# Patient Record
Sex: Female | Born: 1945 | Race: Black or African American | Hispanic: No | Smoking: Never smoker
Health system: Southern US, Community
[De-identification: ages and names within clinical notes are randomized; demographics above are authoritative.]

## PROBLEM LIST (undated history)

## (undated) DIAGNOSIS — E119 Type 2 diabetes mellitus without complications: Secondary | ICD-10-CM

## (undated) DIAGNOSIS — I10 Essential (primary) hypertension: Secondary | ICD-10-CM

## (undated) DIAGNOSIS — J45909 Unspecified asthma, uncomplicated: Secondary | ICD-10-CM

## (undated) DIAGNOSIS — I2510 Atherosclerotic heart disease of native coronary artery without angina pectoris: Secondary | ICD-10-CM

## (undated) DIAGNOSIS — N189 Chronic kidney disease, unspecified: Secondary | ICD-10-CM

## (undated) DIAGNOSIS — D631 Anemia in chronic kidney disease: Secondary | ICD-10-CM

## (undated) HISTORY — PX: ABDOMINAL HYSTERECTOMY: SHX81

## (undated) HISTORY — PX: BACK SURGERY: SHX140

---

## 2020-06-07 DEATH — deceased

## 2021-04-08 ENCOUNTER — Other Ambulatory Visit: Payer: Self-pay

## 2021-04-08 ENCOUNTER — Emergency Department (HOSPITAL_COMMUNITY): Payer: Medicare Other

## 2021-04-08 ENCOUNTER — Encounter (HOSPITAL_COMMUNITY): Payer: Self-pay

## 2021-04-08 ENCOUNTER — Inpatient Hospital Stay (HOSPITAL_COMMUNITY)
Admission: EM | Admit: 2021-04-08 | Discharge: 2021-04-13 | DRG: 291 | Disposition: A | Discharge status: Skilled Nursing Facility | Payer: Medicare Other | Attending: Family Medicine | Admitting: Family Medicine

## 2021-04-08 ENCOUNTER — Inpatient Hospital Stay (HOSPITAL_COMMUNITY): Payer: Medicare Other

## 2021-04-08 DIAGNOSIS — Z79899 Other long term (current) drug therapy: Secondary | ICD-10-CM

## 2021-04-08 DIAGNOSIS — I63421 Cerebral infarction due to embolism of right anterior cerebral artery: Secondary | ICD-10-CM | POA: Diagnosis not present

## 2021-04-08 DIAGNOSIS — R471 Dysarthria and anarthria: Secondary | ICD-10-CM | POA: Diagnosis present

## 2021-04-08 DIAGNOSIS — R54 Age-related physical debility: Secondary | ICD-10-CM | POA: Diagnosis present

## 2021-04-08 DIAGNOSIS — Z7901 Long term (current) use of anticoagulants: Secondary | ICD-10-CM

## 2021-04-08 DIAGNOSIS — W19XXXA Unspecified fall, initial encounter: Secondary | ICD-10-CM | POA: Diagnosis present

## 2021-04-08 DIAGNOSIS — D631 Anemia in chronic kidney disease: Secondary | ICD-10-CM | POA: Diagnosis present

## 2021-04-08 DIAGNOSIS — L89152 Pressure ulcer of sacral region, stage 2: Secondary | ICD-10-CM | POA: Diagnosis present

## 2021-04-08 DIAGNOSIS — I132 Hypertensive heart and chronic kidney disease with heart failure and with stage 5 chronic kidney disease, or end stage renal disease: Principal | ICD-10-CM | POA: Diagnosis present

## 2021-04-08 DIAGNOSIS — N184 Chronic kidney disease, stage 4 (severe): Secondary | ICD-10-CM

## 2021-04-08 DIAGNOSIS — D509 Iron deficiency anemia, unspecified: Secondary | ICD-10-CM | POA: Diagnosis present

## 2021-04-08 DIAGNOSIS — R1312 Dysphagia, oropharyngeal phase: Secondary | ICD-10-CM | POA: Diagnosis not present

## 2021-04-08 DIAGNOSIS — I5043 Acute on chronic combined systolic (congestive) and diastolic (congestive) heart failure: Secondary | ICD-10-CM | POA: Diagnosis present

## 2021-04-08 DIAGNOSIS — E872 Acidosis, unspecified: Secondary | ICD-10-CM | POA: Diagnosis present

## 2021-04-08 DIAGNOSIS — E1151 Type 2 diabetes mellitus with diabetic peripheral angiopathy without gangrene: Secondary | ICD-10-CM

## 2021-04-08 DIAGNOSIS — D6859 Other primary thrombophilia: Secondary | ICD-10-CM | POA: Diagnosis present

## 2021-04-08 DIAGNOSIS — I129 Hypertensive chronic kidney disease with stage 1 through stage 4 chronic kidney disease, or unspecified chronic kidney disease: Secondary | ICD-10-CM | POA: Diagnosis present

## 2021-04-08 DIAGNOSIS — N189 Chronic kidney disease, unspecified: Secondary | ICD-10-CM

## 2021-04-08 DIAGNOSIS — Z531 Procedure and treatment not carried out because of patient's decision for reasons of belief and group pressure: Secondary | ICD-10-CM | POA: Diagnosis present

## 2021-04-08 DIAGNOSIS — R0602 Shortness of breath: Secondary | ICD-10-CM

## 2021-04-08 DIAGNOSIS — I5031 Acute diastolic (congestive) heart failure: Secondary | ICD-10-CM | POA: Diagnosis not present

## 2021-04-08 DIAGNOSIS — E669 Obesity, unspecified: Secondary | ICD-10-CM | POA: Diagnosis present

## 2021-04-08 DIAGNOSIS — E785 Hyperlipidemia, unspecified: Secondary | ICD-10-CM | POA: Diagnosis present

## 2021-04-08 DIAGNOSIS — I509 Heart failure, unspecified: Secondary | ICD-10-CM | POA: Diagnosis not present

## 2021-04-08 DIAGNOSIS — R4182 Altered mental status, unspecified: Secondary | ICD-10-CM | POA: Diagnosis present

## 2021-04-08 DIAGNOSIS — D6869 Other thrombophilia: Secondary | ICD-10-CM

## 2021-04-08 DIAGNOSIS — E8770 Fluid overload, unspecified: Secondary | ICD-10-CM | POA: Diagnosis present

## 2021-04-08 DIAGNOSIS — N179 Acute kidney failure, unspecified: Secondary | ICD-10-CM | POA: Diagnosis present

## 2021-04-08 DIAGNOSIS — Z6833 Body mass index (BMI) 33.0-33.9, adult: Secondary | ICD-10-CM

## 2021-04-08 DIAGNOSIS — R2681 Unsteadiness on feet: Secondary | ICD-10-CM | POA: Diagnosis present

## 2021-04-08 DIAGNOSIS — G9341 Metabolic encephalopathy: Secondary | ICD-10-CM | POA: Diagnosis present

## 2021-04-08 DIAGNOSIS — Z20822 Contact with and (suspected) exposure to covid-19: Secondary | ICD-10-CM | POA: Diagnosis present

## 2021-04-08 DIAGNOSIS — J90 Pleural effusion, not elsewhere classified: Secondary | ICD-10-CM | POA: Diagnosis present

## 2021-04-08 DIAGNOSIS — I480 Paroxysmal atrial fibrillation: Secondary | ICD-10-CM | POA: Diagnosis present

## 2021-04-08 DIAGNOSIS — R7989 Other specified abnormal findings of blood chemistry: Secondary | ICD-10-CM

## 2021-04-08 DIAGNOSIS — J811 Chronic pulmonary edema: Secondary | ICD-10-CM

## 2021-04-08 DIAGNOSIS — E039 Hypothyroidism, unspecified: Secondary | ICD-10-CM | POA: Diagnosis present

## 2021-04-08 DIAGNOSIS — Z9889 Other specified postprocedural states: Secondary | ICD-10-CM

## 2021-04-08 DIAGNOSIS — I482 Chronic atrial fibrillation, unspecified: Secondary | ICD-10-CM | POA: Diagnosis present

## 2021-04-08 DIAGNOSIS — R627 Adult failure to thrive: Secondary | ICD-10-CM | POA: Diagnosis present

## 2021-04-08 DIAGNOSIS — E1122 Type 2 diabetes mellitus with diabetic chronic kidney disease: Secondary | ICD-10-CM | POA: Diagnosis present

## 2021-04-08 DIAGNOSIS — I5023 Acute on chronic systolic (congestive) heart failure: Secondary | ICD-10-CM

## 2021-04-08 DIAGNOSIS — L8990 Pressure ulcer of unspecified site, unspecified stage: Secondary | ICD-10-CM | POA: Insufficient documentation

## 2021-04-08 DIAGNOSIS — E860 Dehydration: Secondary | ICD-10-CM | POA: Diagnosis present

## 2021-04-08 DIAGNOSIS — J45909 Unspecified asthma, uncomplicated: Secondary | ICD-10-CM | POA: Diagnosis present

## 2021-04-08 DIAGNOSIS — G8194 Hemiplegia, unspecified affecting left nondominant side: Secondary | ICD-10-CM | POA: Diagnosis not present

## 2021-04-08 DIAGNOSIS — G4700 Insomnia, unspecified: Secondary | ICD-10-CM | POA: Diagnosis present

## 2021-04-08 DIAGNOSIS — Z7189 Other specified counseling: Secondary | ICD-10-CM | POA: Diagnosis not present

## 2021-04-08 DIAGNOSIS — N185 Chronic kidney disease, stage 5: Secondary | ICD-10-CM | POA: Diagnosis present

## 2021-04-08 DIAGNOSIS — J9601 Acute respiratory failure with hypoxia: Secondary | ICD-10-CM

## 2021-04-08 DIAGNOSIS — Z8673 Personal history of transient ischemic attack (TIA), and cerebral infarction without residual deficits: Secondary | ICD-10-CM

## 2021-04-08 DIAGNOSIS — I160 Hypertensive urgency: Secondary | ICD-10-CM | POA: Diagnosis present

## 2021-04-08 DIAGNOSIS — Z515 Encounter for palliative care: Secondary | ICD-10-CM | POA: Diagnosis not present

## 2021-04-08 DIAGNOSIS — Z955 Presence of coronary angioplasty implant and graft: Secondary | ICD-10-CM

## 2021-04-08 DIAGNOSIS — I2510 Atherosclerotic heart disease of native coronary artery without angina pectoris: Secondary | ICD-10-CM | POA: Diagnosis present

## 2021-04-08 DIAGNOSIS — Z794 Long term (current) use of insulin: Secondary | ICD-10-CM

## 2021-04-08 DIAGNOSIS — Z823 Family history of stroke: Secondary | ICD-10-CM

## 2021-04-08 DIAGNOSIS — R29706 NIHSS score 6: Secondary | ICD-10-CM | POA: Diagnosis not present

## 2021-04-08 DIAGNOSIS — E8809 Other disorders of plasma-protein metabolism, not elsewhere classified: Secondary | ICD-10-CM | POA: Diagnosis present

## 2021-04-08 HISTORY — DX: Essential (primary) hypertension: I10

## 2021-04-08 HISTORY — DX: Chronic kidney disease, unspecified: N18.9

## 2021-04-08 HISTORY — DX: Type 2 diabetes mellitus without complications: E11.9

## 2021-04-08 HISTORY — DX: Anemia in chronic kidney disease: D63.1

## 2021-04-08 HISTORY — DX: Atherosclerotic heart disease of native coronary artery without angina pectoris: I25.10

## 2021-04-08 HISTORY — DX: Unspecified asthma, uncomplicated: J45.909

## 2021-04-08 LAB — CBC WITH DIFFERENTIAL/PLATELET
Abs Immature Granulocytes: 0.01 10*3/uL (ref 0.00–0.07)
Basophils Absolute: 0 10*3/uL (ref 0.0–0.1)
Basophils Relative: 0 %
Eosinophils Absolute: 0.1 10*3/uL (ref 0.0–0.5)
Eosinophils Relative: 2 %
HCT: 28.8 % — ABNORMAL LOW (ref 36.0–46.0)
Hemoglobin: 8.3 g/dL — ABNORMAL LOW (ref 12.0–15.0)
Immature Granulocytes: 0 %
Lymphocytes Relative: 27 %
Lymphs Abs: 1.5 10*3/uL (ref 0.7–4.0)
MCH: 26.7 pg (ref 26.0–34.0)
MCHC: 28.8 g/dL — ABNORMAL LOW (ref 30.0–36.0)
MCV: 92.6 fL (ref 80.0–100.0)
Monocytes Absolute: 0.3 10*3/uL (ref 0.1–1.0)
Monocytes Relative: 6 %
Neutro Abs: 3.6 10*3/uL (ref 1.7–7.7)
Neutrophils Relative %: 65 %
Platelets: 153 10*3/uL (ref 150–400)
RBC: 3.11 MIL/uL — ABNORMAL LOW (ref 3.87–5.11)
RDW: 17.9 % — ABNORMAL HIGH (ref 11.5–15.5)
WBC: 5.5 10*3/uL (ref 4.0–10.5)
nRBC: 0 % (ref 0.0–0.2)

## 2021-04-08 LAB — COMPREHENSIVE METABOLIC PANEL
ALT: 27 U/L (ref 0–44)
AST: 24 U/L (ref 15–41)
Albumin: 2.7 g/dL — ABNORMAL LOW (ref 3.5–5.0)
Alkaline Phosphatase: 83 U/L (ref 38–126)
Anion gap: 7 (ref 5–15)
BUN: 50 mg/dL — ABNORMAL HIGH (ref 8–23)
CO2: 18 mmol/L — ABNORMAL LOW (ref 22–32)
Calcium: 7.6 mg/dL — ABNORMAL LOW (ref 8.9–10.3)
Chloride: 111 mmol/L (ref 98–111)
Creatinine, Ser: 6.41 mg/dL — ABNORMAL HIGH (ref 0.44–1.00)
GFR, Estimated: 6 mL/min — ABNORMAL LOW (ref >60)
Glucose, Bld: 192 mg/dL — ABNORMAL HIGH (ref 70–99)
Potassium: 3.7 mmol/L (ref 3.5–5.1)
Sodium: 136 mmol/L (ref 135–145)
Total Bilirubin: 0.6 mg/dL (ref 0.3–1.2)
Total Protein: 7.8 g/dL (ref 6.5–8.1)

## 2021-04-08 LAB — RESP PANEL BY RT-PCR (FLU A&B, COVID) ARPGX2
Influenza A by PCR: NEGATIVE
Influenza B by PCR: NEGATIVE
SARS Coronavirus 2 by RT PCR: NEGATIVE

## 2021-04-08 LAB — IRON AND TIBC
Iron: 24 ug/dL — ABNORMAL LOW (ref 28–170)
Saturation Ratios: 13 % (ref 10.4–31.8)
TIBC: 180 ug/dL — ABNORMAL LOW (ref 250–450)
UIBC: 156 ug/dL

## 2021-04-08 LAB — GLUCOSE, CAPILLARY: Glucose-Capillary: 180 mg/dL — ABNORMAL HIGH (ref 70–99)

## 2021-04-08 LAB — URINALYSIS, ROUTINE W REFLEX MICROSCOPIC
Bilirubin Urine: NEGATIVE
Glucose, UA: 150 mg/dL — AB
Ketones, ur: NEGATIVE mg/dL
Leukocytes,Ua: NEGATIVE
Nitrite: NEGATIVE
Protein, ur: >=300 mg/dL — AB
Specific Gravity, Urine: 1.014 (ref 1.005–1.030)
pH: 6 (ref 5.0–8.0)

## 2021-04-08 LAB — FERRITIN: Ferritin: 198 ng/mL (ref 11–307)

## 2021-04-08 LAB — RETICULOCYTES
Immature Retic Fract: 12.6 % (ref 2.3–15.9)
RBC.: 3.13 MIL/uL — ABNORMAL LOW (ref 3.87–5.11)
Retic Count, Absolute: 51 10*3/uL (ref 19.0–186.0)
Retic Ct Pct: 1.6 % (ref 0.4–3.1)

## 2021-04-08 LAB — T4, FREE: Free T4: 1.02 ng/dL (ref 0.61–1.12)

## 2021-04-08 LAB — VITAMIN B12: Vitamin B-12: 939 pg/mL — ABNORMAL HIGH (ref 180–914)

## 2021-04-08 LAB — FOLATE: Folate: 10.7 ng/mL (ref >5.9)

## 2021-04-08 LAB — CBG MONITORING, ED: Glucose-Capillary: 121 mg/dL — ABNORMAL HIGH (ref 70–99)

## 2021-04-08 LAB — TROPONIN I (HIGH SENSITIVITY): Troponin I (High Sensitivity): 14 ng/L (ref <18)

## 2021-04-08 LAB — VITAMIN D 25 HYDROXY (VIT D DEFICIENCY, FRACTURES): Vit D, 25-Hydroxy: 104.26 ng/mL — ABNORMAL HIGH (ref 30–100)

## 2021-04-08 LAB — TSH: TSH: 9.838 u[IU]/mL — ABNORMAL HIGH (ref 0.350–4.500)

## 2021-04-08 LAB — BRAIN NATRIURETIC PEPTIDE: B Natriuretic Peptide: 2269 pg/mL — ABNORMAL HIGH (ref 0.0–100.0)

## 2021-04-08 IMAGING — DX DG CHEST 1V
1 series · 1 of 1 positions shown · non-contrast
Comparison: None.

CLINICAL DATA: Fell this morning. Low O2 sats. Confusion for 2
days.

EXAM:
CHEST  1 VIEW

[chest ap]
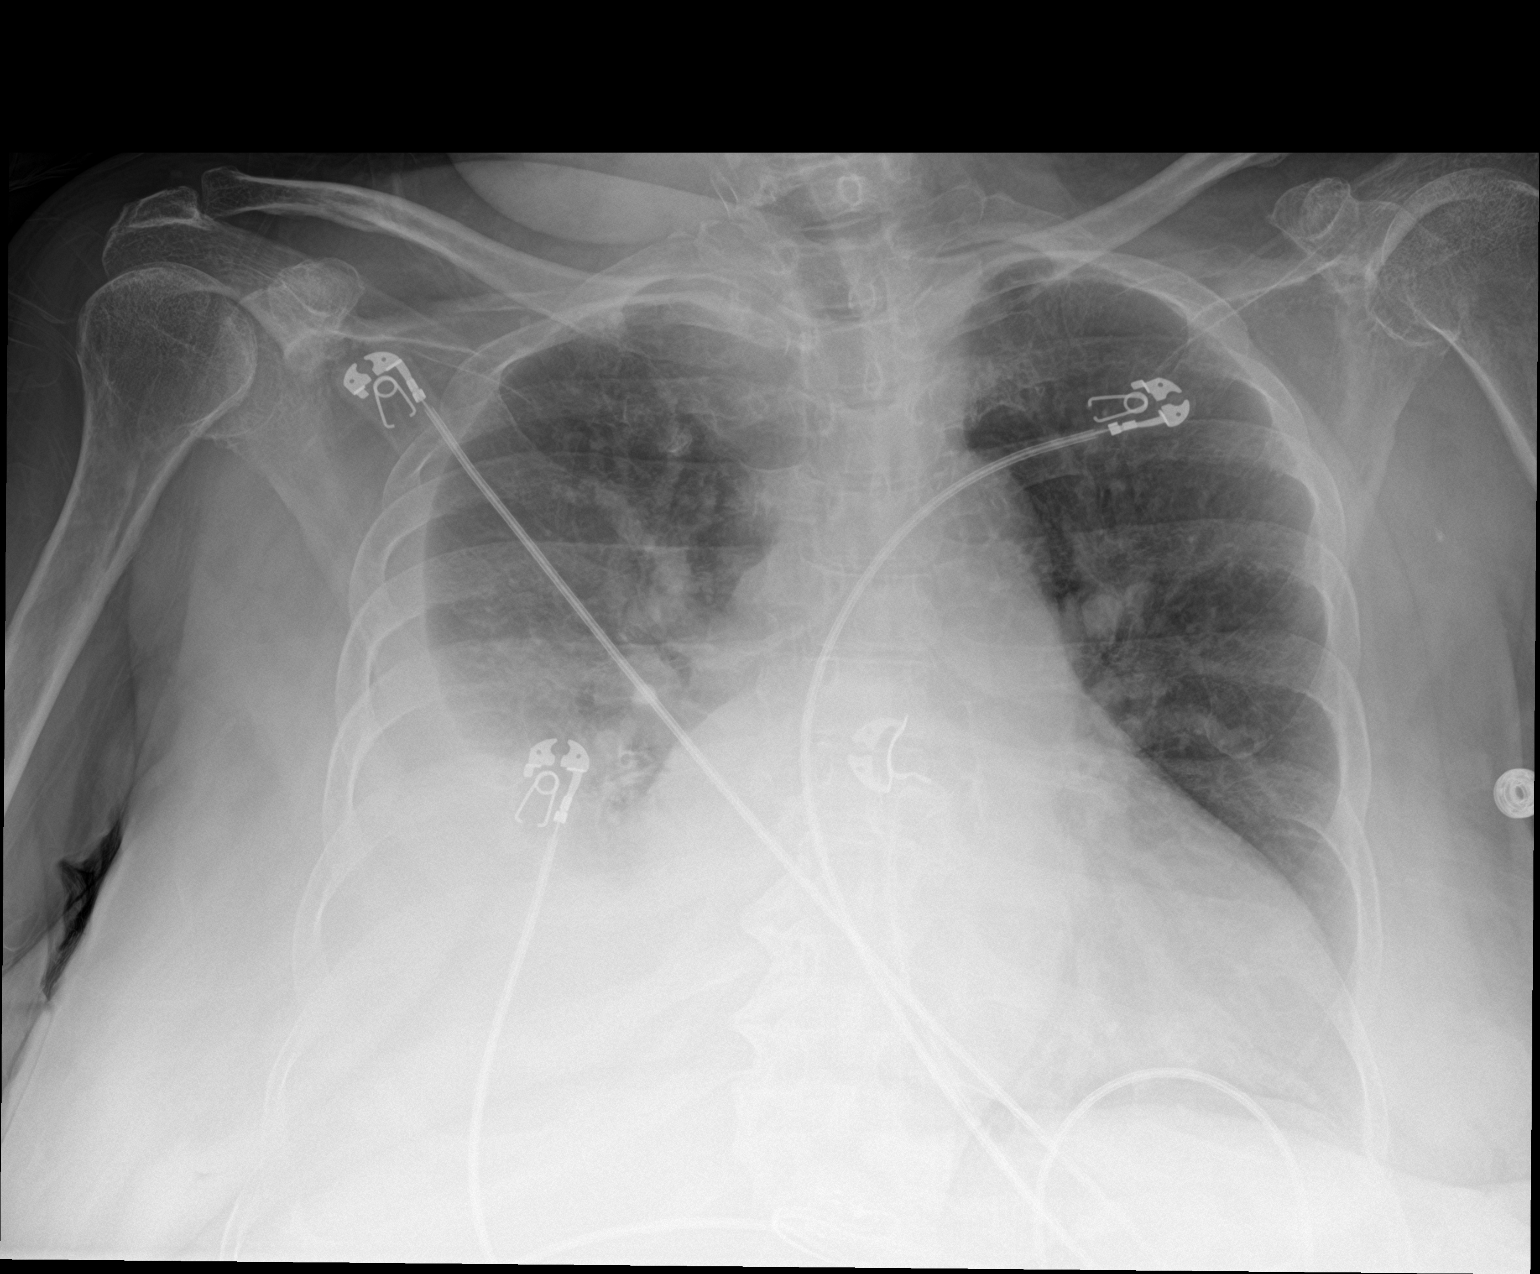

[1 of 1 positions shown; findings below may reference images not displayed]

FINDINGS: Heart is enlarged but also accentuated by technique. Moderate RIGHT
pleural effusion is noted, with opacification of the RIGHT lung base
which obscures the RIGHT hemidiaphragm. Pulmonary vascular
congestion is present. LEFT lung is otherwise clear. No
pneumothorax. No acute displaced fractures.
IMPRESSION: Cardiomegaly and pulmonary vascular congestion.

RIGHT pleural effusion and RIGHT LOWER lobe opacity consistent with
atelectasis or infiltrate.

## 2021-04-08 IMAGING — CT CT HEAD W/O CM
3 series · 16 of 47 positions shown, 19 images · non-contrast
Comparison: None.

CLINICAL DATA: Mental status change.

EXAM:
CT HEAD WITHOUT CONTRAST
TECHNIQUE: Contiguous axial images were obtained from the base of the skull
through the vertex without intravenous contrast.

[Series 2: head w o · axial · 0.43mm/px · z∈[-76,+49]mm · 10 of 31 slices shown, 13 images]
[im 3/31  brain]
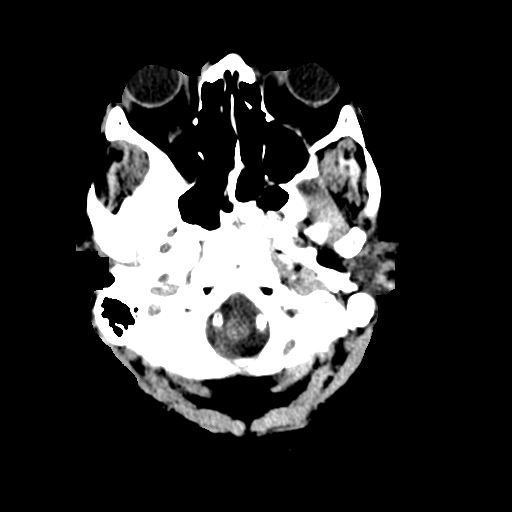
[im 3/31  bone]
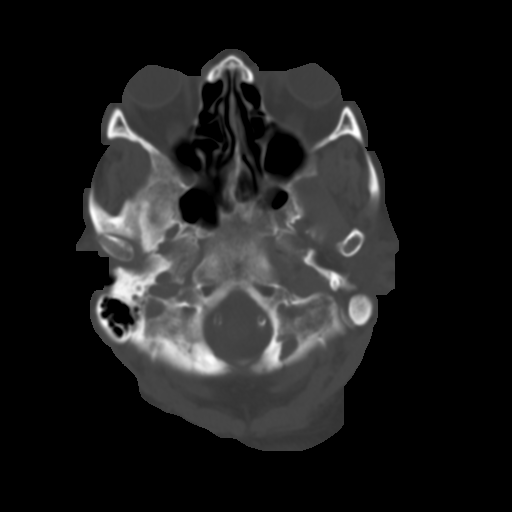
[im 6/31  brain]
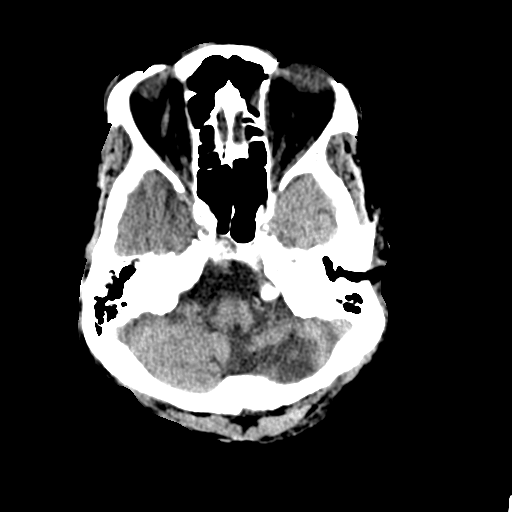
[im 9/31  brain]
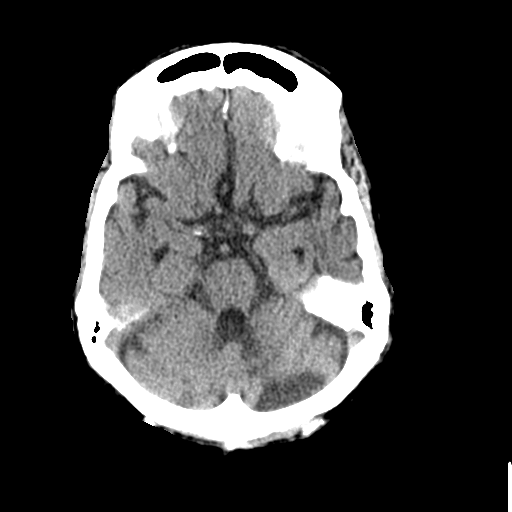
[im 11/31  brain]
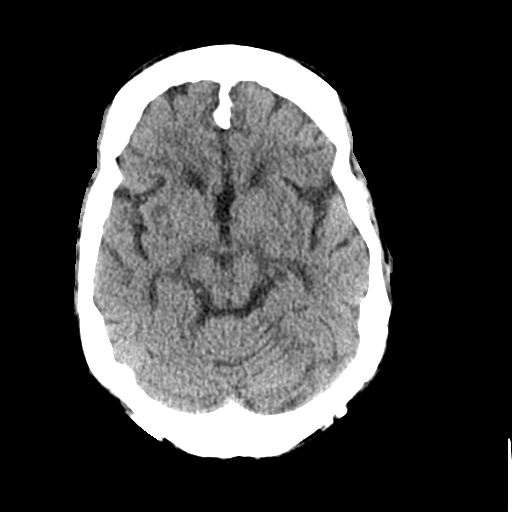
[im 14/31  brain]
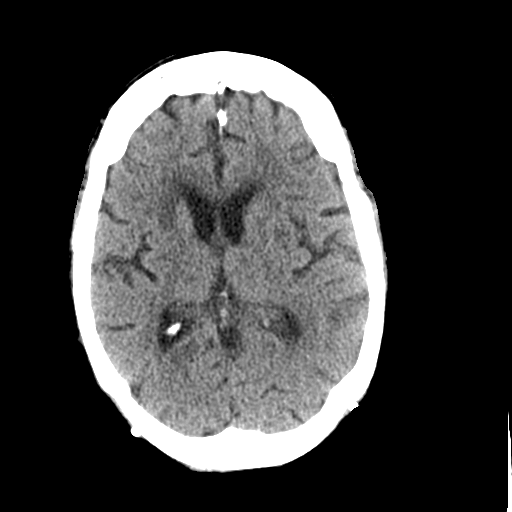
[im 14/31  bone]
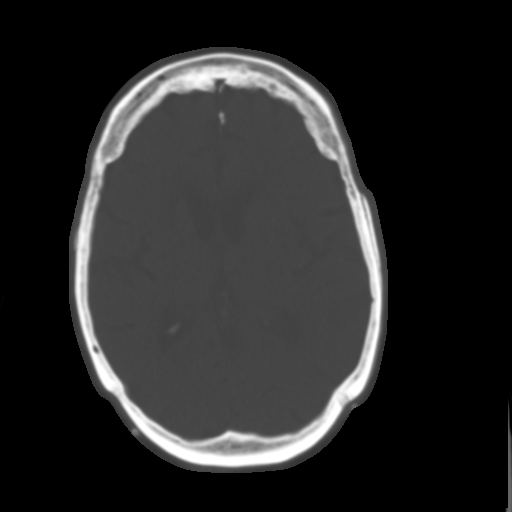
[im 17/31  brain]
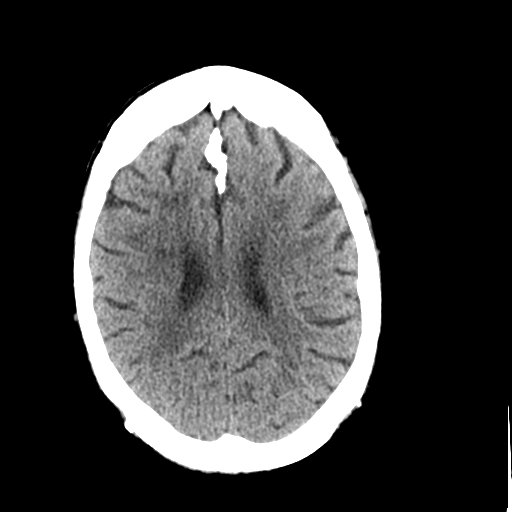
[im 20/31  brain]
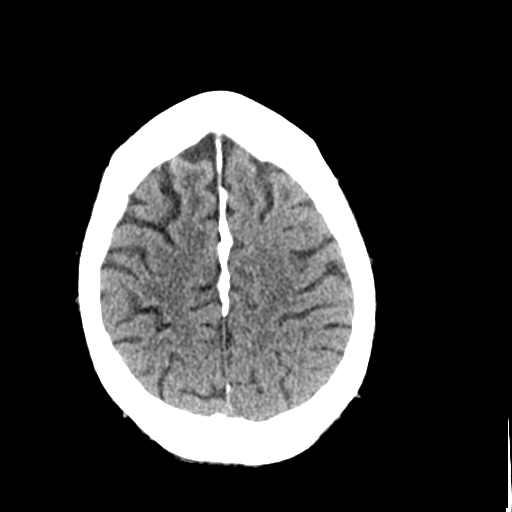
[im 23/31  brain]
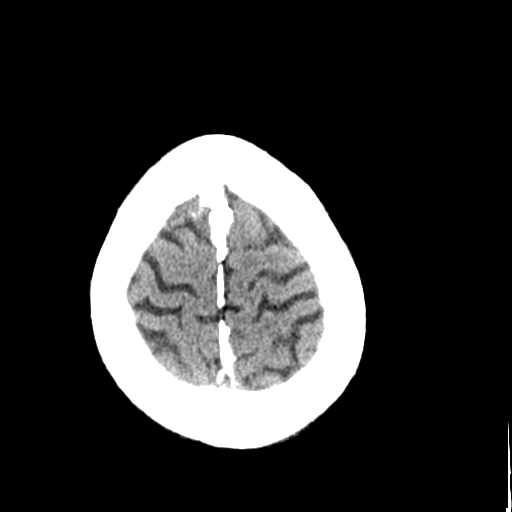
[im 25/31  brain]
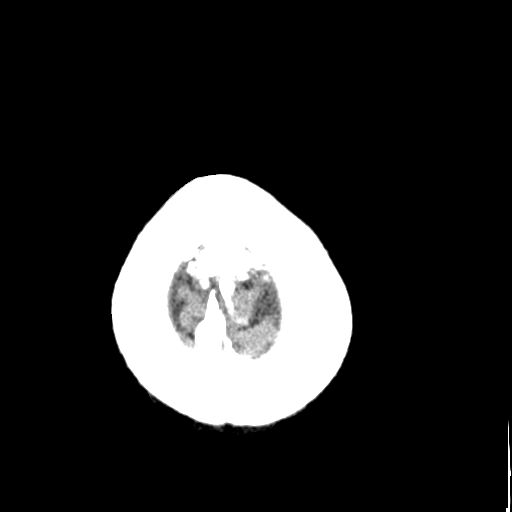
[im 25/31  bone]
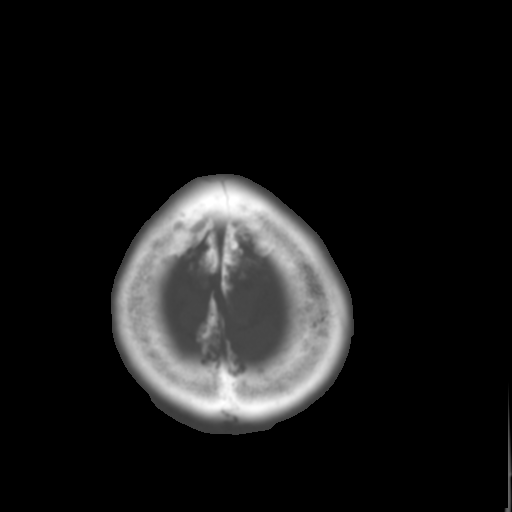
[im 28/31  brain]
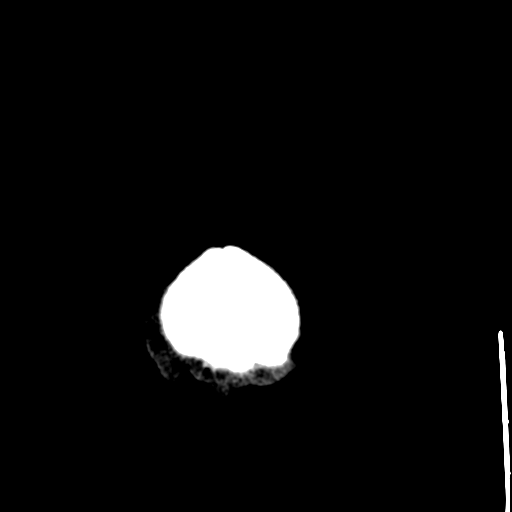

[Series 4: coronal soft · coronal · 0.30mm/px · 3 of 67 slices shown]
[im 23/67  brain]
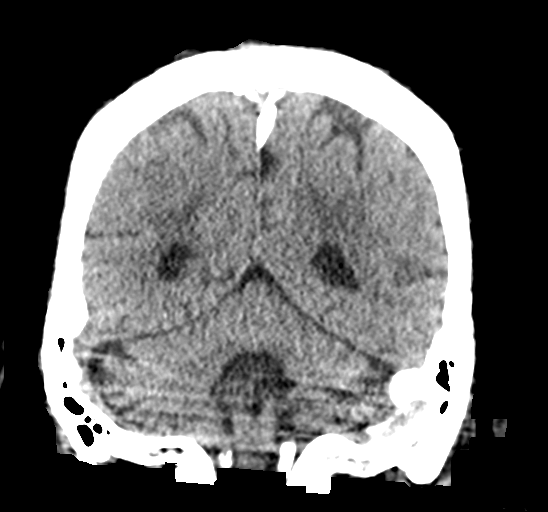
[im 30/67  brain]
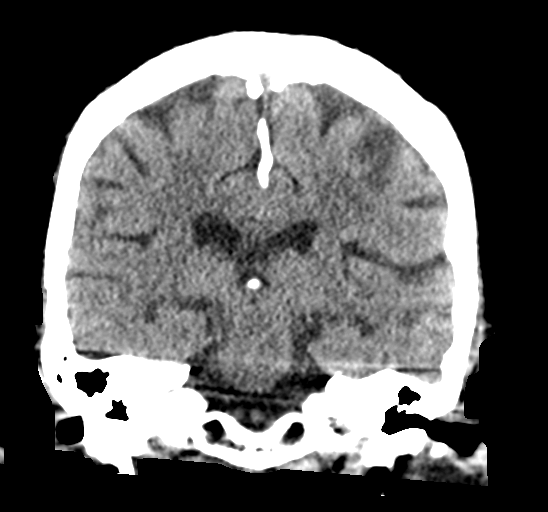
[im 37/67  brain]
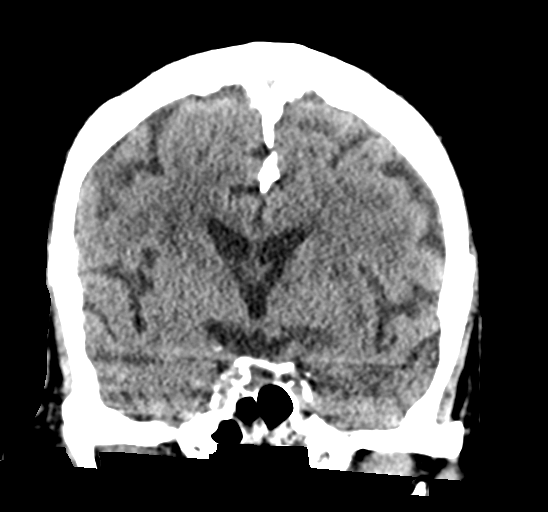

[Series 5: sagittal soft · sagittal · 0.33mm/px · 3 of 55 slices shown]
[im 19/55  brain]
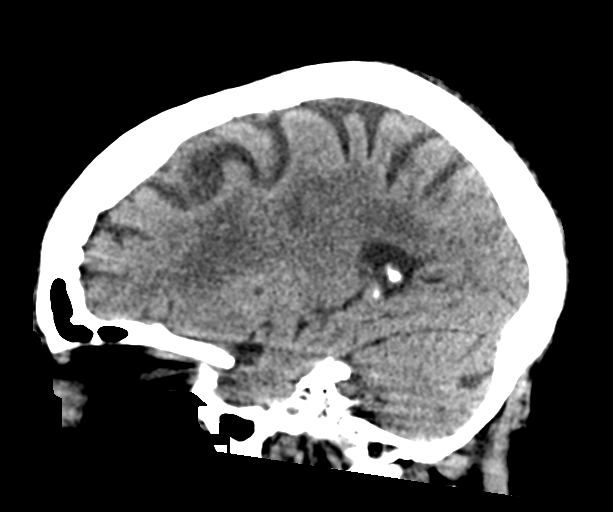
[im 28/55  brain]
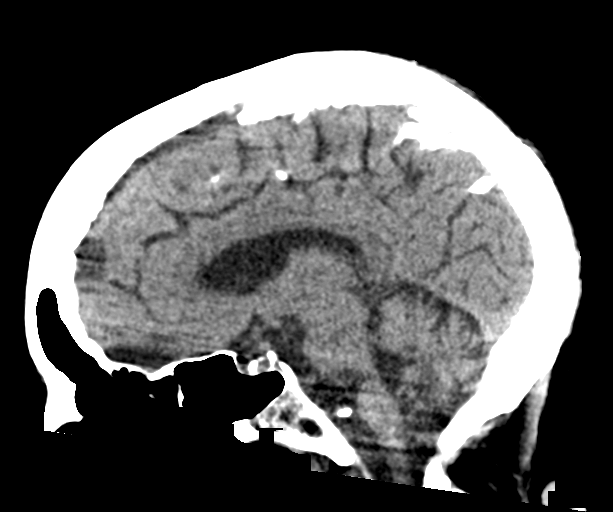
[im 37/55  brain]
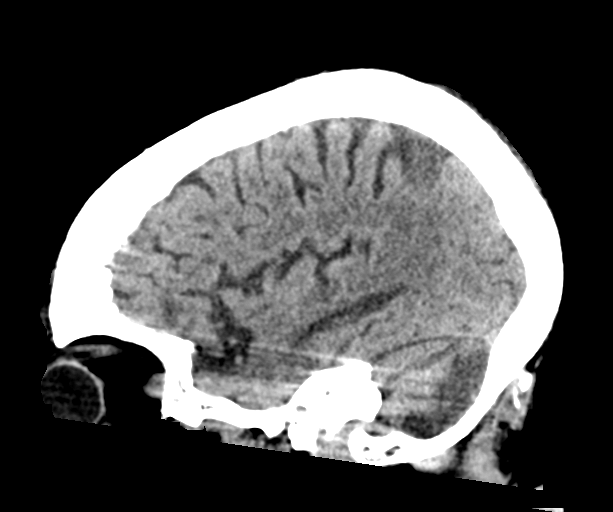

[16 of 47 positions shown; findings below may reference images not displayed]

FINDINGS: Brain: Ventricles and sulci are prominent compatible with atrophy.
Periventricular and subcortical white matter hypodensities
compatible with chronic microvascular ischemic changes.
Low-attenuation throughout the majority of the left cerebellar
hemisphere. Focal chronic infarct within the right cerebellar
hemisphere. No evidence for acute intracranial hemorrhage or
significant mass effect.

Vascular: Unremarkable.

Skull: Intact.

Sinuses/Orbits: Paranasal sinuses are well aerated. Mastoid air
cells are unremarkable.

Other: None.
IMPRESSION: Low-attenuation within the majority of the left cerebellar
hemisphere may represent subacute to chronic infarct, in the absence
of priors. Consider further evaluation with MRI as clinically
indicated.

Atrophy and chronic microvascular ischemic changes.

## 2021-04-08 MED ORDER — MIRTAZAPINE 15 MG PO TABS
7.5000 mg | ORAL_TABLET | Freq: Every day | ORAL | Status: DC
  Administered 2021-04-08 – 2021-04-12 (×5): 7.5 mg via ORAL
  Filled 2021-04-08 (×5): qty 1

## 2021-04-08 MED ORDER — HEPARIN SODIUM (PORCINE) 5000 UNIT/ML IJ SOLN: 5000.0000 [IU] | Freq: Three times a day (TID) | INTRAMUSCULAR | Status: DC

## 2021-04-08 MED ORDER — ALBUTEROL SULFATE (2.5 MG/3ML) 0.083% IN NEBU: 2.5000 mg | INHALATION_SOLUTION | RESPIRATORY_TRACT | Status: DC | PRN

## 2021-04-08 MED ORDER — INSULIN GLARGINE 100 UNIT/ML SOLOSTAR PEN: 8.0000 [IU] | PEN_INJECTOR | Freq: Every day | SUBCUTANEOUS | Status: DC

## 2021-04-08 MED ORDER — ALBUTEROL SULFATE HFA 108 (90 BASE) MCG/ACT IN AERS
1.0000 | INHALATION_SPRAY | RESPIRATORY_TRACT | Status: DC | PRN
Start: 2021-04-08 — End: 2021-04-08

## 2021-04-08 MED ORDER — HYDRALAZINE HCL 20 MG/ML IJ SOLN
10.0000 mg | INTRAMUSCULAR | Status: DC | PRN
  Administered 2021-04-08: 10 mg via INTRAVENOUS
  Filled 2021-04-08: qty 1

## 2021-04-08 MED ORDER — BISACODYL 5 MG PO TBEC: 5.0000 mg | DELAYED_RELEASE_TABLET | Freq: Every day | ORAL | Status: DC | PRN

## 2021-04-08 MED ORDER — AMLODIPINE BESYLATE 5 MG PO TABS
10.0000 mg | ORAL_TABLET | Freq: Every day | ORAL | Status: DC
  Administered 2021-04-08 – 2021-04-09 (×2): 10 mg via ORAL
  Filled 2021-04-08 (×2): qty 2

## 2021-04-08 MED ORDER — ACETAMINOPHEN 325 MG PO TABS: 650.0000 mg | ORAL_TABLET | Freq: Four times a day (QID) | ORAL | Status: DC | PRN

## 2021-04-08 MED ORDER — APIXABAN 2.5 MG PO TABS
2.5000 mg | ORAL_TABLET | Freq: Two times a day (BID) | ORAL | Status: DC
  Administered 2021-04-08: 2.5 mg via ORAL
  Filled 2021-04-08: qty 1

## 2021-04-08 MED ORDER — ISOSORBIDE MONONITRATE ER 60 MG PO TB24
60.0000 mg | ORAL_TABLET | Freq: Every day | ORAL | Status: DC
  Administered 2021-04-08 – 2021-04-09 (×2): 60 mg via ORAL
  Filled 2021-04-08 (×2): qty 1

## 2021-04-08 MED ORDER — FUROSEMIDE 10 MG/ML IJ SOLN
40.0000 mg | Freq: Once | INTRAMUSCULAR | Status: AC
  Administered 2021-04-08: 40 mg via INTRAVENOUS
  Filled 2021-04-08: qty 4

## 2021-04-08 MED ORDER — CLONIDINE HCL 0.1 MG PO TABS: 0.1000 mg | ORAL_TABLET | Freq: Three times a day (TID) | ORAL | Status: DC

## 2021-04-08 MED ORDER — OXYCODONE HCL 5 MG PO TABS
2.5000 mg | ORAL_TABLET | Freq: Four times a day (QID) | ORAL | Status: DC | PRN
  Administered 2021-04-08 – 2021-04-12 (×4): 2.5 mg via ORAL
  Filled 2021-04-08 (×4): qty 1

## 2021-04-08 MED ORDER — METOPROLOL SUCCINATE ER 50 MG PO TB24
100.0000 mg | ORAL_TABLET | Freq: Every day | ORAL | Status: DC
  Administered 2021-04-09: 100 mg via ORAL
  Filled 2021-04-08: qty 2

## 2021-04-08 MED ORDER — INSULIN GLARGINE-YFGN 100 UNIT/ML ~~LOC~~ SOLN
8.0000 [IU] | Freq: Every day | SUBCUTANEOUS | Status: DC
  Administered 2021-04-08: 8 [IU] via SUBCUTANEOUS
  Filled 2021-04-08 (×2): qty 0.08

## 2021-04-08 MED ORDER — ONDANSETRON HCL 4 MG/2ML IJ SOLN: 4.0000 mg | Freq: Four times a day (QID) | INTRAMUSCULAR | Status: DC | PRN

## 2021-04-08 MED ORDER — PANTOPRAZOLE SODIUM 40 MG PO TBEC
40.0000 mg | DELAYED_RELEASE_TABLET | Freq: Every day | ORAL | Status: DC
  Administered 2021-04-08 – 2021-04-13 (×6): 40 mg via ORAL
  Filled 2021-04-08 (×6): qty 1

## 2021-04-08 MED ORDER — INSULIN ASPART 100 UNIT/ML IJ SOLN
0.0000 [IU] | Freq: Three times a day (TID) | INTRAMUSCULAR | Status: DC
  Administered 2021-04-10 – 2021-04-13 (×2): 1 [IU] via SUBCUTANEOUS

## 2021-04-08 MED ORDER — TRAZODONE HCL 50 MG PO TABS
25.0000 mg | ORAL_TABLET | Freq: Every evening | ORAL | Status: DC | PRN
  Administered 2021-04-08 – 2021-04-12 (×4): 25 mg via ORAL
  Filled 2021-04-08 (×4): qty 1

## 2021-04-08 MED ORDER — FUROSEMIDE 10 MG/ML IJ SOLN
40.0000 mg | Freq: Two times a day (BID) | INTRAMUSCULAR | Status: DC
  Administered 2021-04-08 – 2021-04-10 (×4): 40 mg via INTRAVENOUS
  Filled 2021-04-08 (×3): qty 4

## 2021-04-08 MED ORDER — SODIUM BICARBONATE 650 MG PO TABS
650.0000 mg | ORAL_TABLET | Freq: Three times a day (TID) | ORAL | Status: DC
  Administered 2021-04-08 – 2021-04-13 (×12): 650 mg via ORAL
  Filled 2021-04-08 (×13): qty 1

## 2021-04-08 MED ORDER — CLONIDINE HCL 0.2 MG PO TABS
0.2000 mg | ORAL_TABLET | Freq: Three times a day (TID) | ORAL | Status: DC
  Administered 2021-04-08 – 2021-04-09 (×2): 0.2 mg via ORAL
  Filled 2021-04-08 (×2): qty 1

## 2021-04-08 MED ORDER — ONDANSETRON HCL 4 MG PO TABS: 4.0000 mg | ORAL_TABLET | Freq: Four times a day (QID) | ORAL | Status: DC | PRN

## 2021-04-08 MED ORDER — ATORVASTATIN CALCIUM 40 MG PO TABS
40.0000 mg | ORAL_TABLET | Freq: Every day | ORAL | Status: DC
  Administered 2021-04-08 – 2021-04-13 (×6): 40 mg via ORAL
  Filled 2021-04-08 (×6): qty 1

## 2021-04-08 MED ORDER — ACETAMINOPHEN 650 MG RE SUPP: 650.0000 mg | Freq: Four times a day (QID) | RECTAL | Status: DC | PRN

## 2021-04-08 NOTE — H&P (Addendum)
History and Physical  [facility]  Tiffany Valencia [id number] DOB: 17-May-1946 DOA: 04/08/2021  PCP: Pcp, No  NO LOCAL PROVIDERS AS OF 04/08/2021 (RELOCATED FROM [place])  Patient coming from: [facility]   Level of care: Telemetry  I have personally briefly reviewed patient's old medical records in [place]  Chief Complaint: edema in feet and legs  Historian(s): Pt is poor historian.  No family member present. Information taken from patient and ED provider and EMS records.    HPI: Tiffany Valencia is a 75 y.o. female who was recently relocated from [place] to [facility] via family member in early September 2022 has apparently been living with a family member for the last month.  According to what was reported, a family member went to visit the patient at her apartment in [place] in early September 2022 and found the patient on the floor covered in feces and urine.  The patient was cleaned up at that time and brought to [facility] to live.  The patient reportedly was never taken to the emergency room or to a physician or care provider until now.  Reportedly, family member left patient with husband for past couple of days while out of town and returned to find the patient with altered mentation and increasing edema in the feet and legs up to the waist.  The patient also seemed more confused than normal and having more difficulty ambulating.  EMS found patient to have a pulse ox of 83% on room air and placed on 4L/min [place] with improvement to 96%.   No prior medical records could be found.  The patient reportedly has a history of coronary artery disease status post stent placement in 2013.  The patient has hyperlipidemia, hypertension, chronic kidney disease and had been seen by her nephrologist and started on sodium bicarbonate tablets for metabolic acidosis.  The patient presumably has a history of paroxysmal atrial fibrillation being on apixaban 2.5 mg  twice daily and metoprolol succinate 200 mg daily.  The patient also has anemia of chronic kidney disease and taking an iron tablet daily.  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  The patient has gait instability and reportedly has fallen at home with difficulty ambulating.  ED Course: 97.8, 57, 16, 184/71, 96%, sodium 136, potassium 3.7, chloride 111, CO2 18, glucose 192, BUN 50, creatinine 6.41, calcium 7.6, albumin 2.7, total bilirubin 0.6, cardiac BNP 2269, high-sensitivity troponin 14, WBC 5.5, hemoglobin 8.3, platelet count 153.  TSH 9.838.  Chest x-ray with findings of a large right pleural effusion and opacity in the right lower lobe.  Pulmonary vascular congestion noted.  Patient was started on IV Lasix and admission was requested.  Review of Systems: Review of Systems  Constitutional:  Positive for malaise/fatigue. Negative for chills, diaphoresis, fever and weight loss.  HENT:  Positive for hearing loss. Negative for congestion, nosebleeds, sinus pain and sore throat.   Eyes: Negative.   Respiratory:  Positive for cough and shortness of breath. Negative for wheezing.   Cardiovascular:  Positive for orthopnea, leg swelling and PND. Negative for chest pain.  Gastrointestinal:  Negative for blood in stool, heartburn, melena, nausea and vomiting.  Genitourinary:  Positive for dysuria and flank pain.  Musculoskeletal:  Positive for myalgias. Negative for back pain, joint pain and neck pain.  Skin: Negative.   Neurological:  Positive for dizziness and weakness. Negative for tingling, tremors, focal  weakness, seizures, loss of consciousness and headaches.  Endo/Heme/Allergies: Negative.   Psychiatric/Behavioral:  Positive for memory loss. Negative for hallucinations, substance abuse and suicidal ideas. The patient has insomnia.   All other systems reviewed and are negative.   Past Medical History:  Diagnosis Date   Anemia  in chronic kidney disease (CKD)    Asthma    Chronic kidney disease    Coronary artery disease    Diabetes mellitus without complication ([place])    Hypertension     Past Surgical History:  Procedure Laterality Date   ABDOMINAL HYSTERECTOMY     BACK SURGERY       reports that she has never smoked. She has never used smokeless tobacco. She reports that she does not currently use alcohol. She reports that she does not currently use drugs.  No Known Allergies  No family history on file.  Prior to Admission medications   Not on File    Physical Exam: Vitals:   04/08/21 1017 04/08/21 1021 04/08/21 1235 04/08/21 1300  BP:  (!) 184/71 (!) 178/74 (!) 181/79  Pulse:  (!) 57 (!) 58 (!) 59  Resp:  16 19 (!) 21  Temp:  97.8 F (36.6 C)    TempSrc:  Oral    SpO2:  96% 94% 95%  Weight: 86.2 kg     Height: 5' 3.5" (1.613 m)       Constitutional: fraile, elderly female, with dysarthria, NAD, calm, cooperative.  Eyes: PERRL, lids and conjunctivae normal ENMT: Mucous membranes are moist. Posterior pharynx clear of any exudate or lesions. Poor dentition.  Neck: mild thromegaly, no masses palpated, supple, no masses, no thyromegaly Respiratory: bibasilar crackles, diminished BS RLL, RML.   Cardiovascular: normal s1, s2 sounds, no murmurs / rubs / gallops. Mild JVD. 2+ pitting bilateral extremity edema. 2+ pedal pulses. No carotid bruits.  Abdomen: no tenderness, no masses palpated. No hepatosplenomegaly. Bowel sounds positive.  Musculoskeletal: no clubbing / cyanosis. No joint deformity upper and lower extremities. Good ROM, no contractures. Normal muscle tone.  Skin: Acanthosis nigricans of neck, skin tags around neck,  No induration Neurologic: CN 2-12 grossly intact. Sensation intact, DTR normal. Strength 5/5 in all 4.  Psychiatric: poor judgment and insight. Alert and oriented x 3. Normal mood.   Labs on Admission: I have personally reviewed following labs and imaging  studies  CBC: Recent Labs  Lab 04/08/21 1135  WBC 5.5  NEUTROABS 3.6  HGB 8.3*  HCT 28.8*  MCV 92.6  PLT 0000000   Basic Metabolic Panel: Recent Labs  Lab 04/08/21 1135  NA 136  K 3.7  CL 111  CO2 18*  GLUCOSE 192*  BUN 50*  CREATININE 6.41*  CALCIUM 7.6*   GFR: Estimated Creatinine Clearance: 7.9 mL/min (A) (by C-G formula based on SCr of 6.41 mg/dL (H)). Liver Function Tests: Recent Labs  Lab 04/08/21 1135  AST 24  ALT 27  ALKPHOS 83  BILITOT 0.6  PROT 7.8  ALBUMIN 2.7*   No results for input(s): LIPASE, AMYLASE in the last 168 hours. No results for input(s): AMMONIA in the last 168 hours. Coagulation Profile: No results for input(s): INR, PROTIME in the last 168 hours. Cardiac Enzymes: No results for input(s): CKTOTAL, CKMB, CKMBINDEX, TROPONINI in the last 168 hours. BNP (last 3 results) No results for input(s): PROBNP in the last 8760 hours. HbA1C: No results for input(s): HGBA1C in the last 72 hours. CBG: No results for input(s): GLUCAP in the last 168 hours. Lipid  Profile: No results for input(s): CHOL, HDL, LDLCALC, TRIG, CHOLHDL, LDLDIRECT in the last 72 hours. Thyroid Function Tests: No results for input(s): TSH, T4TOTAL, FREET4, T3FREE, THYROIDAB in the last 72 hours. Anemia Panel: Recent Labs    04/08/21 1135  RETICCTPCT 1.6   Urine analysis: No results found for: COLORURINE, APPEARANCEUR, LABSPEC, IXL, GLUCOSEU, HGBUR, BILIRUBINUR, [place], PROTEINUR, [place], NITRITE, LEUKOCYTESUR  Radiological Exams on Admission: DG Chest 1 View  Result Date: 04/08/2021 CLINICAL DATA:  [place] this morning. Low O2 sats. Confusion for 2 days. EXAM: CHEST  1 VIEW COMPARISON:  None. FINDINGS: Heart is enlarged but also accentuated by technique. Moderate RIGHT pleural effusion is noted, with opacification of the RIGHT lung base which obscures the RIGHT hemidiaphragm. Pulmonary vascular congestion is present. LEFT lung is otherwise clear. No  pneumothorax. No acute displaced fractures. IMPRESSION: Cardiomegaly and pulmonary vascular congestion. RIGHT pleural effusion and RIGHT LOWER lobe opacity consistent with atelectasis or infiltrate. Electronically Signed   By: Nolon Nations M.D.   On: 04/08/2021 12:30   CT Head Wo Contrast  Result Date: 04/08/2021 CLINICAL DATA:  Mental status change. EXAM: CT HEAD WITHOUT CONTRAST TECHNIQUE: Contiguous axial images were obtained from the base of the skull through the vertex without intravenous contrast. COMPARISON:  None. FINDINGS: Brain: Ventricles and sulci are prominent compatible with atrophy. Periventricular and subcortical white matter hypodensities compatible with chronic microvascular ischemic changes. Low-attenuation throughout the majority of the left cerebellar hemisphere. 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 No evidence for acute intracranial hemorrhage or significant mass effect. Vascular: Unremarkable. Skull: Intact. Sinuses/Orbits: Paranasal sinuses are well aerated. Mastoid air cells are unremarkable. Other: None. IMPRESSION: Low-attenuation within the majority of the left cerebellar hemisphere may represent subacute to chronic infarct, in the absence of priors. Consider further evaluation with MRI as clinically indicated. Atrophy and chronic microvascular ischemic changes. Electronically Signed   By: Lovey Newcomer M.D.   On: 04/08/2021 12:13    EKG: Independently reviewed. 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)   Anemia in chronic kidney disease (CKD)   Elevated brain natriuretic peptide (BNP) level   Hypoalbuminemia   Dysarthria   Coronary artery disease s/p STENT   Acute respiratory failure with hypoxia -Secondary to heart failure exacerbation volume overload -Secondary to large right pleural effusion -Treating supportively with diuresis and plan ultrasound thoracentesis  Moderate to large right pleural effusion -Patient reports shortness of breath symptoms -Requesting ultrasound paracentesis with fluid studies -Service should be available 04/09/2021 at this facility  Acute heart failure -Check 2D echocardiogram to evaluate heart function and structures -Continue IV Lasix for diuresis -Monitor intake and output Daily weights -Monitor electrolytes closely -Trend cardiac BNP -Follow portable chest x-ray  Stage IV CKD -Continue sodium bicarbonate tablets for metabolic acidosis -Consult inpatient nephrology team -Daily renal function panel testing -Try to send for old records from [place] if possible -Renally dose medications as appropriate -Check renal ultrasound -Follow-up urinalysis  Paroxysmal atrial fibrillation -Resume home metoprolol succinate for rate control -Resume apixaban  for anticoagulation -Monitor telemetry  Altered mental status - abnormal subacute findings on CT brain - MRI brain without contrast on 10/3 to rule out CVA - addressing metabolic abnormalities as noted  Anemia in chronic kidney disease -Resume home daily iron supplementation -Follow daily CBC testing -Follow-up anemia panel  Coronary artery disease status post stent placement in 2013 -Resume home cardiac medications  Hyperlipidemia -Resume atorvastatin daily  Type 2 diabetes mellitus with vascular and renal complications -Check hemoglobin A1c -Continue supplemental sliding scale coverage with frequent CBG monitoring -Carbohydrate modified diet, no  concentrated sweets or fruit juices except to treat a low blood glucose  Poorly controlled hypertension -Resume home metoprolol succinate and amlodipine, imdur, clonipine -Hydralazine IV ordered for elevated blood pressure readings SBP greater than 170  Dysarthria -Patient reports this is chronic for her but given abnormal CT scan with subacute findings will follow-up with an MRI brain without contrast which is only available earliest 04/09/2021 at this facility  Generalized weakness and gait instability -PT evaluation requested  Deconditioning and poor overall health condition -Palliative medicine consultation requested for goals of care discussions   DVT prophylaxis: apixaban   Code Status: Full   Family Communication: no family present   Disposition Plan: anticipating need for SNF placement  Consults called: palliative care, nephrology   Admission status: INP  Level of care: Telemetry Irwin Brakeman MD [facility] How to contact the [facility] Attending or Consulting provider [place] or covering provider during after hours 7P -7A, for this patient?  Check the care team in [facility] and look for a) attending/consulting [facility] provider listed and b) the [facility] team listed Log into [email or web address] and use [place]'s universal password to access. If you do not have the password, please contact the hospital operator. Locate the [facility] provider you are looking for under [facility] and page to a number that you can be directly reached. If you still have difficulty reaching the provider, please page the [facility] (Director on Call) for the Hospitalists listed on amion for assistance.   If 7PM-7AM, please contact night-coverage [email or web address] Password [email or web address]  04/08/2021, 3:07 PM

## 2021-04-08 NOTE — Progress Notes (Signed)
   04/08/21 1839  Assess: MEWS Score  BP (!) 201/74  Pulse Rate 63  Resp 18  SpO2 90 %  O2 Device Room Air  Assess: MEWS Score  MEWS Temp 0  MEWS Systolic 2  MEWS Pulse 0  MEWS RR 0  MEWS LOC 0  MEWS Score 2  MEWS Score Color Yellow  Assess: if the MEWS score is Yellow or Red  Were vital signs taken at a resting state? Yes  Focused Assessment No change from prior assessment  Early Detection of Sepsis Score *See Row Information* Low  MEWS guidelines implemented *See Row Information* Yes  Treat  MEWS Interventions Administered scheduled meds/treatments  Take Vital Signs  Increase Vital Sign Frequency  Yellow: Q 2hr X 2 then Q 4hr X 2, if remains yellow, continue Q 4hrs  Escalate  MEWS: Escalate Yellow: discuss with charge nurse/RN and consider discussing with provider and RRT  Notify: Charge Nurse/RN  Name of Charge Nurse/RN Notified Mable Fill RN  Date Charge Nurse/RN Notified 04/08/21  Time Charge Nurse/RN Notified 1851  Notify: Provider  Provider Name/Title Wynetta Emery  Date Provider Notified 04/08/21  Time Provider Notified 1851  Notification Type Page  Notification Reason Other (Comment) (yellow MEWS d/t elevated SBP)

## 2021-04-08 NOTE — ED Triage Notes (Signed)
Pt presents to ED via [facility] for edema from waist down. Pt had fall this am. Pt normally walks at home. Pt O2 sat initially 83% on RA, placed on 4L increased to 96% per [place]. Pt with new confusion x 2 days.

## 2021-04-08 NOTE — ED Provider Notes (Signed)
[facility] EMERGENCY DEPARTMENT Provider Note  CSN: [id number] Arrival date & time: 04/08/21 1002    History Chief Complaint  Patient presents with   Edema    Tiffany Valencia is a 75 y.o. female with history of HTN, DM and heart stent recently moved to [place] from her prior home in [place], we do not have any old records but she was brought in today for progressive BLE edema for the last several days. She report some SOB. EMS reported hypoxia but has been normal on RA here. She had a fall this morning and has been confused per EMS. She is difficult to understand and no family is at bedside yet. Level 5 caveat applies.    Past Medical History:  Diagnosis Date   Anemia in chronic kidney disease (CKD)    Asthma    Chronic kidney disease    Coronary artery disease    Diabetes mellitus without complication ([place])    Hypertension     Past Surgical History:  Procedure Laterality Date   ABDOMINAL HYSTERECTOMY     BACK SURGERY      No family history on file.  Social History   Tobacco Use   Smoking status: Never   Smokeless tobacco: Never  Substance Use Topics   Alcohol use: Not Currently   Drug use: Not Currently     Home Medications Prior to Admission medications   Medication Sig Start Date End Date Taking? Authorizing Provider  albuterol (VENTOLIN HFA) 108 (90 Base) MCG/ACT inhaler Inhale into the lungs. 02/03/21  Yes [provider]  amLODipine (NORVASC) 10 MG tablet Take 10 mg by mouth daily. 01/25/21  Yes [provider]  atorvastatin (LIPITOR) 80 MG tablet Take 40 mg by mouth daily. 12/26/20  Yes [provider]  ELIQUIS 2.5 MG TABS tablet Take 2.5 mg by mouth 2 (two) times daily. 03/15/21  Yes [provider]  furosemide (LASIX) 20 MG tablet Take 20 mg by mouth daily. 03/24/21  Yes [provider]  isosorbide mononitrate (IMDUR) 60 MG 24 hr tablet Take 60 mg by mouth daily. 03/15/21  Yes [provider]  LANTUS  SOLOSTAR 100 UNIT/ML Solostar Pen Inject 10 Units into the skin daily. 03/15/21  Yes [provider]  metoprolol succinate (TOPROL-XL) 100 MG 24 hr tablet Take 200 mg by mouth daily. 03/19/21  Yes [provider]  mirtazapine (REMERON) 7.5 MG tablet Take 7.5 mg by mouth at bedtime. 12/29/20  Yes [provider]  sodium bicarbonate 650 MG tablet Take 1,300 mg by mouth daily. 03/31/21  Yes [provider]  triamcinolone cream (KENALOG) 0.1 % SMARTSIG:1 Application Topical 2-3 Times Daily 03/21/21  Yes [provider]  cloNIDine (CATAPRES) 0.2 MG tablet Take 0.2 mg by mouth 3 (three) times daily. Patient not taking: No sig reported 02/14/21   [provider]  famotidine (PEPCID) 20 MG tablet Take by mouth. Patient not taking: No sig reported 02/09/21   [provider]  folic acid (FOLVITE) 1 MG tablet Take 1 mg by mouth daily. Patient not taking: No sig reported 02/08/21   [provider]  hydrALAZINE (APRESOLINE) 100 MG tablet Take 100 mg by mouth 3 (three) times daily. Patient not taking: No sig reported 02/07/21   [provider]  hydrocortisone (ANUSOL-HC) 25 MG suppository Place 25 mg rectally 2 (two) times daily. Patient not taking: No sig reported 02/07/21   [provider]  NIFEdipine (ADALAT CC) 30 MG 24 hr tablet Take  by mouth. Patient not taking: No sig reported 10/23/20   [provider]  Vitamin D, Ergocalciferol, (DRISDOL) 1.25 MG (50000 UNIT) CAPS capsule Take by mouth. Patient not taking: No sig reported 12/29/20   [provider]     Allergies    Patient has no known allergies.   Review of Systems   Review of Systems Unable to assess due to mental status.    Physical Exam BP (!) 181/79   Pulse (!) 59   Temp 97.8 F (36.6 C) (Oral)   Resp (!) 21   Ht 5' 3.5" (1.613 m)   Wt 86.2 kg   SpO2 95%   BMI 33.15 kg/m   Physical Exam Vitals and nursing note reviewed.   Constitutional:      Appearance: Normal appearance.  HENT:     Head: Normocephalic and atraumatic.     Nose: Nose normal.     Mouth/Throat:     Mouth: Mucous membranes are moist.  Eyes:     Extraocular Movements: Extraocular movements intact.     Conjunctiva/sclera: Conjunctivae normal.  Cardiovascular:     Rate and Rhythm: Normal rate.  Pulmonary:     Effort: Pulmonary effort is normal.     Breath sounds: Rhonchi and rales present.  Abdominal:     General: Abdomen is flat.     Palpations: Abdomen is soft.     Tenderness: There is no abdominal tenderness.  Musculoskeletal:        General: No swelling. Normal range of motion.     Cervical back: Neck supple.     Right lower leg: Edema (2+ to mid thigh) present.     Left lower leg: Edema (2+ to mid thigh) present.  Skin:    General: Skin is warm and dry.  Neurological:     General: No focal deficit present.     Mental Status: She is alert.     Comments: Dysarthria of unclear chronicity, difficult to understand, moves all extremities  Psychiatric:        Mood and Affect: Mood normal.     ED Results / Procedures / Treatments   Labs (all labs ordered are listed, but only abnormal results are displayed) Labs Reviewed  COMPREHENSIVE METABOLIC PANEL - Abnormal; Notable for the following components:      Result Value   CO2 18 (*)    Glucose, Bld 192 (*)    BUN 50 (*)    Creatinine, Ser 6.41 (*)    Calcium 7.6 (*)    Albumin 2.7 (*)    GFR, Estimated 6 (*)    All other components within normal limits  BRAIN NATRIURETIC PEPTIDE - Abnormal; Notable for the following components:   B Natriuretic Peptide 2,269.0 (*)    All other components within normal limits  CBC WITH DIFFERENTIAL/PLATELET - Abnormal; Notable for the following components:   RBC 3.11 (*)    Hemoglobin 8.3 (*)    HCT 28.8 (*)    MCHC 28.8 (*)    RDW 17.9 (*)    All other components within normal limits  TSH - Abnormal; Notable for the following  components:   TSH 9.838 (*)    All other components within normal limits  RETICULOCYTES - Abnormal; Notable for the following components:   RBC. 3.13 (*)    All other components within normal limits  RESP PANEL BY RT-PCR (FLU A&B, COVID) ARPGX2  URINALYSIS, ROUTINE W REFLEX MICROSCOPIC  HEMOGLOBIN A1C  VITAMIN D 25 HYDROXY (VIT  D DEFICIENCY, FRACTURES)  VITAMIN B12  FOLATE  IRON AND TIBC  FERRITIN  TROPONIN I (HIGH SENSITIVITY)    EKG EKG Interpretation  Date/Time:  Sunday April 08 2021 10:44:04 EDT Ventricular Rate:  60 PR Interval:  182 QRS Duration: 124 QT Interval:  563 QTC Calculation: 563 R Axis:   50 Text Interpretation: Sinus rhythm Nonspecific intraventricular conduction delay Borderline T abnormalities, diffuse leads No old tracing to compare Confirmed by Calvert Cantor [phone]) on 04/08/2021 10:59:18 AM  Radiology DG Chest 1 View  Result Date: 04/08/2021 CLINICAL DATA:  [place] this morning. Low O2 sats. Confusion for 2 days. EXAM: CHEST  1 VIEW COMPARISON:  None. FINDINGS: Heart is enlarged but also accentuated by technique. Moderate RIGHT pleural effusion is noted, with opacification of the RIGHT lung base which obscures the RIGHT hemidiaphragm. Pulmonary vascular congestion is present. LEFT lung is otherwise clear. No pneumothorax. No acute displaced fractures. IMPRESSION: Cardiomegaly and pulmonary vascular congestion. RIGHT pleural effusion and RIGHT LOWER lobe opacity consistent with atelectasis or infiltrate. Electronically Signed   By: Nolon Nations M.D.   On: 04/08/2021 12:30   CT Head Wo Contrast  Result Date: 04/08/2021 CLINICAL DATA:  Mental status change. EXAM: CT HEAD WITHOUT CONTRAST TECHNIQUE: Contiguous axial images were obtained from the base of the skull through the vertex without intravenous contrast. COMPARISON:  None. FINDINGS: Brain: Ventricles and sulci are prominent compatible with atrophy. Periventricular and subcortical white matter  hypodensities compatible with chronic microvascular ischemic changes. Low-attenuation throughout the majority of the left cerebellar hemisphere. 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 No evidence for acute intracranial hemorrhage or significant mass effect. Vascular: Unremarkable. Skull: Intact. Sinuses/Orbits: Paranasal sinuses are well aerated. Mastoid air cells are unremarkable. Other: None. IMPRESSION: Low-attenuation within the majority of the left cerebellar hemisphere may represent subacute to chronic infarct, in the absence of priors. Consider further evaluation with MRI as clinically indicated. Atrophy and chronic microvascular ischemic changes. Electronically Signed   By: Lovey Newcomer M.D.   On: 04/08/2021 12:13    Procedures Procedures  Medications Ordered in the ED Medications  insulin aspart (novoLOG) injection 0-6 Units (has no administration in time range)  furosemide (LASIX) injection 40 mg (has no administration in time range)  acetaminophen (TYLENOL) tablet 650 mg (has no administration in time range)    Or  acetaminophen (TYLENOL) suppository 650 mg (has no administration in time range)  oxyCODONE (Oxy IR/ROXICODONE) immediate release tablet 2.5 mg (has no administration in time range)  traZODone (DESYREL) tablet 25 mg (has no administration in time range)  bisacodyl (DULCOLAX) EC tablet 5 mg (has no administration in time range)  ondansetron (ZOFRAN) tablet 4 mg (has no administration in time range)    Or  ondansetron (ZOFRAN) injection 4 mg (has no administration in time range)  pantoprazole (PROTONIX) EC tablet 40 mg (has no administration in time range)  albuterol (PROVENTIL) (2.5 MG/3ML) 0.083% nebulizer solution 2.5 mg (has no administration in time range)  hydrALAZINE (APRESOLINE) injection 10 mg (has no administration in time range)  furosemide (LASIX) injection 40 mg (40 mg Intravenous Given 04/08/21 1235)     MDM  Rules/Calculators/A&P MDM Patient with ?AMS and fall as well as LE edema of unclear etiology. She is apparently taking diuretics but unsure what other medical history she has. Will check labs, CXR, CT head and wait for family to arrive, no contact information available on arrival.   ED Course  I have reviewed the triage vital signs and  the nursing notes.  Pertinent labs & imaging results that were available during my care of the patient were reviewed by me and considered in my medical decision making (see chart for details).  Clinical Course as of 04/08/21 1540  Sun Apr 08, 2021  1154 CBC with moderate anemia, no old to compare.  [CS]  1209 BNP is markedly elevated, unknown baseline. Will give a dose of IV lasix.  [CS]  [id number] with elevated Cr. Patient unable to tell me her baseline. She states she had kidney problems one time and they gave her 'a sulfa drug' and it got better. She is unable to give me the number for any family members and none have arrived yet. Will ask RN to call [facility] to see if they have contact information.  [CS]  [id number] CT with subacute/chronic infarct but no old to compare.  [CS]  [id number] CXR with vascular congestion [CS]  1239 Covid is neg [CS]  1326 Spoke with Scheryl Marten, who is the patient's god-daughter and recently moved the patient down to her house from [place] where she was living by herself. Patient was unable to continue caring for herself and so she was moved here about 3-4 weeks ago. At her baseline, she is not very mobile but was able to get to the bathroom on her own. She was able to walk with a walker until the last few days but now is unable to walk because her feet were swollen. [name] is going to come to the hospital to be with the patient.  [CS]  1352 Spoke with Dr. Murvin Natal, Hospitalist, who will come evaluate the patient for admission.  [CS]    Clinical Course User Index [CS] Truddie Hidden, MD    Final Clinical Impression(s) / ED  Diagnoses Final diagnoses:  Acute on chronic systolic CHF (congestive heart failure) ([place])  AKI (acute kidney injury) ([place])  Chronic kidney disease, unspecified CKD stage    Rx / DC Orders ED Discharge Orders     None        Truddie Hidden, MD 04/08/21 1540

## 2021-04-09 ENCOUNTER — Inpatient Hospital Stay (HOSPITAL_COMMUNITY): Payer: Medicare Other

## 2021-04-09 ENCOUNTER — Encounter (HOSPITAL_COMMUNITY): Payer: Self-pay | Admitting: Family Medicine

## 2021-04-09 DIAGNOSIS — I63421 Cerebral infarction due to embolism of right anterior cerebral artery: Secondary | ICD-10-CM

## 2021-04-09 DIAGNOSIS — N179 Acute kidney failure, unspecified: Secondary | ICD-10-CM | POA: Diagnosis not present

## 2021-04-09 DIAGNOSIS — J9601 Acute respiratory failure with hypoxia: Secondary | ICD-10-CM | POA: Diagnosis not present

## 2021-04-09 DIAGNOSIS — R4182 Altered mental status, unspecified: Secondary | ICD-10-CM | POA: Diagnosis not present

## 2021-04-09 DIAGNOSIS — I5031 Acute diastolic (congestive) heart failure: Secondary | ICD-10-CM

## 2021-04-09 DIAGNOSIS — D6869 Other thrombophilia: Secondary | ICD-10-CM | POA: Diagnosis not present

## 2021-04-09 LAB — GLUCOSE, CAPILLARY
Glucose-Capillary: 124 mg/dL — ABNORMAL HIGH (ref 70–99)
Glucose-Capillary: 75 mg/dL (ref 70–99)
Glucose-Capillary: 83 mg/dL (ref 70–99)
Glucose-Capillary: 96 mg/dL (ref 70–99)
Glucose-Capillary: 105 mg/dL — ABNORMAL HIGH (ref 70–99)

## 2021-04-09 LAB — GRAM STAIN: Gram Stain: NONE SEEN

## 2021-04-09 LAB — CBC
HCT: 26.2 % — ABNORMAL LOW (ref 36.0–46.0)
Hemoglobin: 7.6 g/dL — ABNORMAL LOW (ref 12.0–15.0)
MCH: 26.8 pg (ref 26.0–34.0)
MCHC: 29 g/dL — ABNORMAL LOW (ref 30.0–36.0)
MCV: 92.3 fL (ref 80.0–100.0)
Platelets: 123 10*3/uL — ABNORMAL LOW (ref 150–400)
RBC: 2.84 MIL/uL — ABNORMAL LOW (ref 3.87–5.11)
RDW: 18 % — ABNORMAL HIGH (ref 11.5–15.5)
WBC: 4.2 10*3/uL (ref 4.0–10.5)
nRBC: 0 % (ref 0.0–0.2)

## 2021-04-09 LAB — ECHOCARDIOGRAM COMPLETE
AR max vel: 1.83 cm2
AV Area VTI: 1.69 cm2
AV Area mean vel: 1.8 cm2
AV Mean grad: 5 mmHg
AV Peak grad: 9.1 mmHg
Ao pk vel: 1.51 m/s
Area-P 1/2: 2.68 cm2
Height: 63.5 in
MV VTI: 1.79 cm2
S' Lateral: 2.9 cm
Weight: 3082.91 oz

## 2021-04-09 LAB — RENAL FUNCTION PANEL
Albumin: 2.3 g/dL — ABNORMAL LOW (ref 3.5–5.0)
Anion gap: 5 (ref 5–15)
BUN: 51 mg/dL — ABNORMAL HIGH (ref 8–23)
CO2: 21 mmol/L — ABNORMAL LOW (ref 22–32)
Calcium: 7.8 mg/dL — ABNORMAL LOW (ref 8.9–10.3)
Chloride: 113 mmol/L — ABNORMAL HIGH (ref 98–111)
Creatinine, Ser: 6.72 mg/dL — ABNORMAL HIGH (ref 0.44–1.00)
GFR, Estimated: 6 mL/min — ABNORMAL LOW (ref >60)
Glucose, Bld: 104 mg/dL — ABNORMAL HIGH (ref 70–99)
Phosphorus: 5.4 mg/dL — ABNORMAL HIGH (ref 2.5–4.6)
Potassium: 4 mmol/L (ref 3.5–5.1)
Sodium: 139 mmol/L (ref 135–145)

## 2021-04-09 LAB — BODY FLUID CELL COUNT WITH DIFFERENTIAL
Lymphs, Fluid: 83 %
Monocyte-Macrophage-Serous Fluid: 15 % — ABNORMAL LOW (ref 50–90)
Neutrophil Count, Fluid: 2 % (ref 0–25)
Total Nucleated Cell Count, Fluid: 230 cu mm (ref 0–1000)

## 2021-04-09 LAB — LIPID PANEL
Cholesterol: 99 mg/dL (ref 0–200)
HDL: 39 mg/dL — ABNORMAL LOW (ref >40)
LDL Cholesterol: 49 mg/dL (ref 0–99)
Total CHOL/HDL Ratio: 2.5 RATIO
Triglycerides: 55 mg/dL (ref <150)
VLDL: 11 mg/dL (ref 0–40)

## 2021-04-09 LAB — GLUCOSE, PLEURAL OR PERITONEAL FLUID: Glucose, Fluid: 111 mg/dL

## 2021-04-09 LAB — HEMOGLOBIN A1C
Hgb A1c MFr Bld: 5.9 % — ABNORMAL HIGH (ref 4.8–5.6)
Mean Plasma Glucose: 123 mg/dL

## 2021-04-09 LAB — PROTEIN, PLEURAL OR PERITONEAL FLUID: Total protein, fluid: <3 g/dL

## 2021-04-09 LAB — BRAIN NATRIURETIC PEPTIDE: B Natriuretic Peptide: 2094 pg/mL — ABNORMAL HIGH (ref 0.0–100.0)

## 2021-04-09 LAB — MAGNESIUM: Magnesium: 1.8 mg/dL (ref 1.7–2.4)

## 2021-04-09 IMAGING — DX DG CHEST 1V
1 series · 1 of 1 positions shown · non-contrast
Comparison: Exam at [AH] hours compared to [AH] hours

CLINICAL DATA: RIGHT pleural effusion post thoracentesis

EXAM:
CHEST  1 VIEW

[chest pa]
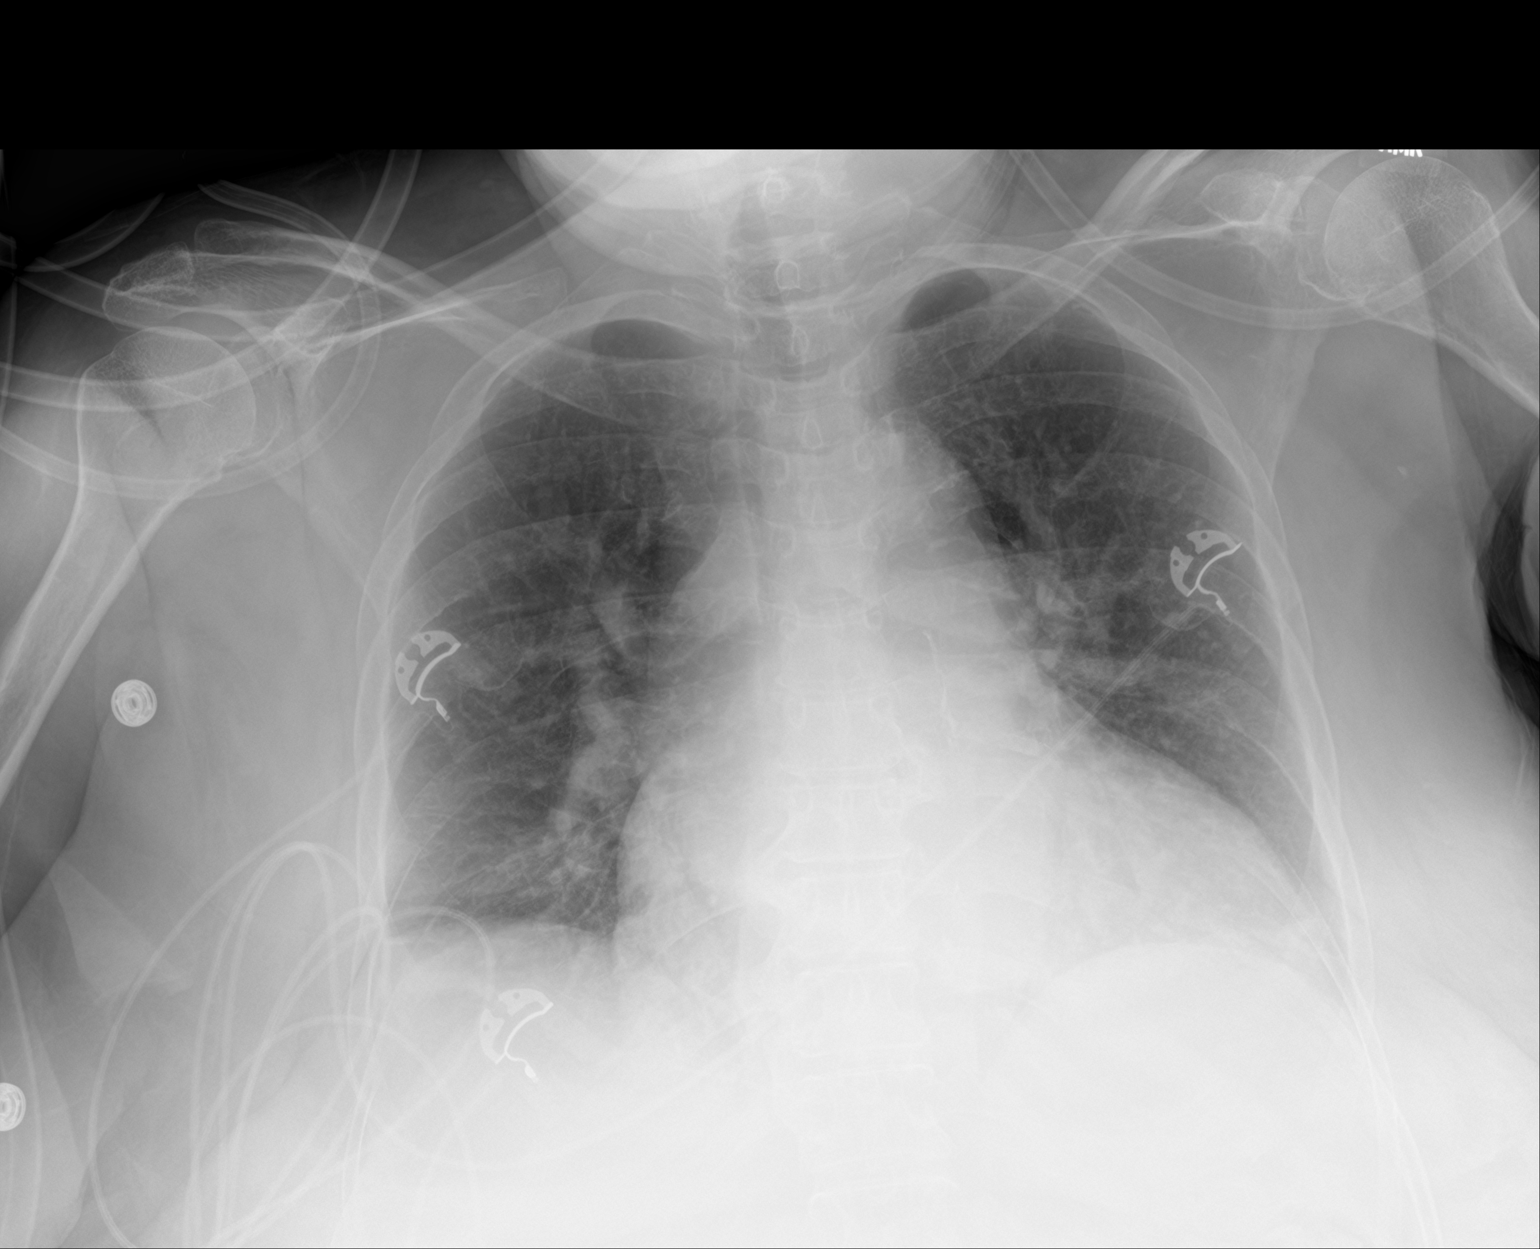

[1 of 1 positions shown; findings below may reference images not displayed]

FINDINGS: Resolution of RIGHT pleural effusion.

Minimal residual bibasilar atelectasis.

No pneumothorax.

Enlargement of cardiac silhouette persists.

Atherosclerotic calcification aorta.
IMPRESSION: No pneumothorax following RIGHT thoracentesis.

Minimal bibasilar atelectasis.

## 2021-04-09 IMAGING — US US RENAL
1 series · 14 of 25 positions shown · non-contrast
Comparison: None

CLINICAL DATA: Pulmonary edema, pleural effusion, diabetes
mellitus, hypertension, altered mental

EXAM:
RENAL / URINARY TRACT ULTRASOUND COMPLETE

[Series 1: us renal · 14 of 36 slices shown]
[im 1/36]
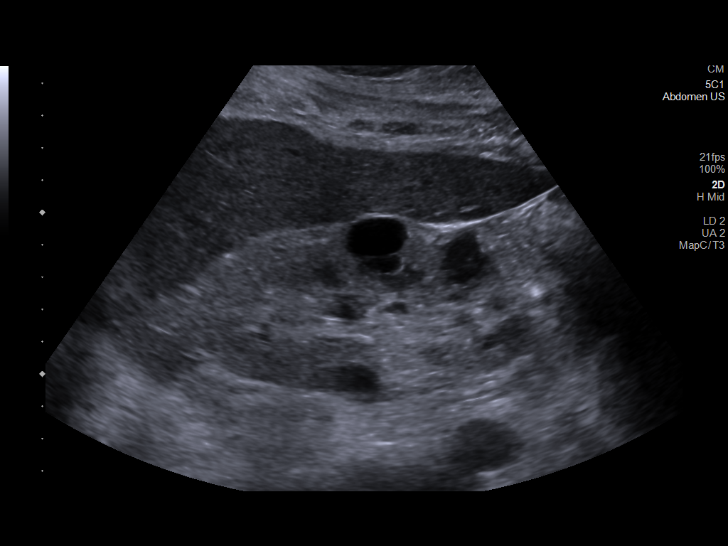
[im 3/36]
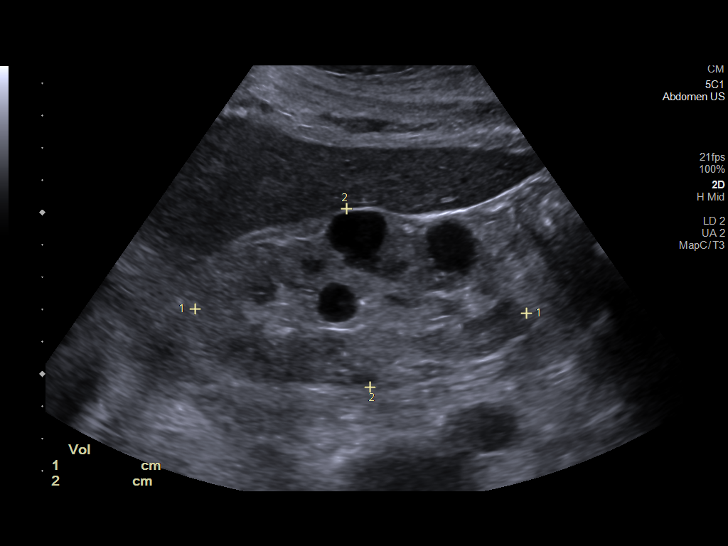
[im 6/36]
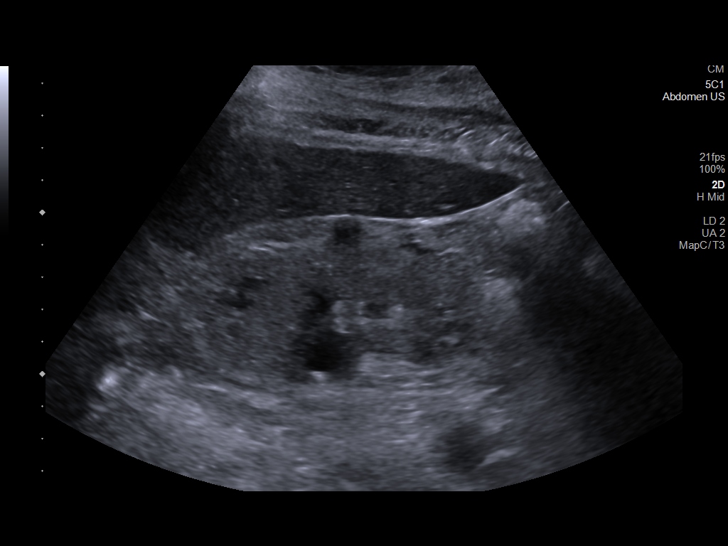
[im 9/36]
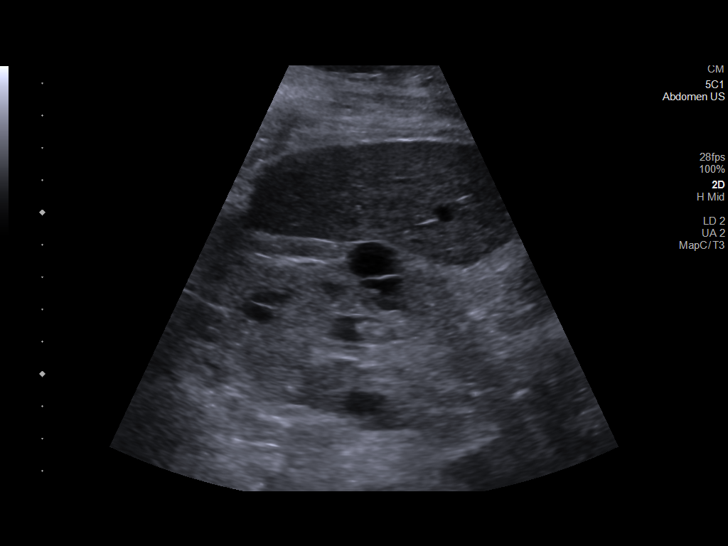
[im 12/36]
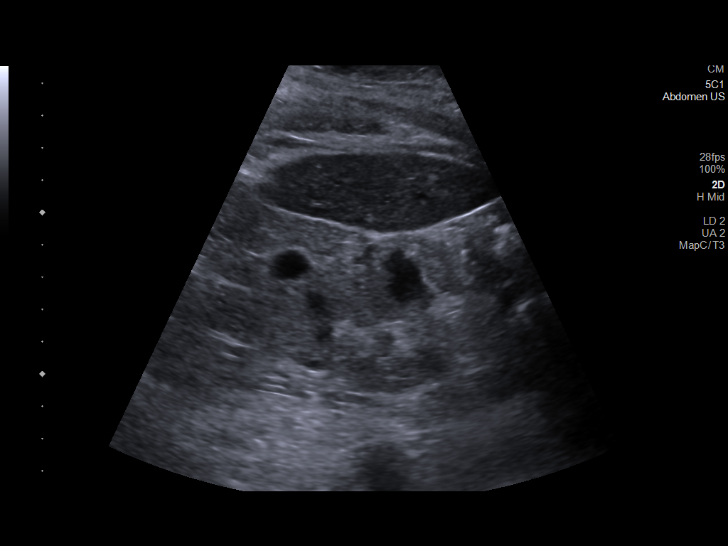
[im 14/36]
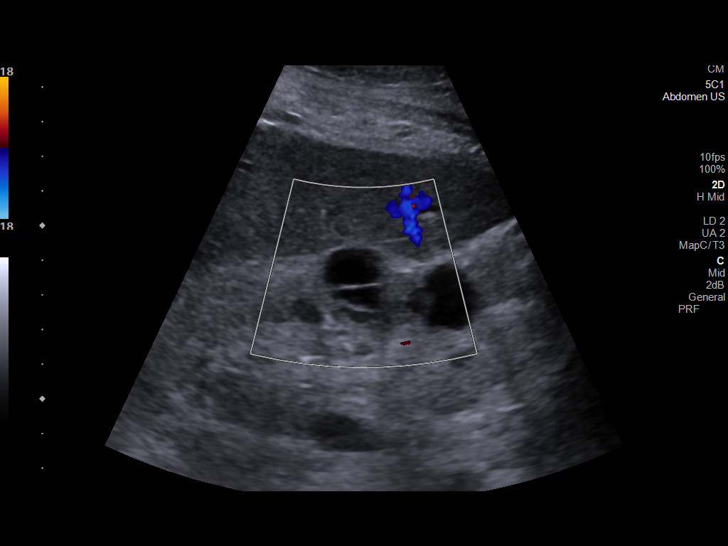
[im 17/36]
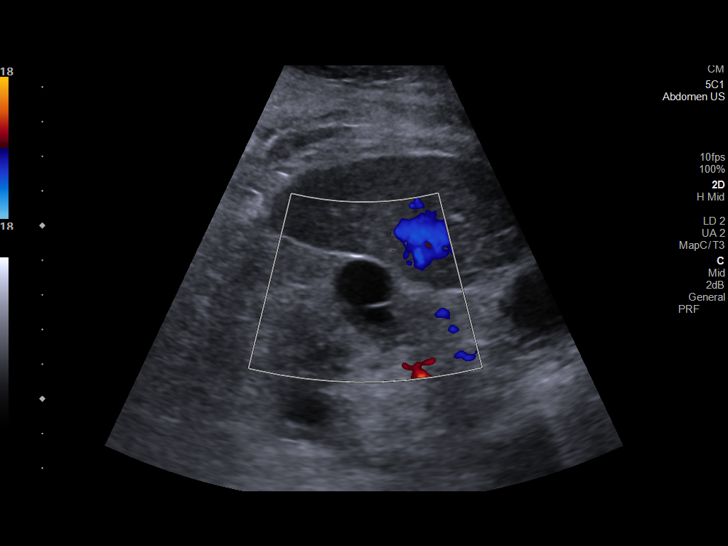
[im 19/36]
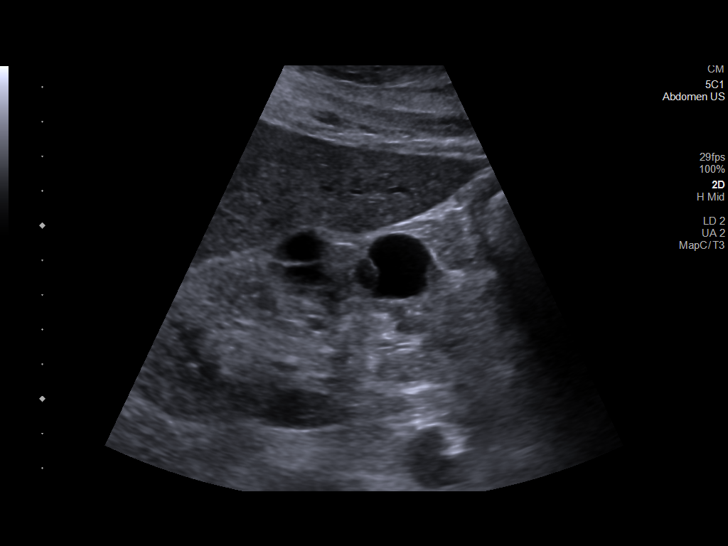
[im 22/36]
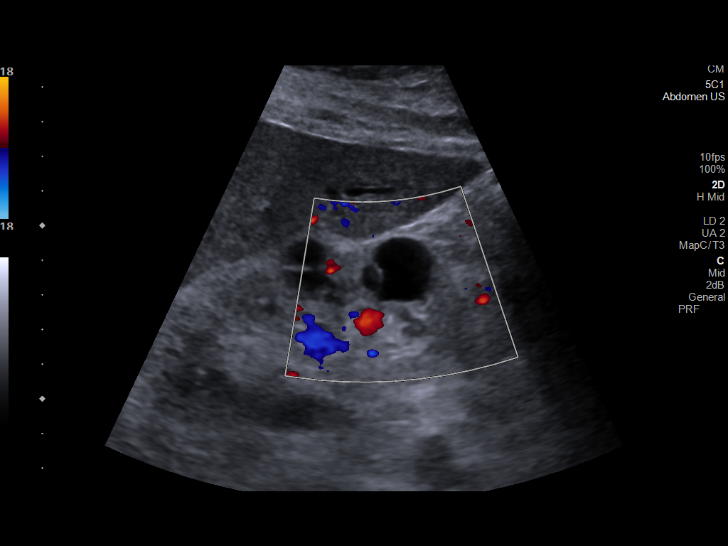
[im 24/36]
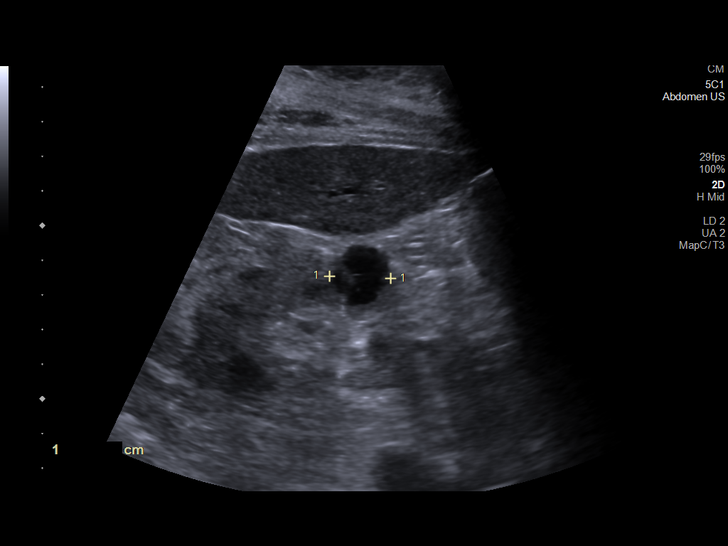
[im 27/36]
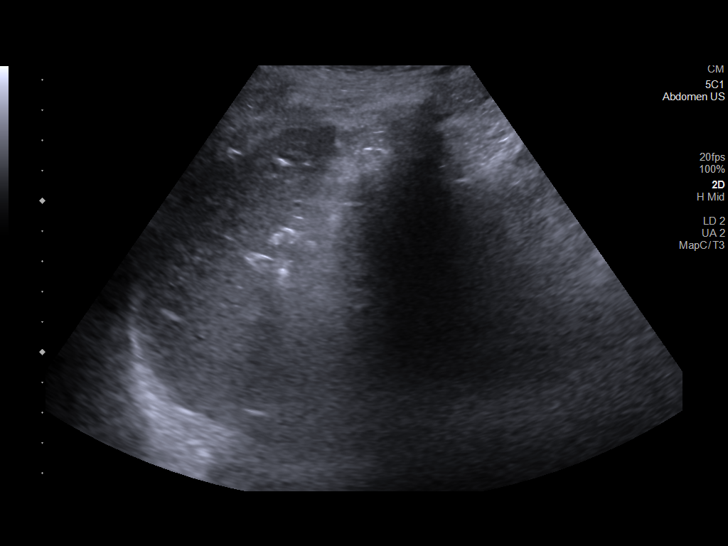
[im 30/36]
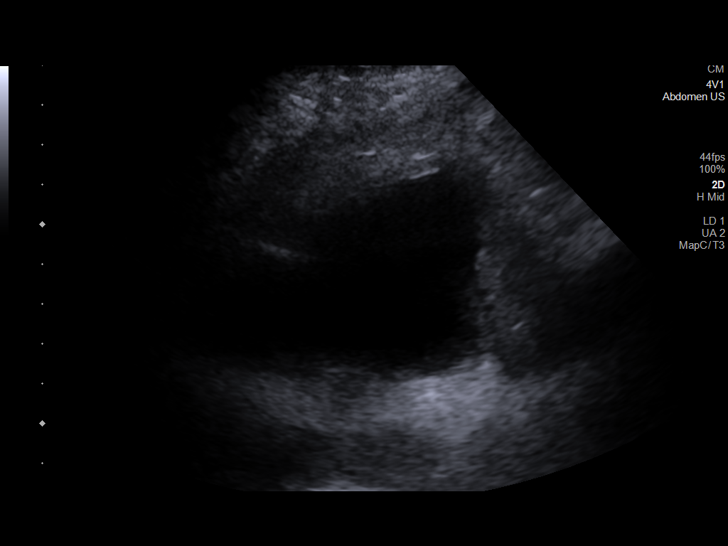
[im 33/36]
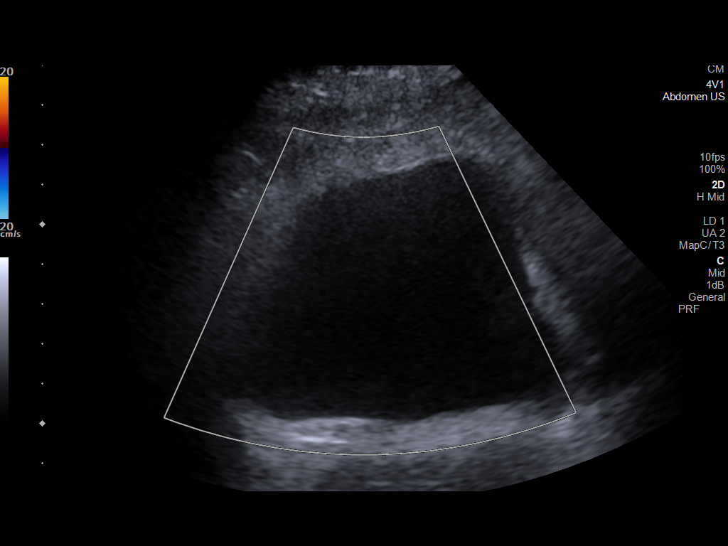
[im 36/36]
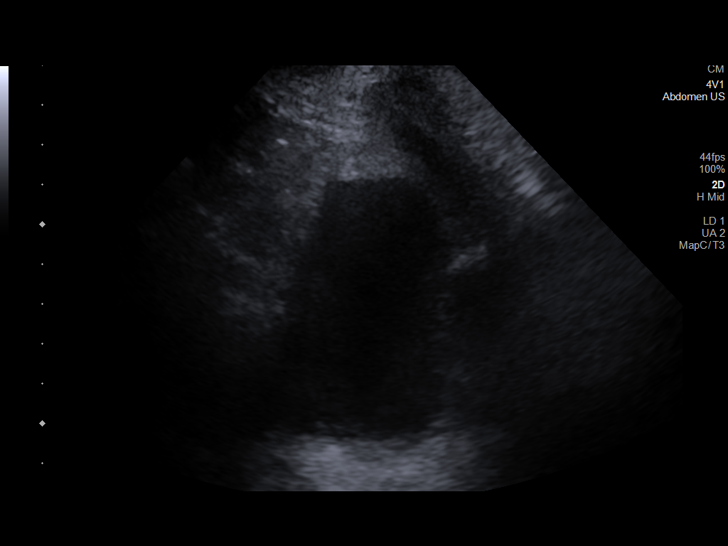

[14 of 25 positions shown; findings below may reference images not displayed]

FINDINGS: Right Kidney:

Renal measurements: 10.3 x 5.6 x 5.5 cm = volume: 164 mL. Normal
cortical thickness. Significantly increased cortical echogenicity.
No hydronephrosis or shadowing calcification. Multiple RIGHT renal
cysts, largest 2.2 cm and 2.3 cm in greatest sizes.

Left Kidney:

No LEFT kidney visualized, question obscured, ectopic, or surgically
versus developmentally absent.

Bladder:

Appears normal for degree of bladder distention.

Other:

N/A
IMPRESSION: Nonvisualization of LEFT kidney.

Medical renal disease changes RIGHT kidney with multiple renal
cysts.

## 2021-04-09 IMAGING — MR MR HEAD W/O CM
14 of 16 series · 33 of 48 positions shown · non-contrast
Comparison: Same-day noncontrast CT head

CLINICAL DATA: Altered mental status, does not communicate



[Series 5: DWI · axial · 4.0mm · 0.88mm/px · z∈[-43,+96]mm · 3 of 36 slices shown (1 of 6)]
[im 1/36]
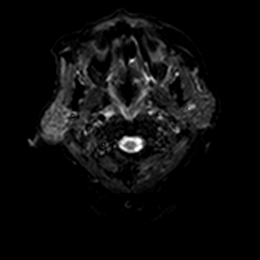
[im 18/36]
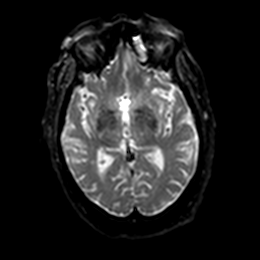
[im 36/36]
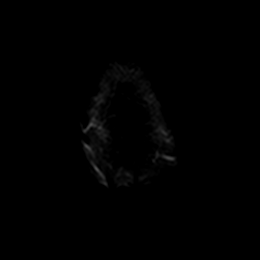

[Series 5: DWI · axial · 4.0mm · 0.88mm/px · z∈[-43,+96]mm · 3 of 36 slices shown (2 of 6)]
[im 1/36]
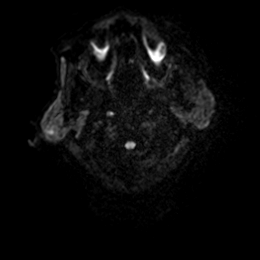
[im 18/36]
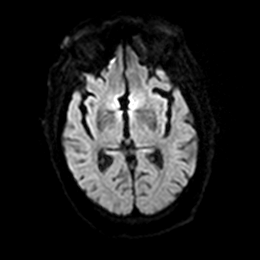
[im 36/36]
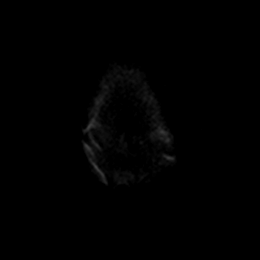

[Series 6: DWI · axial · 4.0mm · 0.88mm/px · z∈[-43,+96]mm · 2 of 36 slices shown (3 of 6)]
[im 1/36]
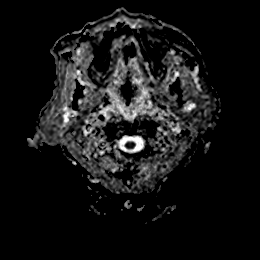
[im 36/36]
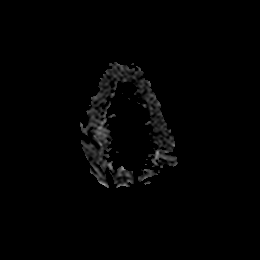

[Series 7: DWI · coronal · 5.0mm · 0.88mm/px · 1 of 28 slices shown (4 of 6)]
[im 1/28]
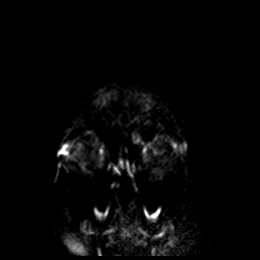

[Series 7: DWI · coronal · 5.0mm · 0.88mm/px · 1 of 28 slices shown (5 of 6)]
[im 1/28]
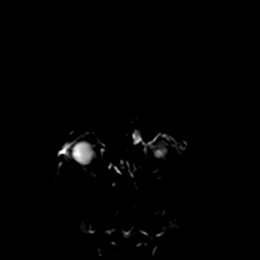

[Series 8: DWI · coronal · 5.0mm · 0.88mm/px · 1 of 28 slices shown (6 of 6)]
[im 1/28]
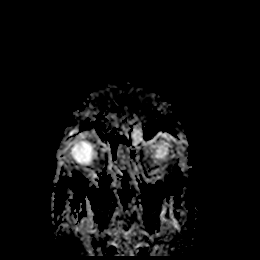

[Series 9: T1 · sagittal · 5.0mm · 0.94mm/px · 1 of 25 slices shown]
[im 1/25]
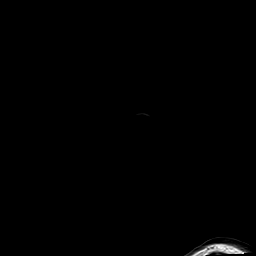

[Series 15: T2 · axial · 5.0mm · 0.72mm/px · 1 of 20 slices shown (1 of 2)]
[im 1/20]
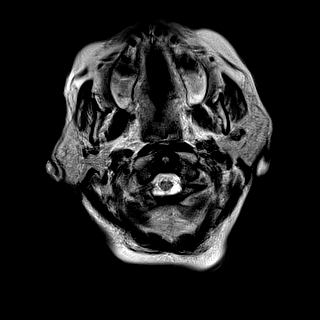

[Series 16: ax hemo · axial · 5.0mm · 0.86mm/px · 1 of 25 slices shown]
[im 1/25]
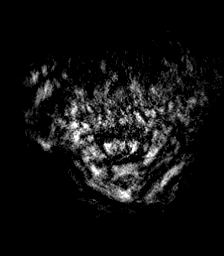

[Series 17: FLAIR · axial · 4.0mm · 0.43mm/px · z∈[-36,+87]mm · 2 of 32 slices shown]
[im 1/32]
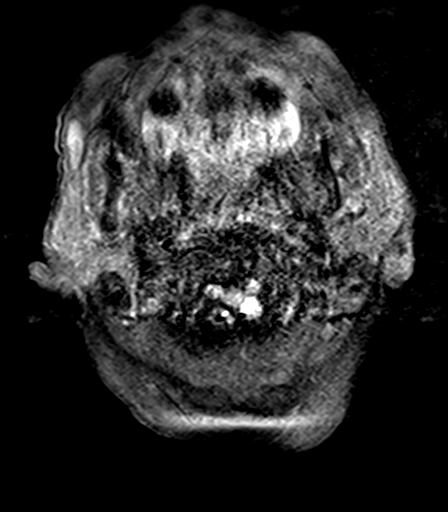
[im 32/32]
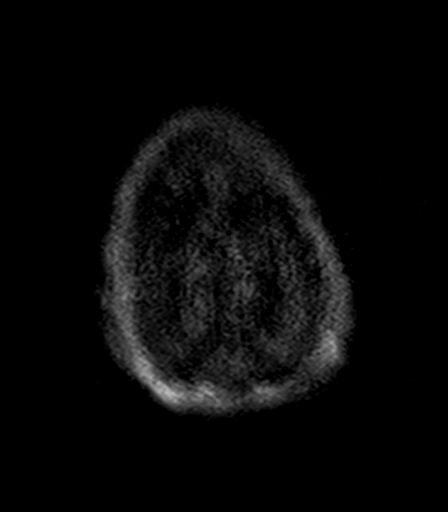

[Series 19: T2 · coronal · 5.0mm · 0.72mm/px · 1 of 28 slices shown (2 of 2)]
[im 1/28]
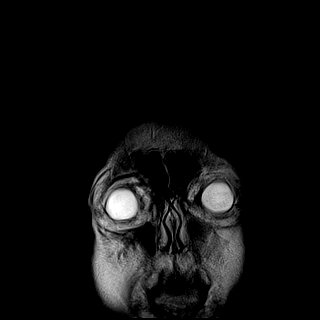

[Series 26: tof_fl3d_tra_iso · axial · 0.6mm · 0.52mm/px · z∈[-148,-72]mm · 7 of 133 slices shown]
[im 1/133]
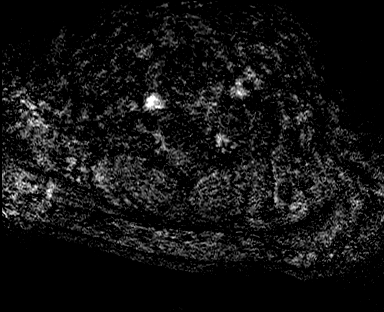
[im 23/133]
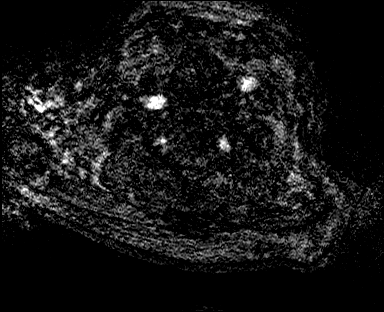
[im 45/133]
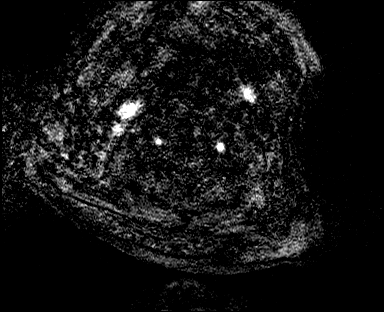
[im 67/133]
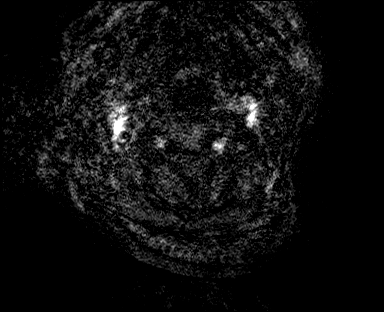
[im 89/133]
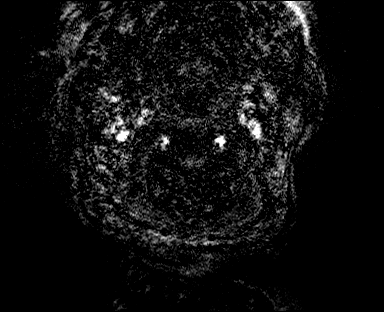
[im 111/133]
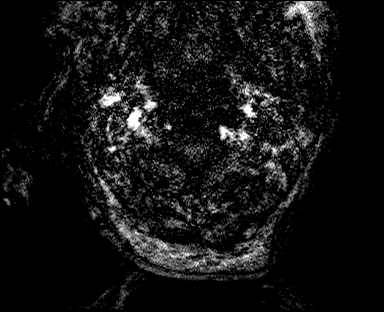
[im 133/133]
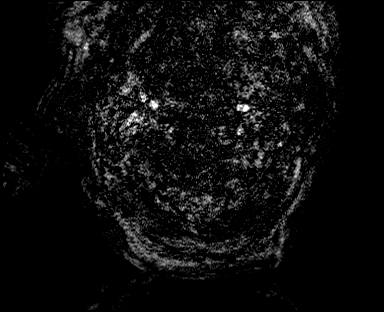

[Series 29: angio_fl3d_cor_pre_ttc=3.0s · coronal · 0.9mm · 0.85mm/px · 4 of 80 slices shown (1 of 2)]
[im 1/80]
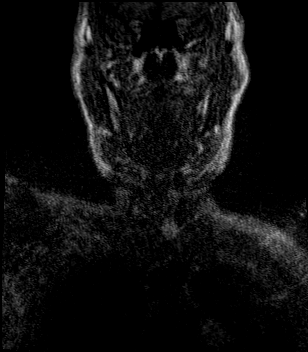
[im 27/80]
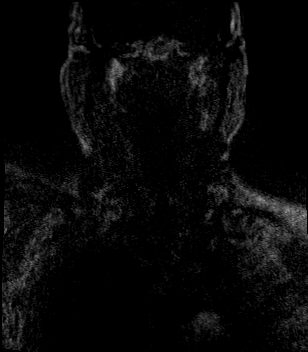
[im 53/80]
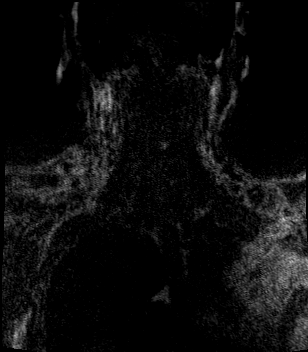
[im 80/80]
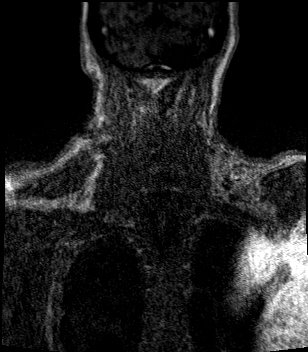

[Series 30: angio_fl3d_cor_pre_ttc=3.0s · coronal · 0.9mm · 0.85mm/px · 5 of 96 slices shown (2 of 2)]
[im 1/96]
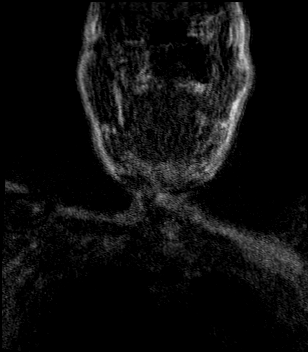
[im 24/96]
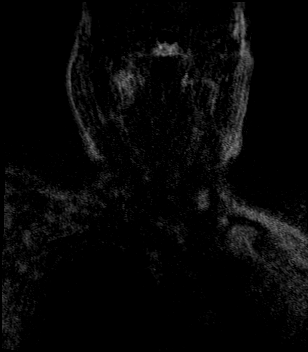
[im 48/96]
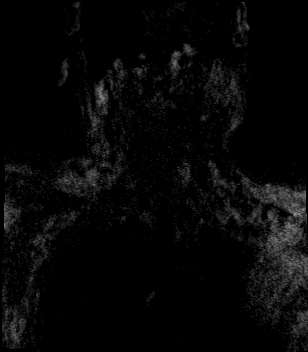
[im 72/96]
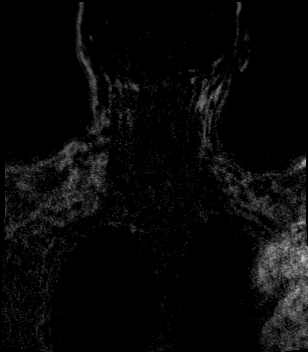
[im 96/96]
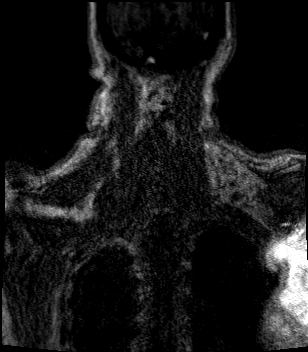

[33 of 48 positions shown; findings below may reference images not displayed]

FINDINGS: Image quality is significantly degraded by motion artifact.

MRI HEAD FINDINGS

Brain: There is diffusion restriction in the right ACA distribution
involving the right aspect of the genu of the corpus callosum and
right parasagittal frontal lobe. The FLAIR sequences markedly motion
degraded, but there appears to be associated FLAIR signal
abnormality. Findings consistent with evolving early subacute
infarct. Susceptibility artifact along the falx on the T2* sequence
is likely due to bulky dural calcifications seen on the same-day
head CT. There is no convincing evidence of hemorrhagic
transformation.

There is a large remote infarct in the left cerebellar hemisphere
and a small remote infarct in the right cerebellar hemisphere.

There is a background of mild-to-moderate global parenchymal volume
loss with enlargement of the ventricular system and extra-axial CSF
spaces. Additional foci of T2/FLAIR signal abnormality in the
subcortical and periventricular white matter likely reflects sequela
of chronic white matter microangiopathy.

Vascular: The major intracranial flow voids are present.

Skull and upper cervical spine: Normal marrow signal.

Sinuses/Orbits: The imaged paranasal sinuses are clear. Bilateral
lens implants are in place. The globes and orbits are otherwise
unremarkable.

Other: There is a left mastoid effusion.

MRA HEAD FINDINGS

Evaluation of the intracranial vasculature is markedly degraded by
motion artifact.

Anterior circulation: The right intracranial ICA appears patent.
Evaluation of the left intracranial ICA is markedly degraded, but
appears to be patent. The bilateral M1 segments are patent. The
distal branches appear overall patent, but are not well evaluated.

The proximal anterior cerebral arteries are patent. The distal
anterior cerebral arteries are not well evaluated.

Anterior circulation aneurysm can not be excluded.

Posterior circulation: The V4 segments of the vertebral arteries are
patent. The basilar artery is patent. The proximal PCAs are patent.
The distal branches are not well evaluated.

Posterior circulation aneurysms can not be excluded.

Anatomic variants: The posterior communicating arteries are not
definitively identified.

MRA NECK FINDINGS

Evaluation of the vasculature of the neck is markedly degraded by
motion artifact.

Aortic arch: Not evaluated.

Right carotid System: Time-of-flight MRA demonstrates antegrade flow
in the right carotid system. Stenosis or dissection can not be
evaluated.

Left carotid System: Time-of-flight MRA demonstrates antegrade flow
in the left carotid system. Stenosis or dissection can not be
evaluated.

Vertebral arteries: Time-of-flight MRA demonstrates antegrade flow
in the vertebral arteries. Stenosis or dissection can not be
evaluated.
IMPRESSION: 1. Markedly motion degraded study with essentially nondiagnostic MRA
of the head and neck.
2. Early subacute infarct in the right ACA distribution without
definite evidence of hemorrhagic transformation.
3. The intracranial vasculature is markedly suboptimally evaluated.
The proximal vessels appear grossly patent. The distal vasculature
is not evaluated.
4. Time-of-flight MRA demonstrates antegrade flow in the carotid
systems and vertebral arteries. Stenoses or dissection can not be
evaluated.
5. Remote infarcts in the bilateral cerebellar hemispheres, left
worse than right.
6. Consider CTA of the head/neck for evaluation of the vasculature
as indicated given reduced susceptibility to motion artifact.

## 2021-04-09 IMAGING — DX DG CHEST 1V PORT
1 series · 1 of 1 positions shown · non-contrast
Comparison: Chest x-ray [DATE].

CLINICAL DATA: 75-year-old female with history of right pleural
effusion and pulmonary edema.

EXAM:
PORTABLE CHEST 1 VIEW

[chest ap]
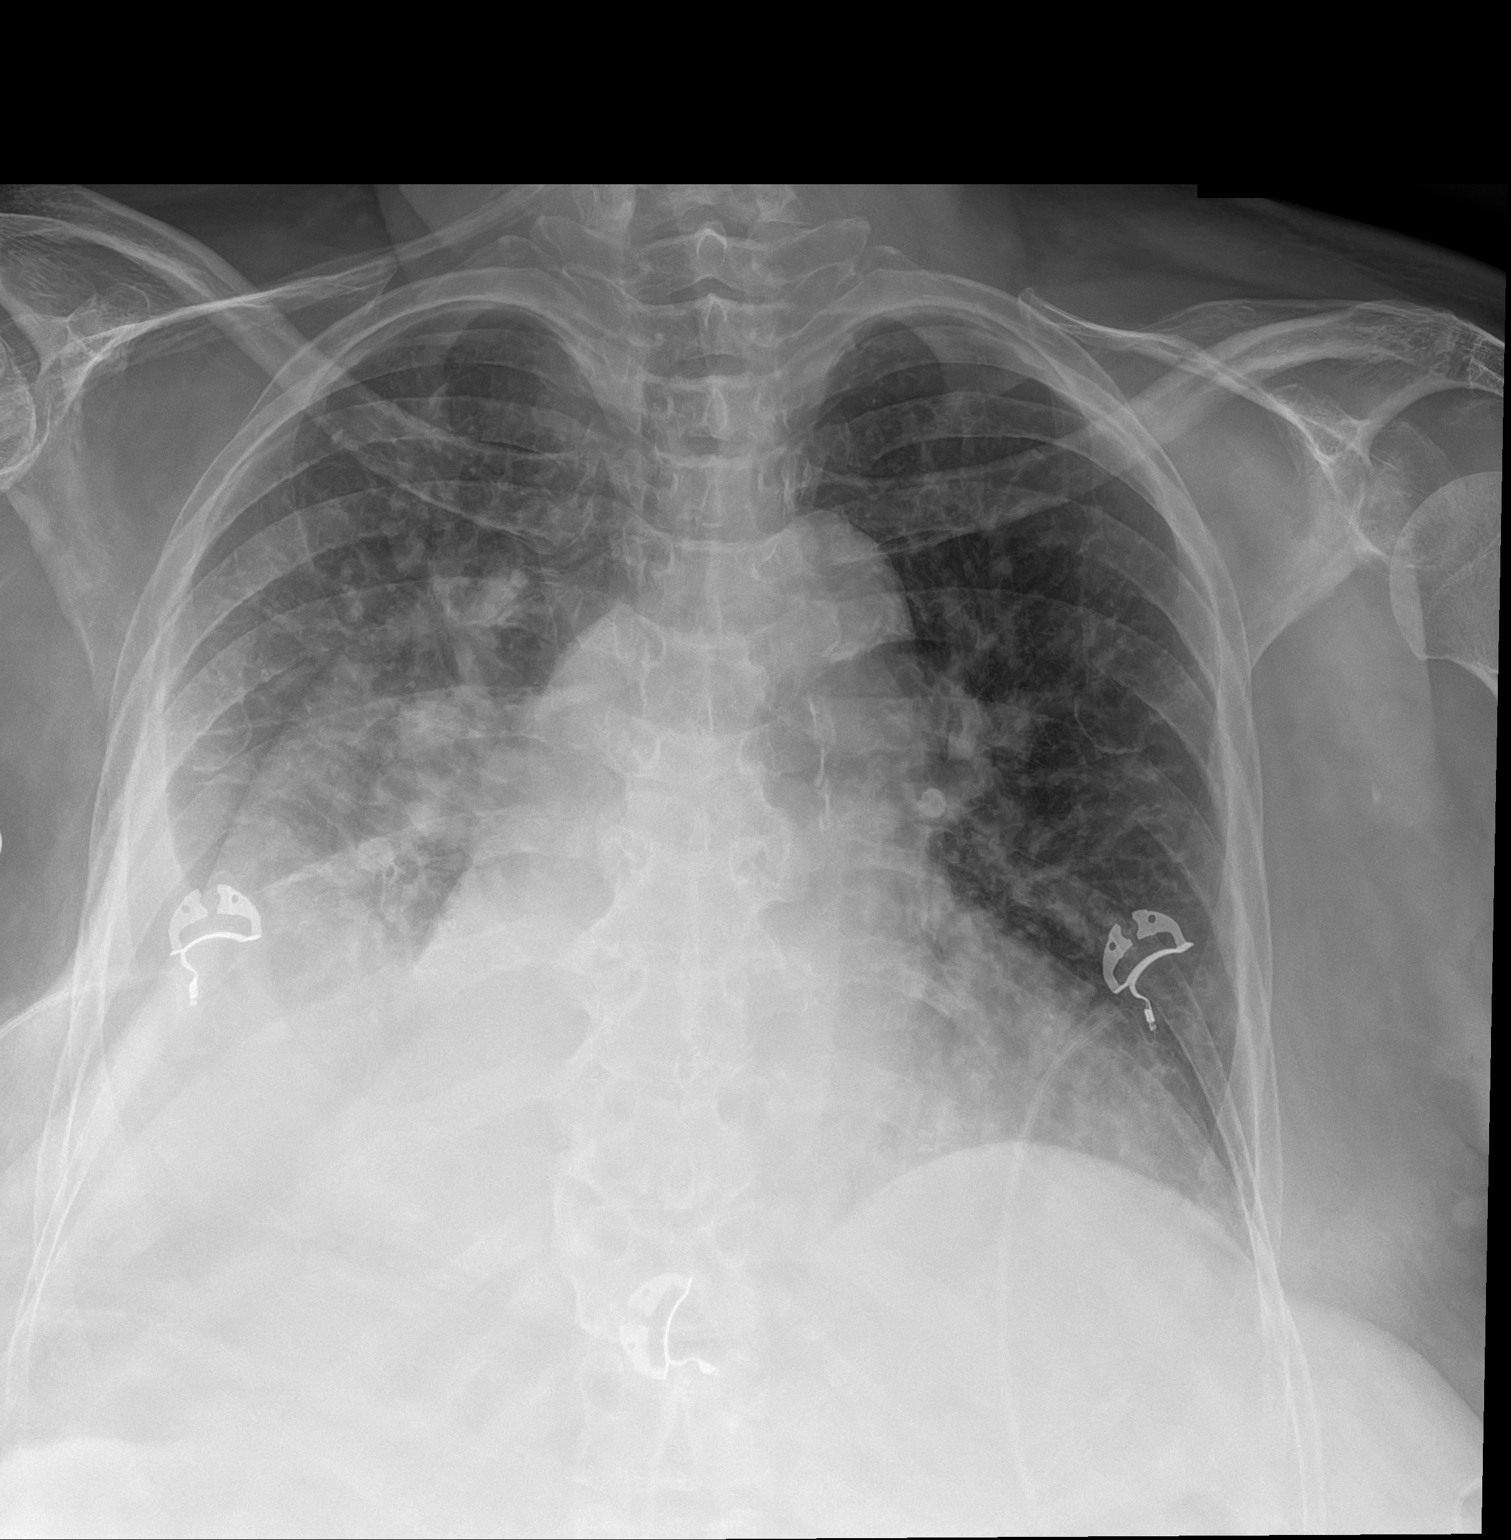

[1 of 1 positions shown; findings below may reference images not displayed]

FINDINGS: Opacity in the base of the right hemithorax compatible with
atelectasis and/or consolidation, with superimposed moderate to
large right pleural effusion. Left lung appears clear. No left
pleural effusion. Cephalization of the pulmonary vasculature. Heart
size is mildly enlarged. Atherosclerotic calcifications in the
thoracic aorta.
IMPRESSION: 1. Persistent moderate to large right pleural effusion with
atelectasis and/or consolidation in the right lung base.
2. Mild cardiomegaly.
3. Aortic atherosclerosis.

1.

## 2021-04-09 IMAGING — CT CT HEAD CODE STROKE
4 of 5 series · 16 of 47 positions shown, 18 images · non-contrast
Comparison: Head CT from yesterday

CLINICAL DATA: Code stroke.  Altered mental status

EXAM:
CT HEAD WITHOUT CONTRAST
TECHNIQUE: Contiguous axial images were obtained from the base of the skull
through the vertex without intravenous contrast.

[Series 3: head w o · axial · 0.47mm/px · z∈[-146,-12]mm · 8 of 35 slices shown, 10 images]
[im 4/35  brain]
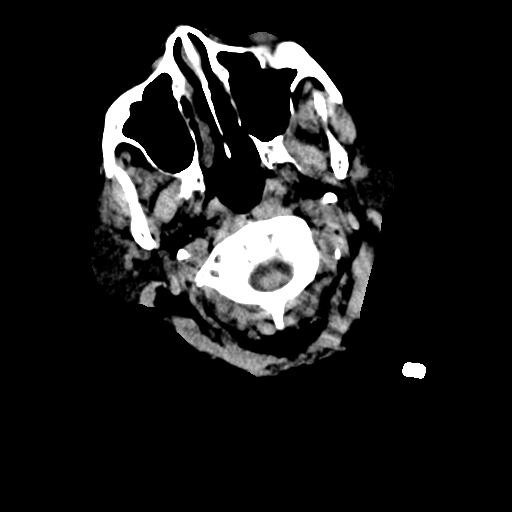
[im 4/35  bone]
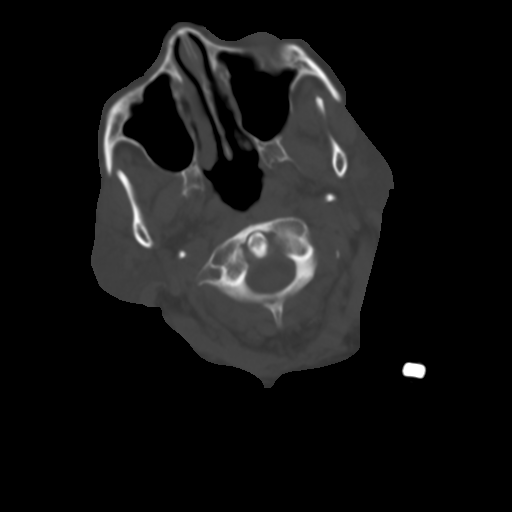
[im 8/35  brain]
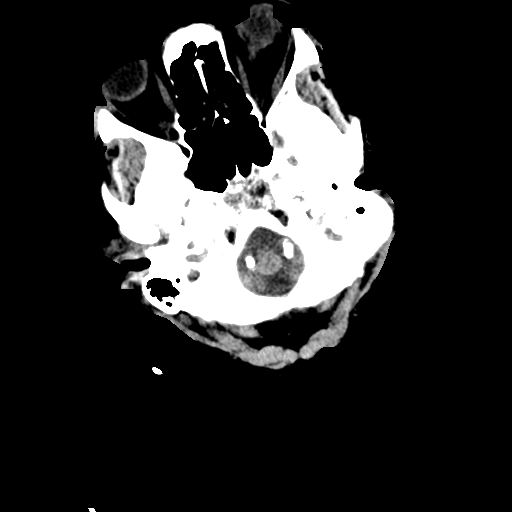
[im 12/35  brain]
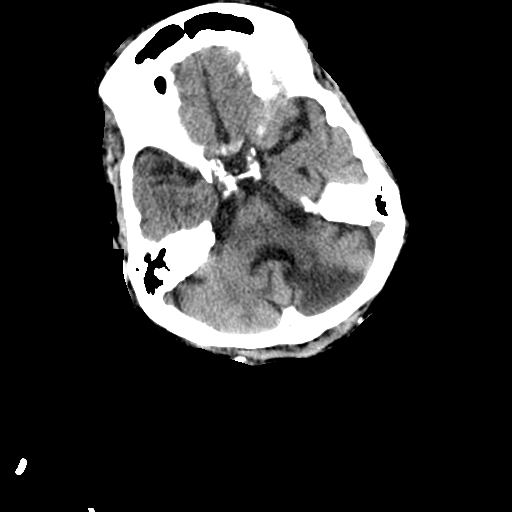
[im 16/35  brain]
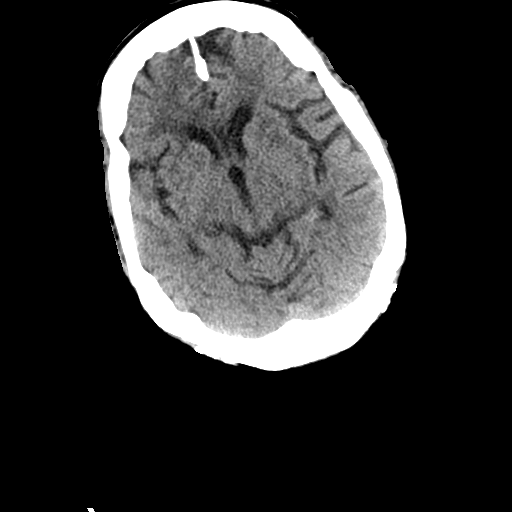
[im 19/35  brain]
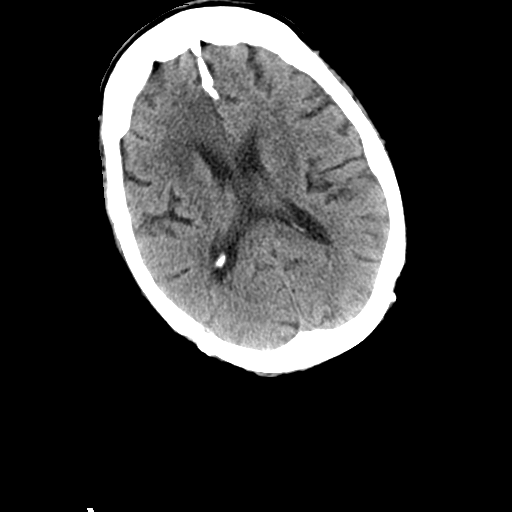
[im 19/35  bone]
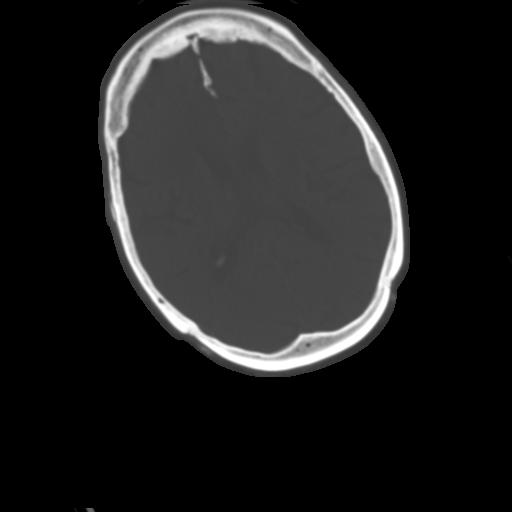
[im 23/35  brain]
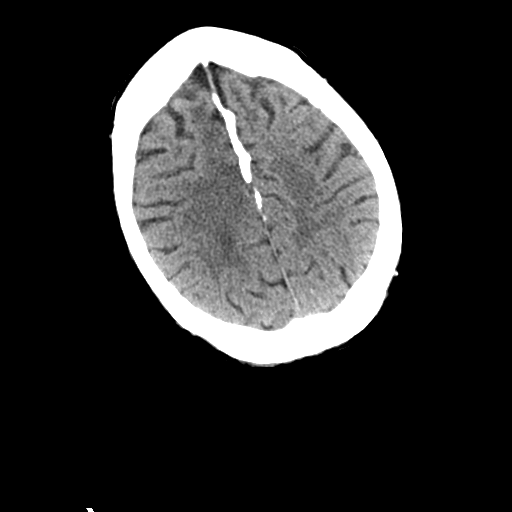
[im 27/35  brain]
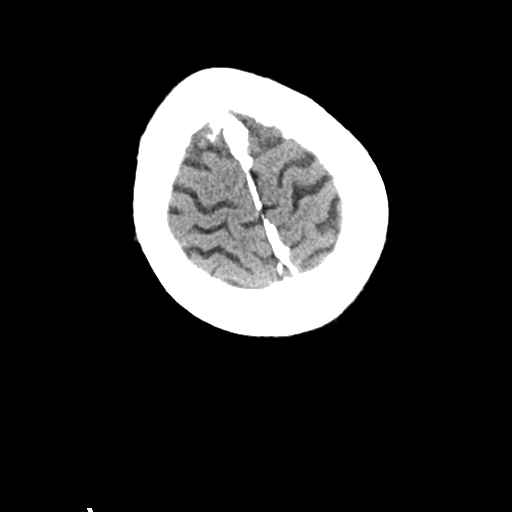
[im 31/35  brain]
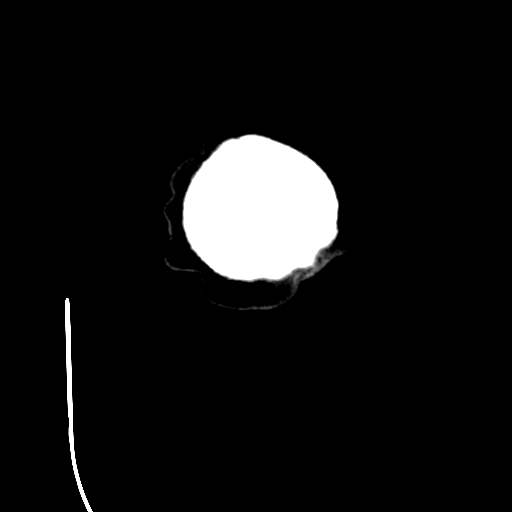

[Series 6: coronal soft · coronal · 0.36mm/px · 3 of 68 slices shown]
[im 23/68  brain]
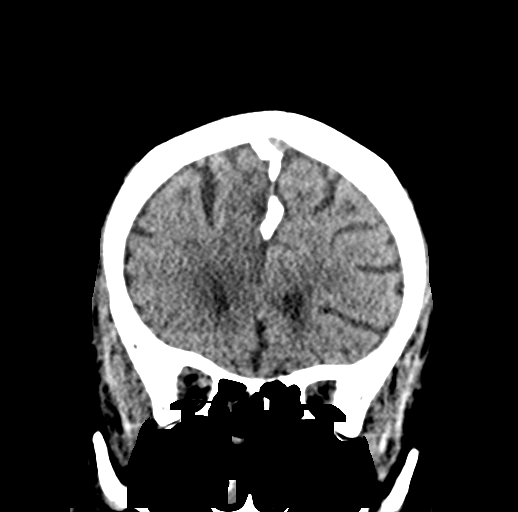
[im 30/68  brain]
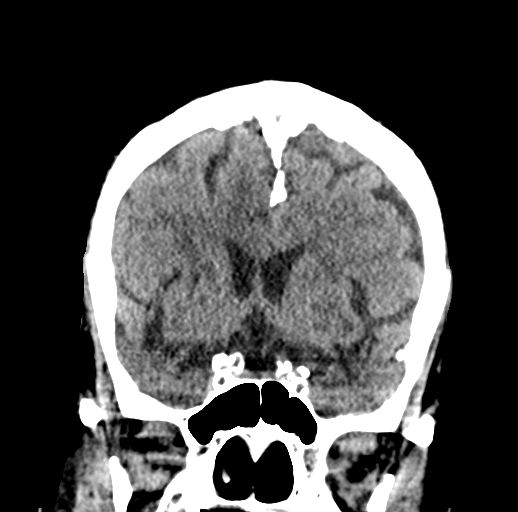
[im 38/68  brain]
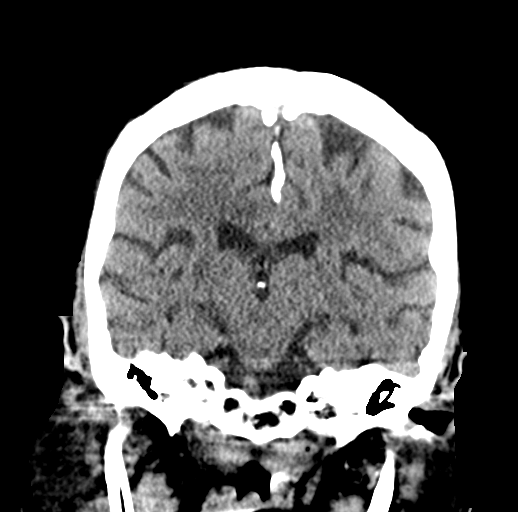

[Series 7: sagittal soft · sagittal · 0.35mm/px · 3 of 51 slices shown]
[im 17/51  brain]
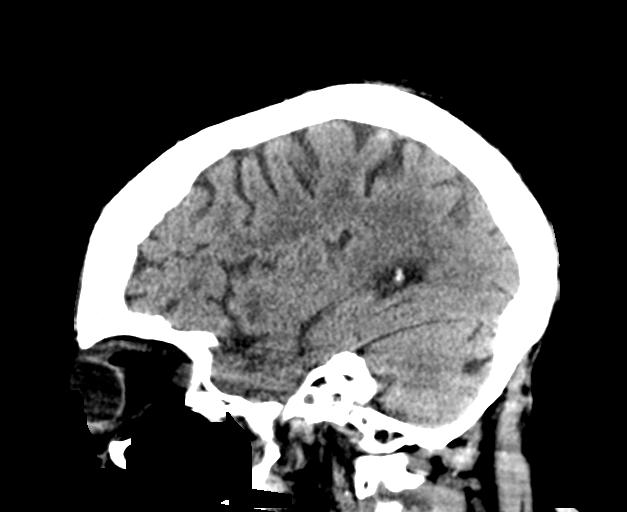
[im 26/51  brain]
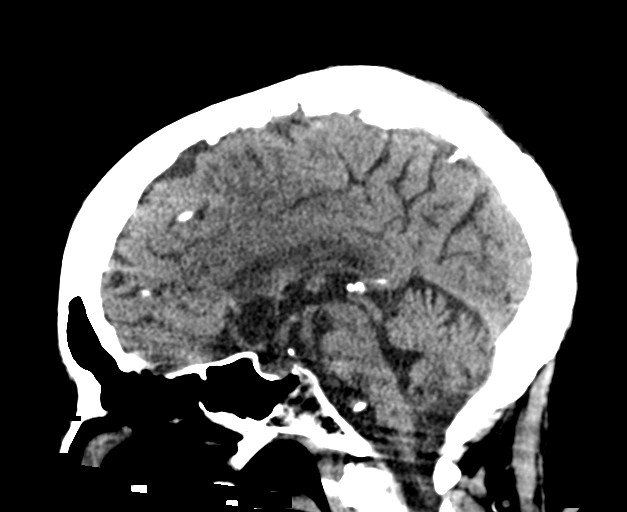
[im 34/51  brain]
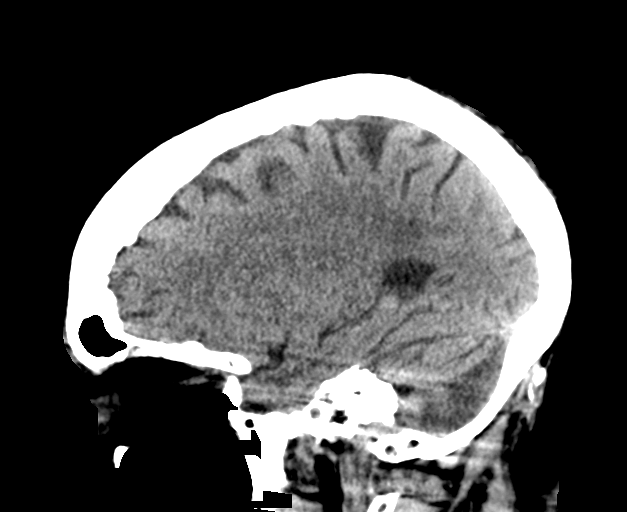

[Series 8: head ax w o · axial · 0.35mm/px · z∈[-146,-126]mm · 2 of 37 slices shown]
[im 5/37  brain]
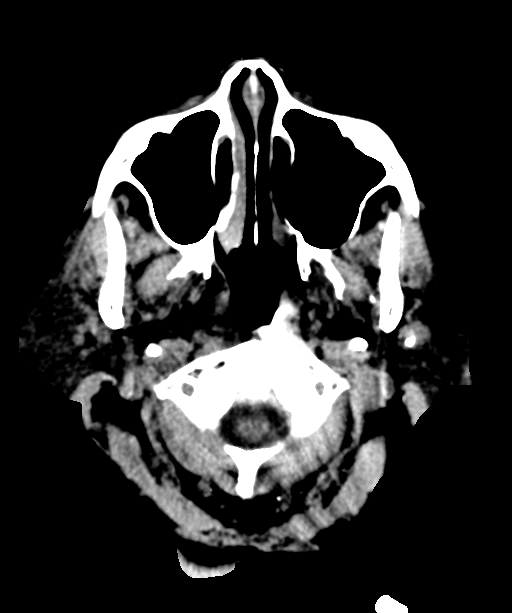
[im 9/37  brain]
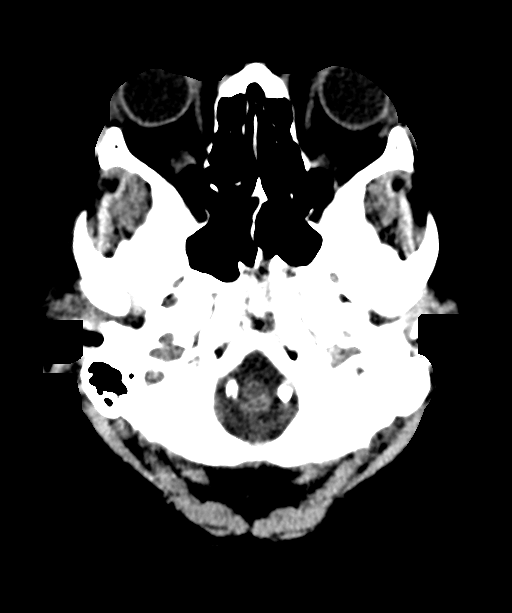

[16 of 47 positions shown; findings below may reference images not displayed]

FINDINGS: Brain: New cytotoxic edema in the parasagittal right frontal lobe,
ACA distribution. Chronic small vessel ischemia in the hemispheric
white matter with chronic basal ganglia lacunar infarcts. Chronic
left larger than right cerebellar infarcts. Encephalomalacia in the
left frontal lobe at the anterior cranial fossa and anterior
parasagittal region, likely also post ischemic.

Vascular: No hyperdense vessel or unexpected calcification.

Skull: Normal. Negative for fracture or focal lesion.

Sinuses/Orbits: Bilateral cataract resection

Other: These results were communicated to Dr. JUMPER at [DATE] on
[DATE] by text page via the [employer] messaging system.
IMPRESSION: 1. Acute cortical infarct is newly seen in the parasagittal right
frontal lobe, ACA branch distribution.
2. Chronic ischemic injury as described, including remote left ACA
territory infarcts.

## 2021-04-09 IMAGING — US US THORACENTESIS ASP PLEURAL SPACE W/IMG GUIDE
1 series · 1 of 1 positions shown · non-contrast
Comparison: none

INDICATION: RIGHT pleural effusion

[Series 1: us thoracentesis asp pleural space w/img guide · 1 of 1 slices shown]
[im 1/1]
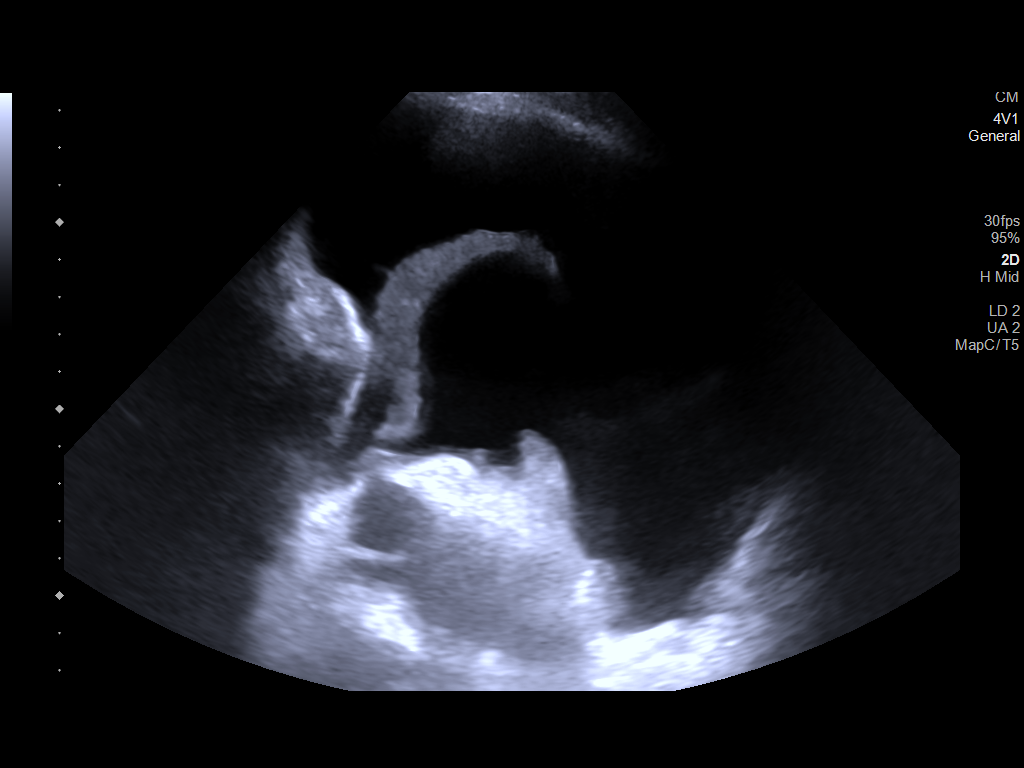

[1 of 1 positions shown; findings below may reference images not displayed]

EXAM:
ULTRASOUND GUIDED DIAGNOSTIC AND THERAPEUTIC RIGHT THORACENTESIS

MEDICATIONS:
None.

COMPLICATIONS:
None immediate.

PROCEDURE:
An ultrasound guided thoracentesis was thoroughly discussed with the
patient and questions answered. The benefits, risks, alternatives
and complications were also discussed. The patient understands and
wishes to proceed with the procedure. Written consent was obtained.

Ultrasound was performed to localize and mark an adequate pocket of
fluid in the RIGHT chest. The area was then prepped and draped in
the normal sterile fashion. 1% Lidocaine was used for local
anesthesia. Under ultrasound guidance a 5 French Yueh catheter was
introduced. Thoracentesis was performed. The catheter was removed
and a dressing applied.
FINDINGS: A total of approximately 1 L of yellow RIGHT pleural fluid was
removed. Samples were sent to the laboratory as requested by the
clinical team.
IMPRESSION: Successful ultrasound guided RIGHT thoracentesis yielding 1 L of
pleural fluid.

## 2021-04-09 MED ORDER — ISOSORBIDE MONONITRATE ER 60 MG PO TB24
30.0000 mg | ORAL_TABLET | Freq: Every day | ORAL | Status: DC
  Administered 2021-04-11 – 2021-04-13 (×3): 30 mg via ORAL
  Filled 2021-04-09 (×4): qty 1

## 2021-04-09 MED ORDER — AMLODIPINE BESYLATE 5 MG PO TABS
5.0000 mg | ORAL_TABLET | Freq: Every day | ORAL | Status: DC
  Administered 2021-04-11: 5 mg via ORAL
  Filled 2021-04-09 (×2): qty 1

## 2021-04-09 MED ORDER — HEPARIN SODIUM (PORCINE) 5000 UNIT/ML IJ SOLN
5000.0000 [IU] | Freq: Three times a day (TID) | INTRAMUSCULAR | Status: AC
  Administered 2021-04-09 – 2021-04-11 (×7): 5000 [IU] via SUBCUTANEOUS
  Filled 2021-04-09 (×7): qty 1

## 2021-04-09 MED ORDER — METOPROLOL SUCCINATE ER 50 MG PO TB24
50.0000 mg | ORAL_TABLET | Freq: Every day | ORAL | Status: DC
  Administered 2021-04-11 – 2021-04-13 (×3): 50 mg via ORAL
  Filled 2021-04-09 (×4): qty 1

## 2021-04-09 MED ORDER — DEXTROSE 50 % IV SOLN
INTRAVENOUS | Status: AC
  Filled 2021-04-09: qty 50

## 2021-04-09 MED ORDER — SODIUM CHLORIDE 0.9 % IV SOLN
250.0000 mg | Freq: Every day | INTRAVENOUS | Status: AC
  Administered 2021-04-09 – 2021-04-12 (×4): 250 mg via INTRAVENOUS
  Filled 2021-04-09 (×4): qty 20

## 2021-04-09 MED ORDER — ENOXAPARIN SODIUM 40 MG/0.4ML IJ SOSY: 40.0000 mg | PREFILLED_SYRINGE | INTRAMUSCULAR | Status: DC

## 2021-04-09 MED ORDER — INSULIN GLARGINE-YFGN 100 UNIT/ML ~~LOC~~ SOLN
4.0000 [IU] | Freq: Every day | SUBCUTANEOUS | Status: DC
  Administered 2021-04-09: 4 [IU] via SUBCUTANEOUS
  Filled 2021-04-09: qty 0.04

## 2021-04-09 MED ORDER — DEXTROSE 50 % IV SOLN
1.0000 | INTRAVENOUS | Status: AC
  Administered 2021-04-09: 50 mL via INTRAVENOUS
  Filled 2021-04-09: qty 50

## 2021-04-09 MED ORDER — CLONIDINE HCL 0.1 MG PO TABS
0.1000 mg | ORAL_TABLET | Freq: Two times a day (BID) | ORAL | Status: DC
  Administered 2021-04-09 – 2021-04-11 (×2): 0.1 mg via ORAL
  Filled 2021-04-09 (×3): qty 1

## 2021-04-09 MED ORDER — ASPIRIN EC 325 MG PO TBEC
325.0000 mg | DELAYED_RELEASE_TABLET | Freq: Every day | ORAL | Status: DC
  Administered 2021-04-09 – 2021-04-11 (×3): 325 mg via ORAL
  Filled 2021-04-09 (×3): qty 1

## 2021-04-09 MED ORDER — ASPIRIN 300 MG RE SUPP
300.0000 mg | Freq: Every day | RECTAL | Status: DC
  Filled 2021-04-09: qty 1

## 2021-04-09 MED ORDER — DARBEPOETIN ALFA 200 MCG/0.4ML IJ SOSY
200.0000 ug | PREFILLED_SYRINGE | Freq: Once | INTRAMUSCULAR | Status: AC
  Administered 2021-04-09: 200 ug via SUBCUTANEOUS
  Filled 2021-04-09: qty 0.4

## 2021-04-09 NOTE — Consult Note (Signed)
[facility] Telemedicine Consult   Requesting Provider: Dr. Wynetta Emery  Chief Complaint: confusion, left sided weakness  HPI: history obtained from pt caregiver [name], grandson Elta Guadeloupe and chart review.  75 year old female with history of CAD status post stent in 2013, hyperlipidemia, hypertension, diabetes on insulin, CKD with metabolic acidosis, PAF on Eliquis 2.5 twice daily and metoprolol, anemia on iron pills, 3 strokes with dysarthria admitted for AMS.   According to caregiver and grandson, early in 03/2021, caregiver was in [place] visiting her family, patient was in [place] at the time caught caregiver for help.  Caregiver went to her house found her on the floor with feces and urine.  Patient told the caregiver that she has been on the floor for 2 days.  Caregiver then brought patient back to [place] and now living with her grandson.  Per caregiver and grandson, patient was not confused, needs some help for ADL but mostly doing bit activity by herself, walk with walker, sitting in the front porch, going to doctor's appointment independently.  Still has slurred speech from previous stroke but no arm leg significant weakness.  However, for the last 3 days, patient caregiver went out of town, grandson found patient sitting in her room all day long, not getting out from chair, not talking much, not walking.  However, still moving all extremities per grandson.  When caregiver came back yesterday, patient was found to have bilateral lower extremity swelling and altered mental status.  She was sent to ER for evaluation.  In ER, found to have hypertensive urgency with BP as high as 210/84, creatinine significant elevated at 6.41 and today 6.72, still has urine.  Nephrology consulted, no indication for acute dialysis.  Hemoglobin 8.3, today at 7.6.  CT head no acute finding, chronic right caudate head, right CR, bilateral L>R cerebellar infarcts, and left ACA small encephalomalacia.    Last check 9:46 PM with AAO x2.  However this morning, she was sleeping and then later she was found to have left arm and leg weakness.  Code stroke activated.  BP 167/65, heart rate 54, glucose 75.  Stat CT new acute cortical infarct in the right frontal lobe ACA distribution.  Patient took Eliquis last night 9:46 PM.  LKW: 9:46 PM last night when taking medication tpa given?: No, on Eliquis, outside window IR Thrombectomy? No, established stroke Modified Rankin Scale: 3-Moderate disability-requires help but walks WITHOUT assistance   Exam: Vitals:   04/09/21 0526 04/09/21 0915  BP: (!) 167/86 (!) 167/65  Pulse: 82 (!) 54  Resp: 20 18  Temp: 98.8 F (37.1 C)   SpO2: 99% 99%     Temp:  [97.8 F (36.6 C)-98.8 F (37.1 C)] 98.8 F (37.1 C) (10/03 0526) Pulse Rate:  [54-82] 54 (10/03 0915) Resp:  [15-22] 18 (10/03 0915) BP: (167-210)/(65-97) 167/65 (10/03 0915) SpO2:  [83 %-99 %] 99 % (10/03 0915) Weight:  [86.2 kg-87.4 kg] 87.4 kg (10/03 0526)  General - Well nourished, well developed, in no apparent distress.  Ophthalmologic - fundi not visualized due to noncooperation.  Cardiovascular - irregularly irregular heart rate and rhythm.  Neuro - awake, alert, eyes open, orientated to age, place, time. No aphasia, paucity of speech, following all simple commands. Able to name and repeat. No gaze palsy, tracking bilaterally, blinking visual threat bilaterally. No significant facial droop. Tongue midline.  Right upper extremity 4/5, right lower extremity 3/5, however left upper extremity drift to bed within 10 seconds, left lower extremity slight movement spontaneously.  Sensation symmetrical bilaterally subjectively, right FTN intact grossly, gait not tested.  Marland Kitchen   NIH Stroke Scale  Level Of Consciousness 0=Alert; keenly responsive 1=Arouse to minor stimulation 2=Requires repeated stimulation to arouse or movements to pain 3=postures or unresponsive 0  LOC Questions to Month  and Age 56=Answers both questions correctly 1=Answers one question correctly or dysarthria/intubated/trauma/language barrier 2=Answers neither question correctly or aphasia 0  LOC Commands      -Open/Close eyes     -Open/close grip     -Pantomime commands if communication barrier 0=Performs both tasks correctly 1=Performs one task correctly 2=Performs neighter task correctly 0  Best Gaze     -Only assess horizontal gaze 0=Normal 1=Partial gaze palsy 2=Forced deviation, or total gaze paresis 0  Visual 0=No visual loss 1=Partial hemianopia 2=Complete hemianopia 3=Bilateral hemianopia (blind including cortical blindness) 0  Facial Palsy     -Use grimace if obtunded 0=Normal symmetrical movement 1=Minor paralysis (asymmetry) 2=Partial paralysis (lower face) 3=Complete paralysis (upper and lower face) 0  Motor  0=No drift for 10/5 seconds 1=Drift, but does not hit bed 2=Some antigravity effort, hits  bed 3=No effort against gravity, limb falls 4=No movement 0=Amputation/joint fusion Right Arm 0     Leg 0    Left Arm 2     Leg 3  Limb Ataxia     - FNT/HTS 0=Absent or does not understand or paralyzed or amputation/joint fusion 1=Present in one limb 2=Present in two limbs 0  Sensory 0=Normal 1=Mild to moderate sensory loss 2=Severe to total sensory loss or coma/unresponsive 0  Best Language 0=No aphasia, normal 1=Mild to moderate aphasia 2=Severe aphasia 3=Mute, global aphasia, or coma/unresponsive 0  Dysarthria 0=Normal 1=Mild to moderate 2=Severe, unintelligible or mute/anarthric 0=intubated/unable to test 2  Extinction/Neglect 0=No abnormality 1=visual/tactile/auditory/spatia/personal inattention/Extinction to bilateral simultaneous stimulation 2=Profound neglect/extinction more than 1 modality  0  Total   7      Imaging Reviewed:  DG Chest 1 View  Result Date: 04/08/2021 CLINICAL DATA:  [place] this morning. Low O2 sats. Confusion for 2 days. EXAM: CHEST  1 VIEW  COMPARISON:  None. FINDINGS: Heart is enlarged but also accentuated by technique. Moderate RIGHT pleural effusion is noted, with opacification of the RIGHT lung base which obscures the RIGHT hemidiaphragm. Pulmonary vascular congestion is present. LEFT lung is otherwise clear. No pneumothorax. No acute displaced fractures. IMPRESSION: Cardiomegaly and pulmonary vascular congestion. RIGHT pleural effusion and RIGHT LOWER lobe opacity consistent with atelectasis or infiltrate. Electronically Signed   By: Nolon Nations M.D.   On: 04/08/2021 12:30   CT Head Wo Contrast  Result Date: 04/08/2021 CLINICAL DATA:  Mental status change. EXAM: CT HEAD WITHOUT CONTRAST TECHNIQUE: Contiguous axial images were obtained from the base of the skull through the vertex without intravenous contrast. COMPARISON:  None. FINDINGS: Brain: Ventricles and sulci are prominent compatible with atrophy. Periventricular and subcortical white matter hypodensities compatible with chronic microvascular ischemic changes. Low-attenuation throughout the majority of the left cerebellar hemisphere. 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 No evidence for acute intracranial hemorrhage or significant mass effect. Vascular: Unremarkable. Skull: Intact. Sinuses/Orbits: Paranasal sinuses are well aerated. Mastoid air cells are unremarkable. Other: None. IMPRESSION: Low-attenuation within the majority of the left cerebellar hemisphere may represent subacute to chronic infarct, in the absence of priors. Consider further evaluation with MRI as clinically indicated. Atrophy and chronic microvascular ischemic changes. Electronically Signed   By: Lovey Newcomer M.D.   On: 04/08/2021 12:13   DG Chest [facility]  Result Date: 04/09/2021 CLINICAL DATA:  75 year old female with history of right pleural effusion and pulmonary edema. EXAM: PORTABLE CHEST 1 VIEW COMPARISON:  Chest x-ray 04/08/2021. FINDINGS: Opacity in the base of the right  hemithorax compatible with atelectasis and/or consolidation, with superimposed moderate to large right pleural effusion. Left lung appears clear. No left pleural effusion. Cephalization of the pulmonary vasculature. Heart size is mildly enlarged. Atherosclerotic calcifications in the thoracic aorta. IMPRESSION: 1. Persistent moderate to large right pleural effusion with atelectasis and/or consolidation in the right lung base. 2. Mild cardiomegaly. 3. Aortic atherosclerosis. 1. Electronically Signed   By: Vinnie Langton M.D.   On: 04/09/2021 05:22   CT HEAD CODE STROKE WO CONTRAST  Result Date: 04/09/2021 CLINICAL DATA:  Code stroke.  Altered mental status EXAM: CT HEAD WITHOUT CONTRAST TECHNIQUE: Contiguous axial images were obtained from the base of the skull through the vertex without intravenous contrast. COMPARISON:  Head CT from yesterday FINDINGS: Brain: New cytotoxic edema in the parasagittal right frontal lobe, ACA distribution. Chronic small vessel ischemia in the hemispheric white matter with chronic basal ganglia lacunar infarcts. Chronic left larger than right cerebellar infarcts. Encephalomalacia in the left frontal lobe at the anterior cranial fossa and anterior parasagittal region, likely also post ischemic. Vascular: No hyperdense vessel or unexpected calcification. Skull: Normal. Negative for fracture or focal lesion. Sinuses/Orbits: Bilateral cataract resection Other: These results were communicated to Dr. Wynetta Emery at 9:40 am on 04/09/2021 by text page via the [facility] messaging system. IMPRESSION: 1. Acute cortical infarct is newly seen in the parasagittal right frontal lobe, ACA branch distribution. 2. Chronic ischemic injury as described, including remote left ACA territory infarcts. Electronically Signed   By: Jorje Guild M.D.   On: 04/09/2021 09:42     Labs reviewed in epic and pertinent values follow: creatinine significant elevated at 6.41 and today 6.72. Hemoglobin 8.3, today at  7.6.  Assessment:  75 year old female with history of CAD status post stent in 2013, hyperlipidemia, hypertension, diabetes on insulin, CKD with metabolic acidosis, PAF on Eliquis 2.5 twice daily and metoprolol, anemia on iron pills, 3 strokes with dysarthria admitted for AMS for 3 days.  Found to have severe renal failure, anemia and hypertensive urgency.  This morning patient was found to have left sided weakness.  Took Eliquis last night 9:46 PM.  CT head showed new developed right ACA infarct.  Patient not a TNK candidate given outside window and on Eliquis.  Not a good IR candidate given mRS = 3 and established stroke.  Given high creatinine, will do stat MRI brain MRA head and neck.  Recommendations:  Continue further stroke work up  Frequent neuro checks Telemetry monitoring MRI brain stat MRA head and neck stat Echocardiogram  UDS, fasting lipid panel and HgbA1C PT/OT/speech consult Permissive hypertension (only treat if BP > 180/105 given on Eliquis) for 24-48 hours post stroke onset GI and DVT prophylaxis  Hold off Eliquis for now given new right ACA stroke Stroke risk factor modification Further evaluation as per inpatient neurology recommendations  Discussed with Dr. Wynetta Emery attending physician   Consult Participants: RNs, patient, MD Location of the provider: [facility] Location of the patient: [facility]  This consult was provided via telemedicine with 2-way video and audio communication. The patient/family was informed that care would be provided in this way and agreed to receive care in this manner.   This patient is receiving care for possible acute neurological changes. There was 60 minutes of care by this provider  at the time of service, including time for direct evaluation via telemedicine, review of medical records, imaging studies and discussion of findings with providers, the patient and/or family.  Rosalin Hawking, MD PhD Stroke Neurology 04/09/2021 9:50 AM

## 2021-04-09 NOTE — Consult Note (Signed)
[facility] Renal Consultation Note  Requesting MD: Wynetta Emery Indication for Consultation: crt of 6  HPI:  Tiffany Valencia is a 75 y.o. female-  history is taken from chart as patient is not a good historian.  At present, there is concern for acute CVA-- code stroke in process.  Pt has relocated from [place] within the last month when family found pt in apartment soiled with urine and feces.  Patient was felt last night to have a change in MS so was brought to the ED.  She was hypoxic on room air, placed on O2- also felt to be volume overloaded.  Crt was over 6.  There is mention that patient had a nephrologist in [place] who started her on sodium bicarb.  Details regarding her past medical history are vague but felt to have DM, HTN, hyperlipidemia, CAD and Afib-  unclear how compliant with medications she has been-  listed as being on lasix 20 daily. When I see her, her speech is not clear but she is oriented to self, time and place.  There is at least 500 of UOP recorded - placed on lasix 40 IV bid   Creatinine, Ser  Date/Time Value Ref Range Status  04/09/2021 05:58 AM 6.72 (H) 0.44 - 1.00 mg/dL Final  04/08/2021 11:35 AM 6.41 (H) 0.44 - 1.00 mg/dL Final     PMHx:   Past Medical History:  Diagnosis Date   Anemia in chronic kidney disease (CKD)    Asthma    Chronic kidney disease    Coronary artery disease    Diabetes mellitus without complication ([place])    Hypertension     Past Surgical History:  Procedure Laterality Date   ABDOMINAL HYSTERECTOMY     BACK SURGERY      Family Hx: No family history on file.  Social History:  reports that she has never smoked. She has never used smokeless tobacco. She reports that she does not currently use alcohol. She reports that she does not currently use drugs.  Allergies: No Known Allergies  Medications: Prior to Admission medications   Medication Sig Start Date End Date Taking? Authorizing Provider  albuterol  (VENTOLIN HFA) 108 (90 Base) MCG/ACT inhaler Inhale into the lungs. 02/03/21  Yes [provider]  amLODipine (NORVASC) 10 MG tablet Take 10 mg by mouth daily. 01/25/21  Yes [provider]  atorvastatin (LIPITOR) 80 MG tablet Take 40 mg by mouth daily. 12/26/20  Yes [provider]  ELIQUIS 2.5 MG TABS tablet Take 2.5 mg by mouth 2 (two) times daily. 03/15/21  Yes [provider]  furosemide (LASIX) 20 MG tablet Take 20 mg by mouth daily. 03/24/21  Yes [provider]  isosorbide mononitrate (IMDUR) 60 MG 24 hr tablet Take 60 mg by mouth daily. 03/15/21  Yes [provider]  LANTUS SOLOSTAR 100 UNIT/ML Solostar Pen Inject 10 Units into the skin daily. 03/15/21  Yes [provider]  metoprolol succinate (TOPROL-XL) 100 MG 24 hr tablet Take 200 mg by mouth daily. 03/19/21  Yes [provider]  mirtazapine (REMERON) 7.5 MG tablet Take 7.5 mg by mouth at bedtime. 12/29/20  Yes [provider]  sodium bicarbonate 650 MG tablet Take 1,300 mg by mouth daily. 03/31/21  Yes [provider]  triamcinolone cream (KENALOG) 0.1 % SMARTSIG:1 Application Topical 2-3 Times Daily 03/21/21  Yes [provider]  cloNIDine (CATAPRES) 0.2 MG tablet Take 0.2 mg by mouth 3 (three) times daily. Patient not taking: No  sig reported 02/14/21   [provider]  famotidine (PEPCID) 20 MG tablet Take by mouth. Patient not taking: No sig reported 02/09/21   [provider]  folic acid (FOLVITE) 1 MG tablet Take 1 mg by mouth daily. Patient not taking: No sig reported 02/08/21   [provider]  hydrALAZINE (APRESOLINE) 100 MG tablet Take 100 mg by mouth 3 (three) times daily. Patient not taking: No sig reported 02/07/21   [provider]  hydrocortisone (ANUSOL-HC) 25 MG suppository Place 25 mg rectally 2 (two) times daily. Patient not taking: No sig reported 02/07/21   [provider]  NIFEdipine (ADALAT  CC) 30 MG 24 hr tablet Take by mouth. Patient not taking: No sig reported 10/23/20   [provider]  Vitamin D, Ergocalciferol, (DRISDOL) 1.25 MG (50000 UNIT) CAPS capsule Take by mouth. Patient not taking: No sig reported 12/29/20   [provider]    I have reviewed the patient's current medications.  Labs:  Results for orders placed or performed during the hospital encounter of 04/08/21 (from the past 48 hour(s))  Resp Panel by RT-PCR (Flu A&B, Covid) Nasopharyngeal Swab     Status: None   Collection Time: 04/08/21 10:40 AM   Specimen: Nasopharyngeal Swab; Nasopharyngeal(NP) swabs in vial transport medium  Result Value Ref Range   SARS Coronavirus 2 by RT PCR NEGATIVE NEGATIVE    Comment: (NOTE) SARS-CoV-2 target nucleic acids are NOT DETECTED.  The SARS-CoV-2 RNA is generally detectable in upper respiratory specimens during the acute phase of infection. The lowest concentration of SARS-CoV-2 viral copies this assay can detect is 138 copies/mL. A negative result does not preclude SARS-Cov-2 infection and should not be used as the sole basis for treatment or other patient management decisions. A negative result may occur with  improper specimen collection/handling, submission of specimen other than nasopharyngeal swab, presence of viral mutation(s) within the areas targeted by this assay, and inadequate number of viral copies(<138 copies/mL). A negative result must be combined with clinical observations, patient history, and epidemiological information. The expected result is Negative.  Fact Sheet for Patients:  EntrepreneurPulse.com.au  Fact Sheet for Healthcare Providers:  IncredibleEmployment.be  This test is no t yet approved or cleared by the [place] FDA and  has been authorized for detection and/or diagnosis of SARS-CoV-2 by FDA under an Emergency Use Authorization (EUA). This EUA will remain  in effect (meaning  this test can be used) for the duration of the COVID-19 declaration under Section 564(b)(1) of the Act, 21 U.S.C.section 360bbb-3(b)(1), unless the authorization is terminated  or revoked sooner.       Influenza A by PCR NEGATIVE NEGATIVE   Influenza B by PCR NEGATIVE NEGATIVE    Comment: (NOTE) The Xpert Xpress SARS-CoV-2/FLU/RSV plus assay is intended as an aid in the diagnosis of influenza from Nasopharyngeal swab specimens and should not be used as a sole basis for treatment. Nasal washings and aspirates are unacceptable for Xpert Xpress SARS-CoV-2/FLU/RSV testing.  Fact Sheet for Patients: EntrepreneurPulse.com.au  Fact Sheet for Healthcare Providers: IncredibleEmployment.be  This test is not yet approved or cleared by the [place] FDA and has been authorized for detection and/or diagnosis of SARS-CoV-2 by FDA under an Emergency Use Authorization (EUA). This EUA will remain in effect (meaning this test can be used) for the duration of the COVID-19 declaration under Section 564(b)(1) of the Act, 21 U.S.C. section 360bbb-3(b)(1), unless the authorization is terminated or revoked.  Performed at [facility],  [address], [place], [place] [address]   Comprehensive metabolic panel     Status: Abnormal   Collection Time: 04/08/21 11:35 AM  Result Value Ref Range   Sodium 136 135 - 145 mmol/L   Potassium 3.7 3.5 - 5.1 mmol/L   Chloride 111 98 - 111 mmol/L   CO2 18 (L) 22 - 32 mmol/L   Glucose, Bld 192 (H) 70 - 99 mg/dL    Comment: Glucose reference range applies only to samples taken after fasting for at least 8 hours.   BUN 50 (H) 8 - 23 mg/dL   Creatinine, Ser 6.41 (H) 0.44 - 1.00 mg/dL   Calcium 7.6 (L) 8.9 - 10.3 mg/dL   Total Protein 7.8 6.5 - 8.1 g/dL   Albumin 2.7 (L) 3.5 - 5.0 g/dL   AST 24 15 - 41 U/L   ALT 27 0 - 44 U/L   Alkaline Phosphatase 83 38 - 126 U/L   Total Bilirubin 0.6 0.3 - 1.2 mg/dL   GFR, Estimated 6 (L)  >60 mL/min    Comment: (NOTE) Calculated using the CKD-EPI Creatinine Equation (2021)    Anion gap 7 5 - 15    Comment: Performed at [facility], [address], [place], [place] [address]  Brain natriuretic peptide     Status: Abnormal   Collection Time: 04/08/21 11:35 AM  Result Value Ref Range   B Natriuretic Peptide 2,269.0 (H) 0.0 - 100.0 pg/mL    Comment: Performed at [facility], [address] [place], [place] [address]  Troponin I (High Sensitivity)     Status: None   Collection Time: 04/08/21 11:35 AM  Result Value Ref Range   Troponin I (High Sensitivity) 14 <18 ng/L    Comment: (NOTE) Elevated high sensitivity troponin I (hsTnI) values and significant  changes across serial measurements may suggest ACS but many other  chronic and acute conditions are known to elevate hsTnI results.  Refer to the "Links" section for chest pain algorithms and additional  guidance. Performed at [facility], [address] [place], [place] [address]   CBC with Differential     Status: Abnormal   Collection Time: 04/08/21 11:35 AM  Result Value Ref Range   WBC 5.5 4.0 - 10.5 K/uL   RBC 3.11 (L) 3.87 - 5.11 MIL/uL   Hemoglobin 8.3 (L) 12.0 - 15.0 g/dL   HCT 28.8 (L) 36.0 - 46.0 %   MCV 92.6 80.0 - 100.0 fL   MCH 26.7 26.0 - 34.0 pg   MCHC 28.8 (L) 30.0 - 36.0 g/dL   RDW 17.9 (H) 11.5 - 15.5 %   Platelets 153 150 - 400 K/uL   nRBC 0.0 0.0 - 0.2 %   Neutrophils Relative % 65 %   Neutro Abs 3.6 1.7 - 7.7 K/uL   Lymphocytes Relative 27 %   Lymphs Abs 1.5 0.7 - 4.0 K/uL   Monocytes Relative 6 %   Monocytes Absolute 0.3 0.1 - 1.0 K/uL   Eosinophils Relative 2 %   Eosinophils Absolute 0.1 0.0 - 0.5 K/uL   Basophils Relative 0 %   Basophils Absolute 0.0 0.0 - 0.1 K/uL   Immature Granulocytes 0 %   Abs Immature Granulocytes 0.01 0.00 - 0.07 K/uL    Comment: Performed at [facility], [address] [place], [place] [address]  TSH     Status: Abnormal   Collection Time: 04/08/21 11:35  AM  Result Value Ref Range   TSH 9.838 (H) 0.350 -  4.500 uIU/mL    Comment: Performed by a 3rd Generation assay with a functional sensitivity of <=0.01 uIU/mL. Performed at [facility], [address], [place], [place] [address]   Reticulocytes     Status: Abnormal   Collection Time: 04/08/21 11:35 AM  Result Value Ref Range   Retic Ct Pct 1.6 0.4 - 3.1 %   RBC. 3.13 (L) 3.87 - 5.11 MIL/uL   Retic Count, Absolute 51.0 19.0 - 186.0 K/uL   Immature Retic Fract 12.6 2.3 - 15.9 %    Comment: Performed at [facility], [address] [place], [place] [address]  Hemoglobin A1c     Status: Abnormal   Collection Time: 04/08/21  2:44 PM  Result Value Ref Range   Hgb A1c MFr Bld 5.9 (H) 4.8 - 5.6 %    Comment: (NOTE)         Prediabetes: 5.7 - 6.4         Diabetes: >6.4         Glycemic control for adults with diabetes: <7.0    Mean Plasma Glucose 123 mg/dL    Comment: (NOTE) Performed At: [facility] [address] [place], [place] [id number] Rush Farmer MD [id number]   VITAMIN D 25 Hydroxy (Vit-D Deficiency, Fractures)     Status: Abnormal   Collection Time: 04/08/21  2:44 PM  Result Value Ref Range   Vit D, 25-Hydroxy 104.26 (H) 30 - 100 ng/mL    Comment: (NOTE) Vitamin D deficiency has been defined by the [place] practice guideline as a level of serum 25-OH  vitamin D less than 20 ng/mL (1,2). The Endocrine Society went on to  further define vitamin D insufficiency as a level between 21 and 29  ng/mL (2).  1. IOM (Institute of Medicine). 2010. Dietary reference intakes for  calcium and D. [place]: The [employer]. 2. Holick MF, Binkley Dunmore, Bischoff-Ferrari HA, et al. Evaluation,  treatment, and prevention of vitamin D deficiency: an Endocrine  Society clinical practice guideline, JCEM. 2011 Jul; 96(7): 1911-30.  Performed at [facility], [place] [address], [place], [place] [address]   Vitamin B12      Status: Abnormal   Collection Time: 04/08/21  2:44 PM  Result Value Ref Range   Vitamin B-12 939 (H) 180 - 914 pg/mL    Comment: (NOTE) This assay is not validated for testing neonatal or myeloproliferative syndrome specimens for Vitamin B12 levels. Performed at [facility], [address] [place], [place] [address]   Folate     Status: None   Collection Time: 04/08/21  2:44 PM  Result Value Ref Range   Folate 10.7 >5.9 ng/mL    Comment: Performed at [facility], [address] [place], [place] [address]  Iron and TIBC     Status: Abnormal   Collection Time: 04/08/21  2:44 PM  Result Value Ref Range   Iron 24 (L) 28 - 170 ug/dL   TIBC 180 (L) 250 - 450 ug/dL   Saturation Ratios 13 10.4 - 31.8 %   UIBC 156 ug/dL    Comment: Performed at [facility], [address] [place],  [address]  Ferritin     Status: None   Collection Time: 04/08/21  2:44 PM  Result Value Ref Range   Ferritin 198 11 - 307 ng/mL    Comment: Performed at [facility], [address], [place],  [address]  T4, free  Status: None   Collection Time: 04/08/21  2:44 PM  Result Value Ref Range   Free T4 1.02 0.61 - 1.12 ng/dL    Comment: (NOTE) Biotin ingestion may interfere with free T4 tests. If the results are inconsistent with the TSH level, previous test results, or the clinical presentation, then consider biotin interference. If needed, order repeat testing after stopping biotin. Performed at [facility], [place] [address] [place], [place] [address]   Urinalysis, Routine w reflex microscopic Urine, Clean Catch     Status: Abnormal   Collection Time: 04/08/21  5:14 PM  Result Value Ref Range   Color, Urine YELLOW YELLOW   APPearance CLEAR CLEAR   Specific Gravity, Urine 1.014 1.005 - 1.030   pH 6.0 5.0 - 8.0   Glucose, UA 150 (A) NEGATIVE mg/dL   Hgb urine dipstick SMALL (A) NEGATIVE   Bilirubin Urine NEGATIVE NEGATIVE   Ketones, ur NEGATIVE NEGATIVE mg/dL   Protein, ur  >=300 (A) NEGATIVE mg/dL   Nitrite NEGATIVE NEGATIVE   Leukocytes,Ua NEGATIVE NEGATIVE   RBC / HPF 0-5 0 - 5 RBC/hpf   WBC, UA 0-5 0 - 5 WBC/hpf   Bacteria, UA RARE (A) NONE SEEN   Squamous Epithelial / LPF 6-10 0 - 5   Hyaline Casts, UA PRESENT     Comment: Performed at [facility], [address] [place], [place] [address]  CBG monitoring, ED     Status: Abnormal   Collection Time: 04/08/21  5:42 PM  Result Value Ref Range   Glucose-Capillary 121 (H) 70 - 99 mg/dL    Comment: Glucose reference range applies only to samples taken after fasting for at least 8 hours.  Glucose, capillary     Status: Abnormal   Collection Time: 04/08/21  9:10 PM  Result Value Ref Range   Glucose-Capillary 180 (H) 70 - 99 mg/dL    Comment: Glucose reference range applies only to samples taken after fasting for at least 8 hours.   Comment 1 Notify RN    Comment 2 Document in Chart   Glucose, capillary     Status: Abnormal   Collection Time: 04/09/21  2:56 AM  Result Value Ref Range   Glucose-Capillary 124 (H) 70 - 99 mg/dL    Comment: Glucose reference range applies only to samples taken after fasting for at least 8 hours.   Comment 1 Notify RN    Comment 2 Document in Chart   Brain natriuretic peptide     Status: Abnormal   Collection Time: 04/09/21  5:58 AM  Result Value Ref Range   B Natriuretic Peptide 2,094.0 (H) 0.0 - 100.0 pg/mL    Comment: Performed at [facility], [address], [place], [place] [address]  Magnesium     Status: None   Collection Time: 04/09/21  5:58 AM  Result Value Ref Range   Magnesium 1.8 1.7 - 2.4 mg/dL    Comment: Performed at [facility], [address] [place], [place] [address]  Renal function panel     Status: Abnormal   Collection Time: 04/09/21  5:58 AM  Result Value Ref Range   Sodium 139 135 - 145 mmol/L   Potassium 4.0 3.5 - 5.1 mmol/L   Chloride 113 (H) 98 - 111 mmol/L   CO2 21 (L) 22 - 32 mmol/L   Glucose, Bld 104 (H) 70 - 99 mg/dL    Comment:  Glucose reference range applies only to samples taken after fasting for at least  8 hours.   BUN 51 (H) 8 - 23 mg/dL   Creatinine, Ser 6.72 (H) 0.44 - 1.00 mg/dL   Calcium 7.8 (L) 8.9 - 10.3 mg/dL   Phosphorus 5.4 (H) 2.5 - 4.6 mg/dL   Albumin 2.3 (L) 3.5 - 5.0 g/dL   GFR, Estimated 6 (L) >60 mL/min    Comment: (NOTE) Calculated using the CKD-EPI Creatinine Equation (2021)    Anion gap 5 5 - 15    Comment: Performed at [facility], [address] [place], [place] [address]  CBC     Status: Abnormal   Collection Time: 04/09/21  5:58 AM  Result Value Ref Range   WBC 4.2 4.0 - 10.5 K/uL   RBC 2.84 (L) 3.87 - 5.11 MIL/uL   Hemoglobin 7.6 (L) 12.0 - 15.0 g/dL   HCT 26.2 (L) 36.0 - 46.0 %   MCV 92.3 80.0 - 100.0 fL   MCH 26.8 26.0 - 34.0 pg   MCHC 29.0 (L) 30.0 - 36.0 g/dL   RDW 18.0 (H) 11.5 - 15.5 %   Platelets 123 (L) 150 - 400 K/uL   nRBC 0.0 0.0 - 0.2 %    Comment: Performed at [facility], [address], [place], [place] [address]  Lipid panel     Status: Abnormal   Collection Time: 04/09/21  5:58 AM  Result Value Ref Range   Cholesterol 99 0 - 200 mg/dL   Triglycerides 55 <150 mg/dL   HDL 39 (L) >40 mg/dL   Total CHOL/HDL Ratio 2.5 RATIO   VLDL 11 0 - 40 mg/dL   LDL Cholesterol 49 0 - 99 mg/dL    Comment:        Total Cholesterol/HDL:CHD Risk Coronary Heart Disease Risk Table                     Men   Women  1/2 Average Risk   3.4   3.3  Average Risk       5.0   4.4  2 X Average Risk   9.6   7.1  3 X Average Risk  23.4   11.0        Use the calculated Patient Ratio above and the CHD Risk Table to determine the patient's CHD Risk.        ATP III CLASSIFICATION (LDL):  <100     mg/dL   Optimal  100-129  mg/dL   Near or Above                    Optimal  130-159  mg/dL   Borderline  160-189  mg/dL   High  >190     mg/dL   Very High Performed at [facility] [place], [place] [address]   Glucose, capillary     Status: None   Collection Time:  04/09/21  9:08 AM  Result Value Ref Range   Glucose-Capillary 75 70 - 99 mg/dL    Comment: Glucose reference range applies only to samples taken after fasting for at least 8 hours.     ROS:  Review of systems not obtained due to patient factors.  Physical Exam: Vitals:   04/09/21 0526 04/09/21 0915  BP: (!) 167/86 (!) 167/65  Pulse: 82 (!) 54  Resp: 20 18  Temp: 98.8 F (37.1 C)   SpO2: 99% 99%     General:  alert, difficult to understand speech but dont know baseline-  does seem to be  oriented to time and place HEENT: possibly a facial droop-  pupils are reactive-  very weak, not able to maneuver in bed Neck: positive for JVD Heart: irreg-  variable Lungs: poor effort-  dec BS on right Abdomen: obese, soft, non tender Extremities: pitting edema Skin: warm and dry Neuro: alert and oriented. Seems globally weak- not sure if anything focal   Assessment/Plan: 75 year old BF history of many medical issues including CKD it seems-  now with failure to thrive  1.Renal- presumably has CKD since had a nephrologist prior to moving down here and is on some "nephrology type "  meds.  Crt is elevated giving poor GFR.  There are no absolute indications for dialysis but her renal failure could certainly be playing a part in her failure to thrive.  Her BUN is not exceedingly high however so unclear how much actual immediate benefit she would get from starting HD.   Currently being worked up for CVA.  Will need to discuss with family what their expectations are.  The other issue is that she is very weak.  Not sure she could withstand the rigors of chronic dialysis therapy.  Will need to make sure they know what she will be in for if is not found to have a neurologic cause to her current issue.  Primary team is getting palliative care involved as well  2. Hypertension/volume  - BP is fine-  is overloaded with pleural effusion.  Right now on lasix 40 IV BID with some results.  Is not hypoxic.  No  change for now-  will titrate as needed 3. Anemia  - significant.  Iron is low as well.  Would like to treat with IV iron and ESA 4. Hypothyroid-  per primary-  not bad enough that I would think is playing a big role   Louis Meckel 04/09/2021, 9:45 AM

## 2021-04-09 NOTE — TOC Initial Note (Signed)
Transition of Care [facility]) - Initial/Assessment Note    Patient Details  Name: Tiffany Valencia MRN: [id number] Date of Birth: 02/25/1946  Transition of Care [facility]) CM/SW Contact:    Salome Arnt, [place] Phone Number: 04/09/2021, 10:50 AM  Clinical Narrative:  Pt admitted due to acute respiratory failure with hypoxia. TOC received consult for CHF screening. LCSW completed assessment with pt's god-sister, Ozella. Ozella reports she brought pt here from [place] a few weeks ago to live with her as pt was unable to manage on her own. Pt's grandson also lives in the home. She is fairly independent with ADLs and ambulates with a walker at baseline. Ozella plans to call her PCP to get pt established there and a cardiologist in [place]. Ozella is hopeful pt can return to her home at d/c and requests HHPT and RN. She has no preference on agency. Will refer when pt is closer to d/c.  CHF screening completed. Ozella was not aware of CHF diagnosis. TOC provided education on daily weights and she indicates pt follows heart healthy diet for the most part. She takes medications fairly well, but sometimes will ask to do them herself. TOC will order CHF book for additional education.                 Expected Discharge Plan: [place] Barriers to Discharge: Continued Medical Work up   Patient Goals and CMS Choice Patient states their goals for this hospitalization and ongoing recovery are:: return home      Expected Discharge Plan and Services Expected Discharge Plan: [place] In-house Referral: Clinical Social Work   Post Acute Care Choice: [place] arrangements for the past 2 months: [place]                 DME Arranged: N/A                    Prior Living Arrangements/Services Living arrangements for the past 2 months: Single Family Home Lives with:: Relatives Patient language and need for interpreter reviewed:: Yes Do  you feel safe going back to the place where you live?: Yes      Need for Family Participation in Patient Care: Yes (Comment) Care giver support system in place?: Yes (comment) Current home services: DME (walker) Criminal Activity/Legal Involvement Pertinent to Current Situation/Hospitalization: No - Comment as needed  Activities of Daily Living Home Assistive Devices/Equipment: Walker (specify type) ADL Screening (condition at time of admission) Patient's cognitive ability adequate to safely complete daily activities?: No Is the patient deaf or have difficulty hearing?: Yes Does the patient have difficulty seeing, even when wearing glasses/contacts?: No Does the patient have difficulty concentrating, remembering, or making decisions?: Yes Patient able to express need for assistance with ADLs?: No Does the patient have difficulty dressing or bathing?: Yes Independently performs ADLs?: No Communication: Independent Dressing (OT): Needs assistance Is this a change from baseline?: Pre-admission baseline Grooming: Needs assistance Is this a change from baseline?: Pre-admission baseline Feeding: Needs assistance Is this a change from baseline?: Pre-admission baseline Bathing: Dependent Is this a change from baseline?: Pre-admission baseline Toileting: Dependent Is this a change from baseline?: Pre-admission baseline In/Out Bed: Dependent Is this a change from baseline?: Pre-admission baseline Walks in Home: Needs assistance Is this a change from baseline?: Pre-admission baseline Does the patient have difficulty walking or climbing stairs?: Yes Weakness of Legs: Both Weakness of Arms/Hands: None  Permission Sought/Granted  Emotional Assessment   Attitude/Demeanor/Rapport: Unable to Assess Affect (typically observed): Unable to Assess   Alcohol / Substance Use: Not Applicable Psych Involvement: No (comment)  Admission diagnosis:  Acute heart failure (HCC)  [I50.9] Pulmonary edema [J81.1] SOB (shortness of breath) [R06.02] Pleural effusion, right [J90] AKI (acute kidney injury) ([place]) [N17.9] Acute on chronic systolic CHF (congestive heart failure) (HCC) [I50.23] Chronic kidney disease, unspecified CKD stage [N18.9] Patient Active Problem List   Diagnosis Date Noted   Acute heart failure ([place]) 04/08/2021   Gait instability 04/08/2021   DM (diabetes mellitus), type 2 with peripheral vascular complications ([place]) [id number]   Benign hypertension with CKD (chronic kidney disease) stage IV ([place]) 04/08/2021   Acquired thrombophilia ([place]) 04/08/2021   Chronic anticoagulation 04/08/2021   presumed Paroxysmal atrial fibrillation 04/08/2021   Hypertensive urgency 04/08/2021   Anemia in chronic kidney disease (CKD)    Pleural effusion on right    Altered mental state    Volume overload    Metabolic acidosis    Elevated brain natriuretic peptide (BNP) level    Hypoalbuminemia    Dysarthria    Coronary artery disease s/p STENT    Acute respiratory failure with hypoxia ([place])    PCP:  Pcp, No Pharmacy:   [facility] [address], [place] [address] Phone: [phone] Fax: [phone]     Social Determinants of Health (SDOH) Interventions    Readmission Risk Interventions No flowsheet data found.

## 2021-04-09 NOTE — Progress Notes (Addendum)
PROGRESS NOTE    Tiffany Valencia  [id number] DOB: 1945/10/03 DOA: 04/08/2021 PCP: Pcp, No   Brief Narrative: Tiffany Valencia is a 75 year old female recently brought to the area with family. Family report the patient was unable to care for herself in [place], brought her to [facility], [place] for the past month. Reported hx includes:cad with stent 2013, htn, hld, ckd, afib, dm, and obesity.  No medical records are available.  04/08/21 she was noted to have altered mentation and lower extremity edema. She was brought to the er via ems, noted to be hypertensive with lower extremity edema, pleural effusion on cxr. The morning of 10/3, the patient was noted to be responding differently in speech than the night before, nurse tech familiar with the patient agreed this was a different presentation, cva screen performed, code stroke called.  Assessment & Plan:   Principal Problem:  ACUTE Cerebrovascular accident (CVA) due to embolism of right anterior cerebral artery  -code stroke with head ct at 0920 04/09/21. -teleneurology contacted -head CT -MR brain with MR head and neck angiography -not a candidate for thrombolytics per neurology given patient was on apixaban and last known normal not clear  -Inpatient neurology consultation requested -Completing full stroke work up  -CTA unable to be done due to chronic renal failure     Acute diastolic heart failure -Continue IV Lasix 40 mg BID for diuresis -Monitor intake and output Daily weights -Monitor electrolytes closely -Trend cardiac BNP -Follow portable chest x-ray   Acute respiratory failure with hypoxia -Secondary to heart failure exacerbation volume overload -Secondary to large right pleural effusion -Treating supportively with diuresis - ultrasound thoracentesis done 10/3 - follow fluid studies     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 -ssi and frequent CBG monitoring  CBG (last 3)  Recent Labs     04/09/21 0908 04/09/21 1135 04/09/21 1614  GLUCAP 75 83 96     Benign hypertension  -amlodipine, clonidine, permissive hypertension x 48 hours in setting of acute CVA -reducing metoprolol and imdur with holding parameters -hydralazine iv for sbp>170    CKD (chronic kidney disease) stage IV -daily renal function labs -electrolyte correction as needed -appreciate nephrology consultation and recommendations -no indication for dialysis at this time    Anemia in chronic kidney disease (CKD) -iron supplementation  Large Pleural effusion on right -ultrasound thoracentesis performed 04/09/21    Elevated brain natriuretic peptide (BNP) level -monitoring with serial bnp's    Coronary artery disease s/p STENT -continue home statin, imdur    Paroxysmal Atrial fibrillation/Acquired thrombophilia / Chronic anticoagulation -ASA per neurology for now in setting of acute CVA  -hold apixaban for now  per neurology  DVT prophylaxis: heparin Code Status: full Family Communication: called grandson Nurse, mental health post code stroke Disposition Plan: anticipating DC to SNF    Consultants:  Neurology Nephrology   Procedures: -Ct head, code stroke -MR Angio head and neck wo contrast -MR brain wo contrast -US thoracentesis -US renal   Subjective: Pt difficult to converse with, aware of person and year  Objective: Vitals:   04/09/21 1227 04/09/21 1314 04/09/21 1358 04/09/21 1400  BP: (!) 150/62 (!) 154/64 (!) 148/60 (!) 148/63  Pulse: (!) 56 (!) 52 (!) 58 (!) 52  Resp: '18 20 20 18  '$ Temp: 97.8 F (36.6 C) 97.8 F (36.6 C)  (!) 97.4 F (36.3 C)  TempSrc: Oral Oral  Oral  SpO2: 99% 100% 100% 100%  Weight:      Height:  Intake/Output Summary (Last 24 hours) at 04/09/2021 1525 Last data filed at 04/09/2021 1400 Gross per 24 hour  Intake 1200 ml  Output 500 ml  Net 700 ml   Filed Weights   04/08/21 1017 04/09/21 0526  Weight: 86.2 kg 87.4 kg    Examination:  General  exam: Appears calm, able to answer simple questions and follow some commands  Respiratory system: Clear to auscultation. Respiratory effort normal  Cardiovascular system: S1 & S2 heard, RRR. No JVD, murmurs, rubs, gallops or clicks.   Gastrointestinal system: Abdomen is nondistended, soft and nontender. No organomegaly or masses felt. Normal bowel sounds heard.  Central nervous system: Alert and oriented to person and year  Extremities: moves right extremities, left extremities 1/5 strength  Skin: No obvious rashes or lesions  Psychiatry: Judgement and insight appear normal.    Data Reviewed: I have personally reviewed following labs and imaging studies  CBC: Recent Labs  Lab 04/08/21 1135 04/09/21 0558  WBC 5.5 4.2  NEUTROABS 3.6  --   HGB 8.3* 7.6*  HCT 28.8* 26.2*  MCV 92.6 92.3  PLT 153 [id number]*   Basic Metabolic Panel: Recent Labs  Lab 04/08/21 1135 04/09/21 0558  NA 136 139  K 3.7 4.0  CL 111 113*  CO2 18* 21*  GLUCOSE 192* 104*  BUN 50* 51*  CREATININE 6.41* 6.72*  CALCIUM 7.6* 7.8*  MG  --  1.8  PHOS  --  5.4*   GFR: Estimated Creatinine Clearance: 7.5 mL/min (A) (by C-G formula based on SCr of 6.72 mg/dL (H)). Liver Function Tests: Recent Labs  Lab 04/08/21 1135 04/09/21 0558  AST 24  --   ALT 27  --   ALKPHOS 83  --   BILITOT 0.6  --   PROT 7.8  --   ALBUMIN 2.7* 2.3*   No results for input(s): LIPASE, AMYLASE in the last 168 hours. No results for input(s): AMMONIA in the last 168 hours. Coagulation Profile: No results for input(s): INR, PROTIME in the last 168 hours. Cardiac Enzymes: No results for input(s): CKTOTAL, CKMB, CKMBINDEX, TROPONINI in the last 168 hours. BNP (last 3 results) No results for input(s): PROBNP in the last 8760 hours. HbA1C: Recent Labs    04/08/21 1444  HGBA1C 5.9*   CBG: Recent Labs  Lab 04/08/21 1742 04/08/21 2110 04/09/21 0256 04/09/21 0908 04/09/21 1135  GLUCAP 121* 180* 124* 75 83   Lipid  Profile: Recent Labs    04/09/21 0558  CHOL 99  HDL 39*  LDLCALC 49  TRIG 55  CHOLHDL 2.5   Thyroid Function Tests: Recent Labs    04/08/21 1135 04/08/21 1444  TSH 9.838*  --   FREET4  --  1.02   Anemia Panel: Recent Labs    04/08/21 1135 04/08/21 1444  VITAMINB12  --  939*  FOLATE  --  10.7  FERRITIN  --  198  TIBC  --  180*  IRON  --  24*  RETICCTPCT 1.6  --    Urine analysis:    Component Value Date/Time   COLORURINE YELLOW 04/08/2021 1714   APPEARANCEUR CLEAR 04/08/2021 1714   LABSPEC 1.014 04/08/2021 1714   PHURINE 6.0 04/08/2021 1714   GLUCOSEU 150 (A) 04/08/2021 1714   HGBUR SMALL (A) 04/08/2021 1714   BILIRUBINUR NEGATIVE 04/08/2021 1714   KETONESUR NEGATIVE 04/08/2021 1714   PROTEINUR >=300 (A) 04/08/2021 1714   NITRITE NEGATIVE 04/08/2021 1714   LEUKOCYTESUR NEGATIVE 04/08/2021 1714   Sepsis Labs: '@LABRCNTIP'$ (procalcitonin:4,lacticidven:4)  )  Recent Results (from the past 240 hour(s))  Resp Panel by RT-PCR (Flu A&B, Covid) Nasopharyngeal Swab     Status: None   Collection Time: 04/08/21 10:40 AM   Specimen: Nasopharyngeal Swab; Nasopharyngeal(NP) swabs in vial transport medium  Result Value Ref Range Status   SARS Coronavirus 2 by RT PCR NEGATIVE NEGATIVE Final    Comment: (NOTE) SARS-CoV-2 target nucleic acids are NOT DETECTED.  The SARS-CoV-2 RNA is generally detectable in upper respiratory specimens during the acute phase of infection. The lowest concentration of SARS-CoV-2 viral copies this assay can detect is 138 copies/mL. A negative result does not preclude SARS-Cov-2 infection and should not be used as the sole basis for treatment or other patient management decisions. A negative result may occur with  improper specimen collection/handling, submission of specimen other than nasopharyngeal swab, presence of viral mutation(s) within the areas targeted by this assay, and inadequate number of viral copies(<138 copies/mL). A negative  result must be combined with clinical observations, patient history, and epidemiological information. The expected result is Negative.  Fact Sheet for Patients:  EntrepreneurPulse.com.au  Fact Sheet for Healthcare Providers:  IncredibleEmployment.be  This test is no t yet approved or cleared by the [place] FDA and  has been authorized for detection and/or diagnosis of SARS-CoV-2 by FDA under an Emergency Use Authorization (EUA). This EUA will remain  in effect (meaning this test can be used) for the duration of the COVID-19 declaration under Section 564(b)(1) of the Act, 21 U.S.C.section 360bbb-3(b)(1), unless the authorization is terminated  or revoked sooner.       Influenza A by PCR NEGATIVE NEGATIVE Final   Influenza B by PCR NEGATIVE NEGATIVE Final    Comment: (NOTE) The Xpert Xpress SARS-CoV-2/FLU/RSV plus assay is intended as an aid in the diagnosis of influenza from Nasopharyngeal swab specimens and should not be used as a sole basis for treatment. Nasal washings and aspirates are unacceptable for Xpert Xpress SARS-CoV-2/FLU/RSV testing.  Fact Sheet for Patients: EntrepreneurPulse.com.au  Fact Sheet for Healthcare Providers: IncredibleEmployment.be  This test is not yet approved or cleared by the [place] FDA and has been authorized for detection and/or diagnosis of SARS-CoV-2 by FDA under an Emergency Use Authorization (EUA). This EUA will remain in effect (meaning this test can be used) for the duration of the COVID-19 declaration under Section 564(b)(1) of the Act, 21 U.S.C. section 360bbb-3(b)(1), unless the authorization is terminated or revoked.  Performed at [facility], [address], [place], [place] [address]          Radiology Studies: DG Chest 1 View  Result Date: 04/09/2021 CLINICAL DATA:  RIGHT pleural effusion post thoracentesis EXAM: CHEST  1 VIEW COMPARISON:   Exam at 1341 hours compared to 0451 hours FINDINGS: Resolution of RIGHT pleural effusion. Minimal residual bibasilar atelectasis. No pneumothorax. Enlargement of cardiac silhouette persists. Atherosclerotic calcification aorta. IMPRESSION: No pneumothorax following RIGHT thoracentesis. Minimal bibasilar atelectasis. Electronically Signed   By: Lavonia Dana M.D.   On: 04/09/2021 14:37   DG Chest 1 View  Result Date: 04/08/2021 CLINICAL DATA:  [place] this morning. Low O2 sats. Confusion for 2 days. EXAM: CHEST  1 VIEW COMPARISON:  None. FINDINGS: Heart is enlarged but also accentuated by technique. Moderate RIGHT pleural effusion is noted, with opacification of the RIGHT lung base which obscures the RIGHT hemidiaphragm. Pulmonary vascular congestion is present. LEFT lung is otherwise clear. No pneumothorax. No acute displaced fractures. IMPRESSION: Cardiomegaly and pulmonary vascular congestion. RIGHT pleural effusion and RIGHT  LOWER lobe opacity consistent with atelectasis or infiltrate. Electronically Signed   By: Nolon Nations M.D.   On: 04/08/2021 12:30   CT Head Wo Contrast  Result Date: 04/08/2021 CLINICAL DATA:  Mental status change. EXAM: CT HEAD WITHOUT CONTRAST TECHNIQUE: Contiguous axial images were obtained from the base of the skull through the vertex without intravenous contrast. COMPARISON:  None. FINDINGS: Brain: Ventricles and sulci are prominent compatible with atrophy. Periventricular and subcortical white matter hypodensities compatible with chronic microvascular ischemic changes. Low-attenuation throughout the majority of the left cerebellar hemisphere. 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 No evidence for acute intracranial hemorrhage or significant mass effect. Vascular: Unremarkable. Skull: Intact. Sinuses/Orbits: Paranasal sinuses are well aerated. Mastoid air cells are unremarkable. Other: None. IMPRESSION: Low-attenuation within the majority of the left  cerebellar hemisphere may represent subacute to chronic infarct, in the absence of priors. Consider further evaluation with MRI as clinically indicated. Atrophy and chronic microvascular ischemic changes. Electronically Signed   By: Lovey Newcomer M.D.   On: 04/08/2021 12:13   MR ANGIO HEAD WO CONTRAST  Result Date: 04/09/2021 CLINICAL DATA:  Altered mental status, does not communicate EXAM: MRI HEAD WITHOUT CONTRAST MRA HEAD WITHOUT CONTRAST MRA NECK WITHOUT CONTRAST TECHNIQUE: Multiplanar, multiecho pulse sequences of the brain and surrounding structures were obtained without intravenous contrast. Angiographic images of the Circle of Willis were obtained using MRA technique without intravenous contrast. Angiographic images of the neck were obtained using MRA technique without intravenous contrast. Carotid stenosis measurements (when applicable) are obtained utilizing NASCET criteria, using the distal internal carotid diameter as the denominator. COMPARISON:  Same-day noncontrast CT head FINDINGS: Image quality is significantly degraded by motion artifact. MRI HEAD FINDINGS Brain: There is diffusion restriction in the right ACA distribution involving the right aspect of the genu of the corpus callosum and right parasagittal frontal lobe. The FLAIR sequences markedly motion degraded, but there appears to be associated FLAIR signal abnormality. Findings consistent with evolving early subacute infarct. Susceptibility artifact along the falx on the T2* sequence is likely due to bulky dural calcifications seen on the same-day head CT. There is no convincing evidence of hemorrhagic transformation. There is a large remote infarct in the left cerebellar hemisphere and a small remote infarct in the right cerebellar hemisphere. There is a background of mild-to-moderate global parenchymal volume loss with enlargement of the ventricular system and extra-axial CSF spaces. Additional foci of T2/FLAIR signal abnormality in the  subcortical and periventricular white matter likely reflects sequela of chronic white matter microangiopathy. Vascular: The major intracranial flow voids are present. Skull and upper cervical spine: Normal marrow signal. Sinuses/Orbits: The imaged paranasal sinuses are clear. Bilateral lens implants are in place. The globes and orbits are otherwise unremarkable. Other: There is a left mastoid effusion. MRA HEAD FINDINGS Evaluation of the intracranial vasculature is markedly degraded by motion artifact. Anterior circulation: The right intracranial ICA appears patent. Evaluation of the left intracranial ICA is markedly degraded, but appears to be patent. The bilateral M1 segments are patent. The distal branches appear overall patent, but are not well evaluated. The proximal anterior cerebral arteries are patent. The distal anterior cerebral arteries are not well evaluated. Anterior circulation aneurysm can not be excluded. Posterior circulation: The V4 segments of the vertebral arteries are patent. The basilar artery is patent. The proximal PCAs are patent. The distal branches are not well evaluated. Posterior circulation aneurysms can not be excluded. Anatomic variants: The posterior communicating arteries are not definitively identified. MRA  NECK FINDINGS Evaluation of the vasculature of the neck is markedly degraded by motion artifact. Aortic arch: Not evaluated. Right carotid System: Time-of-flight MRA demonstrates antegrade flow in the right carotid system. Stenosis or dissection can not be evaluated. Left carotid System: Time-of-flight MRA demonstrates antegrade flow in the left carotid system. Stenosis or dissection can not be evaluated. Vertebral arteries: Time-of-flight MRA demonstrates antegrade flow in the vertebral arteries. Stenosis or dissection can not be evaluated. IMPRESSION: 1. Markedly motion degraded study with essentially nondiagnostic MRA of the head and neck. 2. Early subacute infarct in the  right ACA distribution without definite evidence of hemorrhagic transformation. 3. The intracranial vasculature is markedly suboptimally evaluated. The proximal vessels appear grossly patent. The distal vasculature is not evaluated. 4. Time-of-flight MRA demonstrates antegrade flow in the carotid systems and vertebral arteries. Stenoses or dissection can not be evaluated. 5. 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 6. Consider CTA of the head/neck for evaluation of the vasculature as indicated given reduced susceptibility to motion artifact. Electronically Signed   By: Valetta Mole M.D.   On: 04/09/2021 11:08   MR ANGIO NECK WO CONTRAST  Result Date: 04/09/2021 CLINICAL DATA:  Altered mental status, does not communicate EXAM: MRI HEAD WITHOUT CONTRAST MRA HEAD WITHOUT CONTRAST MRA NECK WITHOUT CONTRAST TECHNIQUE: Multiplanar, multiecho pulse sequences of the brain and surrounding structures were obtained without intravenous contrast. Angiographic images of the Circle of Willis were obtained using MRA technique without intravenous contrast. Angiographic images of the neck were obtained using MRA technique without intravenous contrast. Carotid stenosis measurements (when applicable) are obtained utilizing NASCET criteria, using the distal internal carotid diameter as the denominator. COMPARISON:  Same-day noncontrast CT head FINDINGS: Image quality is significantly degraded by motion artifact. MRI HEAD FINDINGS Brain: There is diffusion restriction in the right ACA distribution involving the right aspect of the genu of the corpus callosum and right parasagittal frontal lobe. The FLAIR sequences markedly motion degraded, but there appears to be associated FLAIR signal abnormality. Findings consistent with evolving early subacute infarct. Susceptibility artifact along the falx on the T2* sequence is likely due to bulky dural calcifications seen on the same-day head CT. There is  no convincing evidence of hemorrhagic transformation. There is a large remote infarct in the left cerebellar hemisphere and a small remote infarct in the right cerebellar hemisphere. There is a background of mild-to-moderate global parenchymal volume loss with enlargement of the ventricular system and extra-axial CSF spaces. Additional foci of T2/FLAIR signal abnormality in the subcortical and periventricular white matter likely reflects sequela of chronic white matter microangiopathy. Vascular: The major intracranial flow voids are present. Skull and upper cervical spine: Normal marrow signal. Sinuses/Orbits: The imaged paranasal sinuses are clear. Bilateral lens implants are in place. The globes and orbits are otherwise unremarkable. Other: There is a left mastoid effusion. MRA HEAD FINDINGS Evaluation of the intracranial vasculature is markedly degraded by motion artifact. Anterior circulation: The right intracranial ICA appears patent. Evaluation of the left intracranial ICA is markedly degraded, but appears to be patent. The bilateral M1 segments are patent. The distal branches appear overall patent, but are not well evaluated. The proximal anterior cerebral arteries are patent. The distal anterior cerebral arteries are not well evaluated. Anterior circulation aneurysm can not be excluded. Posterior circulation: The V4 segments of the vertebral arteries are patent. The basilar artery is patent. The proximal PCAs are patent. The distal branches are not well evaluated. Posterior circulation aneurysms can not  be excluded. Anatomic variants: The posterior communicating arteries are not definitively identified. MRA NECK FINDINGS Evaluation of the vasculature of the neck is markedly degraded by motion artifact. Aortic arch: Not evaluated. Right carotid System: Time-of-flight MRA demonstrates antegrade flow in the right carotid system. Stenosis or dissection can not be evaluated. Left carotid System: Time-of-flight  MRA demonstrates antegrade flow in the left carotid system. Stenosis or dissection can not be evaluated. Vertebral arteries: Time-of-flight MRA demonstrates antegrade flow in the vertebral arteries. Stenosis or dissection can not be evaluated. IMPRESSION: 1. Markedly motion degraded study with essentially nondiagnostic MRA of the head and neck. 2. Early subacute infarct in the right ACA distribution without definite evidence of hemorrhagic transformation. 3. The intracranial vasculature is markedly suboptimally evaluated. The proximal vessels appear grossly patent. The distal vasculature is not evaluated. 4. Time-of-flight MRA demonstrates antegrade flow in the carotid systems and vertebral arteries. Stenoses or dissection can not be evaluated. 5. 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 6. Consider CTA of the head/neck for evaluation of the vasculature as indicated given reduced susceptibility to motion artifact. Electronically Signed   By: Valetta Mole M.D.   On: 04/09/2021 11:08   MR BRAIN WO CONTRAST  Result Date: 04/09/2021 CLINICAL DATA:  Altered mental status, does not communicate EXAM: MRI HEAD WITHOUT CONTRAST MRA HEAD WITHOUT CONTRAST MRA NECK WITHOUT CONTRAST TECHNIQUE: Multiplanar, multiecho pulse sequences of the brain and surrounding structures were obtained without intravenous contrast. Angiographic images of the Circle of Willis were obtained using MRA technique without intravenous contrast. Angiographic images of the neck were obtained using MRA technique without intravenous contrast. Carotid stenosis measurements (when applicable) are obtained utilizing NASCET criteria, using the distal internal carotid diameter as the denominator. COMPARISON:  Same-day noncontrast CT head FINDINGS: Image quality is significantly degraded by motion artifact. MRI HEAD FINDINGS Brain: There is diffusion restriction in the right ACA distribution involving the right aspect of  the genu of the corpus callosum and right parasagittal frontal lobe. The FLAIR sequences markedly motion degraded, but there appears to be associated FLAIR signal abnormality. Findings consistent with evolving early subacute infarct. Susceptibility artifact along the falx on the T2* sequence is likely due to bulky dural calcifications seen on the same-day head CT. There is no convincing evidence of hemorrhagic transformation. There is a large remote infarct in the left cerebellar hemisphere and a small remote infarct in the right cerebellar hemisphere. There is a background of mild-to-moderate global parenchymal volume loss with enlargement of the ventricular system and extra-axial CSF spaces. Additional foci of T2/FLAIR signal abnormality in the subcortical and periventricular white matter likely reflects sequela of chronic white matter microangiopathy. Vascular: The major intracranial flow voids are present. Skull and upper cervical spine: Normal marrow signal. Sinuses/Orbits: The imaged paranasal sinuses are clear. Bilateral lens implants are in place. The globes and orbits are otherwise unremarkable. Other: There is a left mastoid effusion. MRA HEAD FINDINGS Evaluation of the intracranial vasculature is markedly degraded by motion artifact. Anterior circulation: The right intracranial ICA appears patent. Evaluation of the left intracranial ICA is markedly degraded, but appears to be patent. The bilateral M1 segments are patent. The distal branches appear overall patent, but are not well evaluated. The proximal anterior cerebral arteries are patent. The distal anterior cerebral arteries are not well evaluated. Anterior circulation aneurysm can not be excluded. Posterior circulation: The V4 segments of the vertebral arteries are patent. The basilar artery is patent. The proximal PCAs are patent.  The distal branches are not well evaluated. Posterior circulation aneurysms can not be excluded. Anatomic variants: The  posterior communicating arteries are not definitively identified. MRA NECK FINDINGS Evaluation of the vasculature of the neck is markedly degraded by motion artifact. Aortic arch: Not evaluated. Right carotid System: Time-of-flight MRA demonstrates antegrade flow in the right carotid system. Stenosis or dissection can not be evaluated. Left carotid System: Time-of-flight MRA demonstrates antegrade flow in the left carotid system. Stenosis or dissection can not be evaluated. Vertebral arteries: Time-of-flight MRA demonstrates antegrade flow in the vertebral arteries. Stenosis or dissection can not be evaluated. IMPRESSION: 1. Markedly motion degraded study with essentially nondiagnostic MRA of the head and neck. 2. Early subacute infarct in the right ACA distribution without definite evidence of hemorrhagic transformation. 3. The intracranial vasculature is markedly suboptimally evaluated. The proximal vessels appear grossly patent. The distal vasculature is not evaluated. 4. Time-of-flight MRA demonstrates antegrade flow in the carotid systems and vertebral arteries. Stenoses or dissection can not be evaluated. 5. 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 6. Consider CTA of the head/neck for evaluation of the vasculature as indicated given reduced susceptibility to motion artifact. Electronically Signed   By: Valetta Mole M.D.   On: 04/09/2021 11:08   US RENAL  Result Date: 04/09/2021 CLINICAL DATA:  Pulmonary edema, pleural effusion, diabetes mellitus, hypertension, altered mental EXAM: RENAL / URINARY TRACT ULTRASOUND COMPLETE COMPARISON:  None FINDINGS: Right Kidney: Renal measurements: 10.3 x 5.6 x 5.5 cm = volume: 164 mL. Normal cortical thickness. Significantly increased cortical echogenicity. No hydronephrosis or shadowing calcification. Multiple RIGHT renal cysts, largest 2.2 cm and 2.3 cm in greatest sizes. Left Kidney: No LEFT kidney visualized, question obscured,  ectopic, or surgically versus developmentally absent. Bladder: Appears normal for degree of bladder distention. Other: N/A IMPRESSION: Nonvisualization of LEFT kidney. Medical renal disease changes RIGHT kidney with multiple renal cysts. Electronically Signed   By: Lavonia Dana M.D.   On: 04/09/2021 14:39   DG Chest Port 1 View  Result Date: 04/09/2021 CLINICAL DATA:  75 year old female with history of right pleural effusion and pulmonary edema. EXAM: PORTABLE CHEST 1 VIEW COMPARISON:  Chest x-ray 04/08/2021. FINDINGS: Opacity in the base of the right hemithorax compatible with atelectasis and/or consolidation, with superimposed moderate to large right pleural effusion. Left lung appears clear. No left pleural effusion. Cephalization of the pulmonary vasculature. Heart size is mildly enlarged. Atherosclerotic calcifications in the thoracic aorta. IMPRESSION: 1. Persistent moderate to large right pleural effusion with atelectasis and/or consolidation in the right lung base. 2. Mild cardiomegaly. 3. Aortic atherosclerosis. 1. Electronically Signed   By: Vinnie Langton M.D.   On: 04/09/2021 05:22   CT HEAD CODE STROKE WO CONTRAST  Result Date: 04/09/2021 CLINICAL DATA:  Code stroke.  Altered mental status EXAM: CT HEAD WITHOUT CONTRAST TECHNIQUE: Contiguous axial images were obtained from the base of the skull through the vertex without intravenous contrast. COMPARISON:  Head CT from yesterday FINDINGS: Brain: New cytotoxic edema in the parasagittal right frontal lobe, ACA distribution. Chronic small vessel ischemia in the hemispheric white matter with chronic basal ganglia lacunar infarcts. Chronic left larger than right cerebellar infarcts. Encephalomalacia in the left frontal lobe at the anterior cranial fossa and anterior parasagittal region, likely also post ischemic. Vascular: No hyperdense vessel or unexpected calcification. Skull: Normal. Negative for fracture or focal lesion. Sinuses/Orbits: Bilateral  cataract resection Other: These results were communicated to Dr. Wynetta Emery at 9:40 am on 04/09/2021 by  text page via the [facility] messaging system. IMPRESSION: 1. Acute cortical infarct is newly seen in the parasagittal right frontal lobe, ACA branch distribution. 2. Chronic ischemic injury as described, including remote left ACA territory infarcts. Electronically Signed   By: Jorje Guild M.D.   On: 04/09/2021 09:42   US THORACENTESIS ASP PLEURAL SPACE W/IMG GUIDE  Result Date: 04/09/2021 INDICATION: RIGHT pleural effusion EXAM: ULTRASOUND GUIDED DIAGNOSTIC AND THERAPEUTIC RIGHT THORACENTESIS MEDICATIONS: None. COMPLICATIONS: None immediate. PROCEDURE: An ultrasound guided thoracentesis was thoroughly discussed with the patient and questions answered. The benefits, risks, alternatives and complications were also discussed. The patient understands and wishes to proceed with the procedure. Written consent was obtained. Ultrasound was performed to localize and mark an adequate pocket of fluid in the RIGHT chest. The area was then prepped and draped in the normal sterile fashion. 1% Lidocaine was used for local anesthesia. Under ultrasound guidance a 5 [place] Yueh catheter was introduced. Thoracentesis was performed. The catheter was removed and a dressing applied. FINDINGS: A total of approximately 1 L of yellow RIGHT pleural fluid was removed. Samples were sent to the laboratory as requested by the clinical team. IMPRESSION: Successful ultrasound guided RIGHT thoracentesis yielding 1 L of pleural fluid. Electronically Signed   By: Lavonia Dana M.D.   On: 04/09/2021 14:23    Scheduled Meds:  amLODipine  10 mg Oral Daily   aspirin  300 mg Rectal Daily   Or   aspirin EC  325 mg Oral Daily   atorvastatin  40 mg Oral Daily   cloNIDine  0.2 mg Oral TID   dextrose       furosemide  40 mg Intravenous Q12H   heparin  5,000 Units Subcutaneous Q8H   insulin aspart  0-6 Units Subcutaneous TID WC   insulin  glargine-yfgn  8 Units Subcutaneous QHS   isosorbide mononitrate  60 mg Oral Daily   metoprolol succinate  100 mg Oral Daily   mirtazapine  7.5 mg Oral QHS   pantoprazole  40 mg Oral Daily   sodium bicarbonate  650 mg Oral TID   Continuous Infusions:  ferric gluconate (FERRLECIT) IVPB       LOS: 1 day   Christian D Parkinson, student [facility] Pager [phone]  If 7PM-7AM, please contact night-coverage [email or web address] Password [facility] 04/09/2021, 3:25 PM    ATTENDING NOTE  Patient seen and examined with Medical student. In addition to supervising the encounter, I played a key role in the decision making process as well as reviewed key findings and made adjustments to documentation as needed.  I discussed plan of care with teleneurologist.   Gerlene Fee, MD FAAFP How to contact the [facility] Attending or Consulting provider [place] or covering provider during after hours [place], for this patient?  Check the care team in [facility] and look for a) attending/consulting [facility] provider listed and b) the [facility] team listed Log into [email or web address] and use [place] universal password to access. If you do not have the password, please contact the hospital operator. Locate the [facility] provider you are looking for under [facility] and page to a number that you can be directly reached. If you still have difficulty reaching the provider, please page the [facility] (Director on Call) for the Hospitalists listed on amion for assistance.

## 2021-04-09 NOTE — Plan of Care (Signed)

## 2021-04-09 NOTE — Procedures (Signed)
PreOperative Dx: RIGHT pleural effusion Postoperative Dx: RIGHT pleural effusion Procedure:   US guided RIGHT thoracentesis Radiologist:  Thornton Papas Anesthesia:  10 ml of 1% lidocaine Specimen:  1 L of yellow colored fluid EBL:   < 1 ml Complications: None

## 2021-04-09 NOTE — Progress Notes (Signed)
Patient tolerated right sided thoracentesis procedure well today and 1 liter of clear yellow fluid removed with vital signs remaining stable. Post chest xray completed and pt returned to inpatient bed assignment with NAD.

## 2021-04-09 NOTE — Plan of Care (Signed)
MRI and MRA reviewed.  MRI showed established subacute or early subacute right ACA infarct, no hemorrhage.  However MRA with significant motion artifact, not ideal to evaluate vasculature.  There is a questionable right mid A2 occlusion but not for sure.  Patient not candidate for CTA head and neck at this time due to significantly elevated creatinine.  Anyway, patient not candidate for thrombectomy due to established right ACA stroke, baseline mRS =3 and no definite evidence of LVO at this time.  We will continue further stroke work-up and management per consult note.  I called patient grandson Elta Guadeloupe and updated him about patient current treatment plan.  I also discussed with Dr. Wynetta Emery over the secure messaging system.  Rosalin Hawking, MD PhD Stroke Neurology 04/09/2021 1:45 PM

## 2021-04-09 NOTE — Progress Notes (Signed)
*  PRELIMINARY RESULTS* Echocardiogram 2D Echocardiogram has been performed.  Tiffany Valencia 04/09/2021, 9:25 AM

## 2021-04-09 NOTE — Progress Notes (Signed)
Patient received back from US-Thoracentesis, sleeping, arousable and following commands.  Assisted in position of comfort, needs addressed.

## 2021-04-09 NOTE — Progress Notes (Addendum)
On rounds for meds, noticed patient's slurred speech.  Per NT, "patient was not like this yesterday".  Echocardiogram currently performed at bedside.  Neuro assessment done, Dr. Wynetta Emery at bedside.  Patient alert and oriented x 4, able to answer questions and follow commands. Slurred speech continues, with left sided weakness, no visual or sensation loss at this time.  Code stroke called by charge RN.  No distress.

## 2021-04-09 NOTE — Consult Note (Signed)
[place] A. Merlene Laughter, MD     [email or web address]          Tiffany Valencia is an 75 y.o. female.   ASSESSMENT/PLAN:   Acute multifactorial encephalopathy including multiple metabolic derangements and acute right frontal ACA distribution infarct in the setting of chronic atrial fibrillation. The patient anticoagulation has been help. This is recommended to be started 3 days after the index event. Avoid drop in blood pressure.  Remote cerebral infarcts bilaterally  Chronic atrial fibrillation     The patient was found unresponsive on the floor. She was confused and may have fell. During the workup the patient was noted to multiple metabolic derangements including acute renal failure, dehydration and anemia. The patient was noted to what may have been acute left-sided weakness during the hospitalization. A stroke code was initiated. Workup revealed acute ischemic stroke involving the right medial frontal lobe approximating the ACA distribution. It appears that the patient cognition has improved with appropriate treatment. She has been given IV infusion of iron. She reports being called in weak throughout does not complain focal weakness although she seemed to clearly has some left-sided weakness indicating some evidence of neglect. No reports of dyspnea, chest pain or palpitation reported. The review of systems is limited unrevealing.  GENERAL:  This is a pleasant obese female is resting in bed.  HEENT:  Normal  ABDOMEN: soft  EXTREMITIES: No edema   BACK:  normal  SKIN: Normal by inspection.    MENTAL STATUS:  She lays in bed with eyes closed. She does open her eyes to mild sternal rub. She is oriented to person, place/hospital and time including the urine month. She states her age appropriately. She does seem to have mild dysarthria. Language is preserved.  CRANIAL NERVES: Pupils are equal, round and reactive to light and accomodation; extra ocular movements are  full, there is no significant nystagmus; visual fields are full; upper and lower facial muscles are normal in strength and symmetric, there is no flattening of the nasolabial folds; tongue is midline; uvula is midline; shoulder elevation is normal.  MOTOR:  Right upper extremity strength graded as 4/5 with no drift. Left upper extremity 3/5 with significant drift. Legs are graded as 2/5.  COORDINATION: Left finger to nose is normal, right finger to nose is normal, No rest tremor; no intention tremor; no postural tremor; no bradykinesia.  REFLEXES: Deep tendon reflexes are symmetrical and normal.   SENSATION: Normal to pain.      Blood pressure (!) 154/66, pulse (!) 51, temperature (!) 97.3 F (36.3 C), temperature source Oral, resp. rate 18, height 5' 3.5" (1.613 m), weight 87.4 kg, SpO2 100 %.  Past Medical History:  Diagnosis Date   Anemia in chronic kidney disease (CKD)    Asthma    Chronic kidney disease    Coronary artery disease    Diabetes mellitus without complication ([place])    Hypertension     Past Surgical History:  Procedure Laterality Date   ABDOMINAL HYSTERECTOMY     BACK SURGERY      No family history on file.  Social History:  reports that she has never smoked. She has never used smokeless tobacco. She reports that she does not currently use alcohol. She reports that she does not currently use drugs.  Allergies: No Known Allergies  Medications: Prior to Admission medications   Medication Sig Start Date End Date Taking? Authorizing Provider  albuterol (VENTOLIN HFA) 108 (90 Base) MCG/ACT inhaler Inhale into the  lungs. 02/03/21  Yes [provider]  amLODipine (NORVASC) 10 MG tablet Take 10 mg by mouth daily. 01/25/21  Yes [provider]  atorvastatin (LIPITOR) 80 MG tablet Take 40 mg by mouth daily. 12/26/20  Yes [provider]  ELIQUIS 2.5 MG TABS tablet Take 2.5 mg by mouth 2 (two) times daily. 03/15/21  Yes [provider]   furosemide (LASIX) 20 MG tablet Take 20 mg by mouth daily. 03/24/21  Yes [provider]  isosorbide mononitrate (IMDUR) 60 MG 24 hr tablet Take 60 mg by mouth daily. 03/15/21  Yes [provider]  LANTUS SOLOSTAR 100 UNIT/ML Solostar Pen Inject 10 Units into the skin daily. 03/15/21  Yes [provider]  metoprolol succinate (TOPROL-XL) 100 MG 24 hr tablet Take 200 mg by mouth daily. 03/19/21  Yes [provider]  mirtazapine (REMERON) 7.5 MG tablet Take 7.5 mg by mouth at bedtime. 12/29/20  Yes [provider]  sodium bicarbonate 650 MG tablet Take 1,300 mg by mouth daily. 03/31/21  Yes [provider]  triamcinolone cream (KENALOG) 0.1 % SMARTSIG:1 Application Topical 2-3 Times Daily 03/21/21  Yes [provider]  cloNIDine (CATAPRES) 0.2 MG tablet Take 0.2 mg by mouth 3 (three) times daily. Patient not taking: No sig reported 02/14/21   [provider]  famotidine (PEPCID) 20 MG tablet Take by mouth. Patient not taking: No sig reported 02/09/21   [provider]  folic acid (FOLVITE) 1 MG tablet Take 1 mg by mouth daily. Patient not taking: No sig reported 02/08/21   [provider]  hydrALAZINE (APRESOLINE) 100 MG tablet Take 100 mg by mouth 3 (three) times daily. Patient not taking: No sig reported 02/07/21   [provider]  hydrocortisone (ANUSOL-HC) 25 MG suppository Place 25 mg rectally 2 (two) times daily. Patient not taking: No sig reported 02/07/21   [provider]  NIFEdipine (ADALAT CC) 30 MG 24 hr tablet Take by mouth. Patient not taking: No sig reported 10/23/20   [provider]  Vitamin D, Ergocalciferol, (DRISDOL) 1.25 MG (50000 UNIT) CAPS capsule Take by mouth. Patient not taking: No sig reported 12/29/20   [provider]    Scheduled Meds:  [START ON 04/10/2021] amLODipine  5 mg Oral Daily   aspirin  300 mg Rectal Daily   Or   aspirin EC  325 mg Oral Daily    atorvastatin  40 mg Oral Daily   cloNIDine  0.1 mg Oral BID   dextrose       furosemide  40 mg Intravenous Q12H   heparin  5,000 Units Subcutaneous Q8H   insulin aspart  0-6 Units Subcutaneous TID WC   insulin glargine-yfgn  4 Units Subcutaneous QHS   [START ON 04/10/2021] isosorbide mononitrate  30 mg Oral Daily   [START ON 04/10/2021] metoprolol succinate  50 mg Oral Daily   mirtazapine  7.5 mg Oral QHS   pantoprazole  40 mg Oral Daily   sodium bicarbonate  650 mg Oral TID   Continuous Infusions:  ferric gluconate (FERRLECIT) IVPB 250 mg (04/09/21 1530)   PRN Meds:.acetaminophen **OR** acetaminophen, albuterol, bisacodyl, hydrALAZINE, ondansetron **OR** ondansetron (ZOFRAN) IV, oxyCODONE, traZODone     Results for orders placed or performed during the hospital encounter of 04/08/21 (from the past 48 hour(s))  Resp Panel by RT-PCR (Flu A&B, Covid) Nasopharyngeal Swab     Status: None   Collection Time: 04/08/21 10:40 AM   Specimen: Nasopharyngeal Swab; Nasopharyngeal(NP) swabs in  vial transport medium  Result Value Ref Range   SARS Coronavirus 2 by RT PCR NEGATIVE NEGATIVE    Comment: (NOTE) SARS-CoV-2 target nucleic acids are NOT DETECTED.  The SARS-CoV-2 RNA is generally detectable in upper respiratory specimens during the acute phase of infection. The lowest concentration of SARS-CoV-2 viral copies this assay can detect is 138 copies/mL. A negative result does not preclude SARS-Cov-2 infection and should not be used as the sole basis for treatment or other patient management decisions. A negative result may occur with  improper specimen collection/handling, submission of specimen other than nasopharyngeal swab, presence of viral mutation(s) within the areas targeted by this assay, and inadequate number of viral copies(<138 copies/mL). A negative result must be combined with clinical observations, patient history, and epidemiological information. The expected result is  Negative.  Fact Sheet for Patients:  EntrepreneurPulse.com.au  Fact Sheet for Healthcare Providers:  IncredibleEmployment.be  This test is no t yet approved or cleared by the [place] FDA and  has been authorized for detection and/or diagnosis of SARS-CoV-2 by FDA under an Emergency Use Authorization (EUA). This EUA will remain  in effect (meaning this test can be used) for the duration of the COVID-19 declaration under Section 564(b)(1) of the Act, 21 U.S.C.section 360bbb-3(b)(1), unless the authorization is terminated  or revoked sooner.       Influenza A by PCR NEGATIVE NEGATIVE   Influenza B by PCR NEGATIVE NEGATIVE    Comment: (NOTE) The Xpert Xpress SARS-CoV-2/FLU/RSV plus assay is intended as an aid in the diagnosis of influenza from Nasopharyngeal swab specimens and should not be used as a sole basis for treatment. Nasal washings and aspirates are unacceptable for Xpert Xpress SARS-CoV-2/FLU/RSV testing.  Fact Sheet for Patients: EntrepreneurPulse.com.au  Fact Sheet for Healthcare Providers: IncredibleEmployment.be  This test is not yet approved or cleared by the [place] FDA and has been authorized for detection and/or diagnosis of SARS-CoV-2 by FDA under an Emergency Use Authorization (EUA). This EUA will remain in effect (meaning this test can be used) for the duration of the COVID-19 declaration under Section 564(b)(1) of the Act, 21 U.S.C. section 360bbb-3(b)(1), unless the authorization is terminated or revoked.  Performed at [facility], [address], [place], [place] [address]   Comprehensive metabolic panel     Status: Abnormal   Collection Time: 04/08/21 11:35 AM  Result Value Ref Range   Sodium 136 135 - 145 mmol/L   Potassium 3.7 3.5 - 5.1 mmol/L   Chloride 111 98 - 111 mmol/L   CO2 18 (L) 22 - 32 mmol/L   Glucose, Bld 192 (H) 70 - 99 mg/dL    Comment: Glucose  reference range applies only to samples taken after fasting for at least 8 hours.   BUN 50 (H) 8 - 23 mg/dL   Creatinine, Ser 6.41 (H) 0.44 - 1.00 mg/dL   Calcium 7.6 (L) 8.9 - 10.3 mg/dL   Total Protein 7.8 6.5 - 8.1 g/dL   Albumin 2.7 (L) 3.5 - 5.0 g/dL   AST 24 15 - 41 U/L   ALT 27 0 - 44 U/L   Alkaline Phosphatase 83 38 - 126 U/L   Total Bilirubin 0.6 0.3 - 1.2 mg/dL   GFR, Estimated 6 (L) >60 mL/min    Comment: (NOTE) Calculated using the CKD-EPI Creatinine Equation (2021)    Anion gap 7 5 - 15    Comment: Performed at [facility], [address] [place], [place] [address]  Brain natriuretic  peptide     Status: Abnormal   Collection Time: 04/08/21 11:35 AM  Result Value Ref Range   B Natriuretic Peptide 2,269.0 (H) 0.0 - 100.0 pg/mL    Comment: Performed at [facility], [address] [place], [place] [address]  Troponin I (High Sensitivity)     Status: None   Collection Time: 04/08/21 11:35 AM  Result Value Ref Range   Troponin I (High Sensitivity) 14 <18 ng/L    Comment: (NOTE) Elevated high sensitivity troponin I (hsTnI) values and significant  changes across serial measurements may suggest ACS but many other  chronic and acute conditions are known to elevate hsTnI results.  Refer to the "Links" section for chest pain algorithms and additional  guidance. Performed at [facility], [address] [place], [place] [address]   CBC with Differential     Status: Abnormal   Collection Time: 04/08/21 11:35 AM  Result Value Ref Range   WBC 5.5 4.0 - 10.5 K/uL   RBC 3.11 (L) 3.87 - 5.11 MIL/uL   Hemoglobin 8.3 (L) 12.0 - 15.0 g/dL   HCT 28.8 (L) 36.0 - 46.0 %   MCV 92.6 80.0 - 100.0 fL   MCH 26.7 26.0 - 34.0 pg   MCHC 28.8 (L) 30.0 - 36.0 g/dL   RDW 17.9 (H) 11.5 - 15.5 %   Platelets 153 150 - 400 K/uL   nRBC 0.0 0.0 - 0.2 %   Neutrophils Relative % 65 %   Neutro Abs 3.6 1.7 - 7.7 K/uL   Lymphocytes Relative 27 %   Lymphs Abs 1.5 0.7 - 4.0 K/uL   Monocytes Relative  6 %   Monocytes Absolute 0.3 0.1 - 1.0 K/uL   Eosinophils Relative 2 %   Eosinophils Absolute 0.1 0.0 - 0.5 K/uL   Basophils Relative 0 %   Basophils Absolute 0.0 0.0 - 0.1 K/uL   Immature Granulocytes 0 %   Abs Immature Granulocytes 0.01 0.00 - 0.07 K/uL    Comment: Performed at [facility], [address], [place], [place] [address]  TSH     Status: Abnormal   Collection Time: 04/08/21 11:35 AM  Result Value Ref Range   TSH 9.838 (H) 0.350 - 4.500 uIU/mL    Comment: Performed by a 3rd Generation assay with a functional sensitivity of <=0.01 uIU/mL. Performed at [facility], [address], [place], [place] [address]   Reticulocytes     Status: Abnormal   Collection Time: 04/08/21 11:35 AM  Result Value Ref Range   Retic Ct Pct 1.6 0.4 - 3.1 %   RBC. 3.13 (L) 3.87 - 5.11 MIL/uL   Retic Count, Absolute 51.0 19.0 - 186.0 K/uL   Immature Retic Fract 12.6 2.3 - 15.9 %    Comment: Performed at [facility], [address] [place], [place] [address]  Hemoglobin A1c     Status: Abnormal   Collection Time: 04/08/21  2:44 PM  Result Value Ref Range   Hgb A1c MFr Bld 5.9 (H) 4.8 - 5.6 %    Comment: (NOTE)         Prediabetes: 5.7 - 6.4         Diabetes: >6.4         Glycemic control for adults with diabetes: <7.0    Mean Plasma Glucose 123 mg/dL    Comment: (NOTE) Performed At: [facility] [address] [place], [place] [id number] Rush Farmer MD [id number]   VITAMIN D 25 Hydroxy (Vit-D Deficiency, Fractures)  Status: Abnormal   Collection Time: 04/08/21  2:44 PM  Result Value Ref Range   Vit D, 25-Hydroxy 104.26 (H) 30 - 100 ng/mL    Comment: (NOTE) Vitamin D deficiency has been defined by the [place] practice guideline as a level of serum 25-OH  vitamin D less than 20 ng/mL (1,2). The Endocrine Society went on to  further define vitamin D insufficiency as a level between 21 and 29  ng/mL (2).  1. IOM (Institute of  Medicine). 2010. Dietary reference intakes for  calcium and D. [place]: The [employer]. 2. Holick MF, Binkley [place], Bischoff-Ferrari HA, et al. Evaluation,  treatment, and prevention of vitamin D deficiency: an Endocrine  Society clinical practice guideline, JCEM. 2011 Jul; 96(7): 1911-30.  Performed at [facility], [place] [address], [place], [place] [address]   Vitamin B12     Status: Abnormal   Collection Time: 04/08/21  2:44 PM  Result Value Ref Range   Vitamin B-12 939 (H) 180 - 914 pg/mL    Comment: (NOTE) This assay is not validated for testing neonatal or myeloproliferative syndrome specimens for Vitamin B12 levels. Performed at [facility], [address], [place], [place] [address]   Folate     Status: None   Collection Time: 04/08/21  2:44 PM  Result Value Ref Range   Folate 10.7 >5.9 ng/mL    Comment: Performed at [facility], [address], [place], [place] [address]  Iron and TIBC     Status: Abnormal   Collection Time: 04/08/21  2:44 PM  Result Value Ref Range   Iron 24 (L) 28 - 170 ug/dL   TIBC 180 (L) 250 - 450 ug/dL   Saturation Ratios 13 10.4 - 31.8 %   UIBC 156 ug/dL    Comment: Performed at [facility], [address] [place], [place] [address]  Ferritin     Status: None   Collection Time: 04/08/21  2:44 PM  Result Value Ref Range   Ferritin 198 11 - 307 ng/mL    Comment: Performed at [facility], [address], [place], [place] [address]  T4, free     Status: None   Collection Time: 04/08/21  2:44 PM  Result Value Ref Range   Free T4 1.02 0.61 - 1.12 ng/dL    Comment: (NOTE) Biotin ingestion may interfere with free T4 tests. If the results are inconsistent with the TSH level, previous test results, or the clinical presentation, then consider biotin interference. If needed, order repeat testing after stopping biotin. Performed at [facility], [place] [address], [place],  [address]   Urinalysis, Routine w reflex  microscopic Urine, Clean Catch     Status: Abnormal   Collection Time: 04/08/21  5:14 PM  Result Value Ref Range   Color, Urine YELLOW YELLOW   APPearance CLEAR CLEAR   Specific Gravity, Urine 1.014 1.005 - 1.030   pH 6.0 5.0 - 8.0   Glucose, UA 150 (A) NEGATIVE mg/dL   Hgb urine dipstick SMALL (A) NEGATIVE   Bilirubin Urine NEGATIVE NEGATIVE   Ketones, ur NEGATIVE NEGATIVE mg/dL   Protein, ur >=300 (A) NEGATIVE mg/dL   Nitrite NEGATIVE NEGATIVE   Leukocytes,Ua NEGATIVE NEGATIVE   RBC / HPF 0-5 0 - 5 RBC/hpf   WBC, UA 0-5 0 - 5 WBC/hpf   Bacteria, UA RARE (A) NONE SEEN   Squamous Epithelial / LPF 6-10 0 - 5  Hyaline Casts, UA PRESENT     Comment: Performed at [facility], [address] [place], [place] [address]  CBG monitoring, ED     Status: Abnormal   Collection Time: 04/08/21  5:42 PM  Result Value Ref Range   Glucose-Capillary 121 (H) 70 - 99 mg/dL    Comment: Glucose reference range applies only to samples taken after fasting for at least 8 hours.  Glucose, capillary     Status: Abnormal   Collection Time: 04/08/21  9:10 PM  Result Value Ref Range   Glucose-Capillary 180 (H) 70 - 99 mg/dL    Comment: Glucose reference range applies only to samples taken after fasting for at least 8 hours.   Comment 1 Notify RN    Comment 2 Document in Chart   Glucose, capillary     Status: Abnormal   Collection Time: 04/09/21  2:56 AM  Result Value Ref Range   Glucose-Capillary 124 (H) 70 - 99 mg/dL    Comment: Glucose reference range applies only to samples taken after fasting for at least 8 hours.   Comment 1 Notify RN    Comment 2 Document in Chart   Brain natriuretic peptide     Status: Abnormal   Collection Time: 04/09/21  5:58 AM  Result Value Ref Range   B Natriuretic Peptide 2,094.0 (H) 0.0 - 100.0 pg/mL    Comment: Performed at [facility], [address], [place], [place] [address]  Magnesium     Status: None   Collection Time: 04/09/21  5:58 AM  Result Value Ref Range    Magnesium 1.8 1.7 - 2.4 mg/dL    Comment: Performed at [facility], [address] [place], [place] [address]  Renal function panel     Status: Abnormal   Collection Time: 04/09/21  5:58 AM  Result Value Ref Range   Sodium 139 135 - 145 mmol/L   Potassium 4.0 3.5 - 5.1 mmol/L   Chloride 113 (H) 98 - 111 mmol/L   CO2 21 (L) 22 - 32 mmol/L   Glucose, Bld 104 (H) 70 - 99 mg/dL    Comment: Glucose reference range applies only to samples taken after fasting for at least 8 hours.   BUN 51 (H) 8 - 23 mg/dL   Creatinine, Ser 6.72 (H) 0.44 - 1.00 mg/dL   Calcium 7.8 (L) 8.9 - 10.3 mg/dL   Phosphorus 5.4 (H) 2.5 - 4.6 mg/dL   Albumin 2.3 (L) 3.5 - 5.0 g/dL   GFR, Estimated 6 (L) >60 mL/min    Comment: (NOTE) Calculated using the CKD-EPI Creatinine Equation (2021)    Anion gap 5 5 - 15    Comment: Performed at [facility], [address], [place], [place] [address]  CBC     Status: Abnormal   Collection Time: 04/09/21  5:58 AM  Result Value Ref Range   WBC 4.2 4.0 - 10.5 K/uL   RBC 2.84 (L) 3.87 - 5.11 MIL/uL   Hemoglobin 7.6 (L) 12.0 - 15.0 g/dL   HCT 26.2 (L) 36.0 - 46.0 %   MCV 92.3 80.0 - 100.0 fL   MCH 26.8 26.0 - 34.0 pg   MCHC 29.0 (L) 30.0 - 36.0 g/dL   RDW 18.0 (H) 11.5 - 15.5 %   Platelets 123 (L) 150 - 400 K/uL   nRBC 0.0 0.0 - 0.2 %    Comment: Performed at [facility], [address], [place], [place] [address]  Lipid panel  Status: Abnormal   Collection Time: 04/09/21  5:58 AM  Result Value Ref Range   Cholesterol 99 0 - 200 mg/dL   Triglycerides 55 <150 mg/dL   HDL 39 (L) >40 mg/dL   Total CHOL/HDL Ratio 2.5 RATIO   VLDL 11 0 - 40 mg/dL   LDL Cholesterol 49 0 - 99 mg/dL    Comment:        Total Cholesterol/HDL:CHD Risk Coronary Heart Disease Risk Table                     Men   Women  1/2 Average Risk   3.4   3.3  Average Risk       5.0   4.4  2 X Average Risk   9.6   7.1  3 X Average Risk  23.4   11.0        Use the calculated Patient Ratio above and  the CHD Risk Table to determine the patient's CHD Risk.        ATP III CLASSIFICATION (LDL):  <100     mg/dL   Optimal  100-129  mg/dL   Near or Above                    Optimal  130-159  mg/dL   Borderline  160-189  mg/dL   High  >190     mg/dL   Very High Performed at [place] [place], [place] [address]   Glucose, capillary     Status: None   Collection Time: 04/09/21  9:08 AM  Result Value Ref Range   Glucose-Capillary 75 70 - 99 mg/dL    Comment: Glucose reference range applies only to samples taken after fasting for at least 8 hours.  Glucose, capillary     Status: None   Collection Time: 04/09/21 11:35 AM  Result Value Ref Range   Glucose-Capillary 83 70 - 99 mg/dL    Comment: Glucose reference range applies only to samples taken after fasting for at least 8 hours.  Body fluid cell count with differential     Status: Abnormal   Collection Time: 04/09/21  1:45 PM  Result Value Ref Range   Fluid Type-FCT NONE    Color, Fluid YELLOW YELLOW   Appearance, Fluid CLEAR CLEAR   Total Nucleated Cell Count, Fluid 230 0 - 1,000 cu mm   Neutrophil Count, Fluid 2 0 - 25 %   Lymphs, Fluid 83 %   Monocyte-Macrophage-Serous Fluid 15 (L) 50 - 90 %    Comment: Performed at [facility], [address] [place], [place] [address]  Glucose, pleural or peritoneal fluid     Status: None   Collection Time: 04/09/21  1:45 PM  Result Value Ref Range   Glucose, Fluid 111 mg/dL    Comment: (NOTE) No normal range established for this test Results should be evaluated in conjunction with serum values    Fluid Type-FGLU Pleural R     Comment: Performed at [facility], [address], [place], [place] [address]  Protein, pleural or peritoneal fluid     Status: None   Collection Time: 04/09/21  1:45 PM  Result Value Ref Range   Total protein, fluid <3.0 g/dL   Fluid Type-FTP PLEURAL     Comment: RIGHT Performed at [facility], [address], [place], [place] [address]    Gram stain     Status: None   Collection Time: 04/09/21  1:45 PM   Specimen: Pleura  Result Value Ref Range   Specimen Description PLEURAL RIGHT    Special Requests NONE    Gram Stain      NO ORGANISMS SEEN WBC SEEN CYTOSPIN SMEAR Performed at [facility], [address], [place], [place] [address]    Report Status 04/09/2021 FINAL   Glucose, capillary     Status: None   Collection Time: 04/09/21  4:14 PM  Result Value Ref Range   Glucose-Capillary 96 70 - 99 mg/dL    Comment: Glucose reference range applies only to samples taken after fasting for at least 8 hours.    Studies/Results:   BRAIN MRI MRA NECK MRA IMPRESSION: 1. Markedly motion degraded study with essentially nondiagnostic MRA of the head and neck. 2. Early subacute infarct in the right ACA distribution without definite evidence of hemorrhagic transformation. 3. The intracranial vasculature is markedly suboptimally evaluated. The proximal vessels appear grossly patent. The distal vasculature is not evaluated. 4. Time-of-flight MRA demonstrates antegrade flow in the carotid systems and vertebral arteries. Stenoses or dissection can not be evaluated. 5. 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 6. Consider CTA of the head/neck for evaluation of the vasculature as indicated given reduced susceptibility to motion artifact.     TTE 1. Left ventricular ejection fraction, by estimation, is 60 to 65%. The  left ventricle has normal function. The left ventricle has no regional  wall motion abnormalities. There is severe concentric left ventricular  hypertrophy. Left ventricular diastolic   parameters are consistent with Grade II diastolic dysfunction  (pseudonormalization). There is the interventricular septum is flattened  in systole and diastole, consistent with right ventricular pressure and  volume overload.   2. Right ventricular systolic function is mildly reduced.  The right  ventricular size is mildly enlarged. Mildly increased right ventricular  wall thickness. There is severely elevated pulmonary artery systolic  pressure. The estimated right ventricular  systolic pressure is [id number] mmHg.   3. Left atrial size was moderately dilated.   4. The mitral valve is grossly normal. Mild mitral valve regurgitation.   5. Tricuspid valve regurgitation is moderate.   6. The aortic valve is tricuspid. Aortic valve regurgitation is not  visualized. Mild aortic valve sclerosis is present, with no evidence of  aortic valve stenosis. Aortic valve mean gradient measures 5.0 mmHg.   7. The inferior vena cava is dilated in size with <50% respiratory  variability, suggesting right atrial pressure of 15 mmHg.       The brain MRI is reviewed and shows acute increased signal on DWI involving the medial frontal region approximating the MCA distribution. There is chronic deep white ischemic changes. Remote infarcts noted in the cerebellum bilaterally especially inferior cerebellar region more on the left side.    Orestes Geiman A. Merlene Laughter, M.D.  Diplomate, Tax adviser of Psychiatry and Neurology ( Neurology). 04/09/2021, 5:45 PM

## 2021-04-10 DIAGNOSIS — N179 Acute kidney failure, unspecified: Secondary | ICD-10-CM | POA: Diagnosis not present

## 2021-04-10 DIAGNOSIS — J9601 Acute respiratory failure with hypoxia: Secondary | ICD-10-CM | POA: Diagnosis not present

## 2021-04-10 DIAGNOSIS — D6869 Other thrombophilia: Secondary | ICD-10-CM | POA: Diagnosis not present

## 2021-04-10 DIAGNOSIS — I5031 Acute diastolic (congestive) heart failure: Secondary | ICD-10-CM | POA: Diagnosis not present

## 2021-04-10 LAB — RENAL FUNCTION PANEL
Albumin: 2.3 g/dL — ABNORMAL LOW (ref 3.5–5.0)
Anion gap: 6 (ref 5–15)
BUN: 52 mg/dL — ABNORMAL HIGH (ref 8–23)
CO2: 21 mmol/L — ABNORMAL LOW (ref 22–32)
Calcium: 7.8 mg/dL — ABNORMAL LOW (ref 8.9–10.3)
Chloride: 110 mmol/L (ref 98–111)
Creatinine, Ser: 6.88 mg/dL — ABNORMAL HIGH (ref 0.44–1.00)
GFR, Estimated: 6 mL/min — ABNORMAL LOW (ref >60)
Glucose, Bld: 127 mg/dL — ABNORMAL HIGH (ref 70–99)
Phosphorus: 6.5 mg/dL — ABNORMAL HIGH (ref 2.5–4.6)
Potassium: 4.1 mmol/L (ref 3.5–5.1)
Sodium: 137 mmol/L (ref 135–145)

## 2021-04-10 LAB — CBC
HCT: 28.7 % — ABNORMAL LOW (ref 36.0–46.0)
Hemoglobin: 8 g/dL — ABNORMAL LOW (ref 12.0–15.0)
MCH: 26.2 pg (ref 26.0–34.0)
MCHC: 27.9 g/dL — ABNORMAL LOW (ref 30.0–36.0)
MCV: 94.1 fL (ref 80.0–100.0)
Platelets: 137 10*3/uL — ABNORMAL LOW (ref 150–400)
RBC: 3.05 MIL/uL — ABNORMAL LOW (ref 3.87–5.11)
RDW: 17.7 % — ABNORMAL HIGH (ref 11.5–15.5)
WBC: 4.9 10*3/uL (ref 4.0–10.5)
nRBC: 0 % (ref 0.0–0.2)

## 2021-04-10 LAB — GLUCOSE, CAPILLARY
Glucose-Capillary: 60 mg/dL — ABNORMAL LOW (ref 70–99)
Glucose-Capillary: 107 mg/dL — ABNORMAL HIGH (ref 70–99)
Glucose-Capillary: 97 mg/dL (ref 70–99)
Glucose-Capillary: 120 mg/dL — ABNORMAL HIGH (ref 70–99)
Glucose-Capillary: 163 mg/dL — ABNORMAL HIGH (ref 70–99)
Glucose-Capillary: 92 mg/dL (ref 70–99)

## 2021-04-10 LAB — MAGNESIUM: Magnesium: 1.8 mg/dL (ref 1.7–2.4)

## 2021-04-10 LAB — AMYLASE, BODY FLUID (OTHER): Amylase, Body Fluid: 35 U/L

## 2021-04-10 LAB — BRAIN NATRIURETIC PEPTIDE: B Natriuretic Peptide: 1178 pg/mL — ABNORMAL HIGH (ref 0.0–100.0)

## 2021-04-10 MED ORDER — LIVING BETTER WITH HEART FAILURE BOOK: Freq: Once | Status: AC

## 2021-04-10 MED ORDER — FUROSEMIDE 10 MG/ML IJ SOLN
80.0000 mg | Freq: Two times a day (BID) | INTRAMUSCULAR | Status: DC
  Administered 2021-04-10 – 2021-04-11 (×2): 80 mg via INTRAVENOUS
  Filled 2021-04-10 (×4): qty 8

## 2021-04-10 NOTE — Evaluation (Signed)
Physical Therapy Evaluation Patient Details Name: Tiffany Valencia MRN: [id number] DOB: 01-29-1946 Today's Date: 04/10/2021  History of Present Illness  Tiffany Valencia is a 75 year old female who presents with BLE edema, SOB, fall and confused. 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 10/3: Successful US guided R thoracentesis yielding 1 L of pleural fluid.  PMH: CKD, CAD, diabetes, HTN   Clinical Impression  Pt admitted with above diagnosis. Pt's friend states pt was using RW/rollator for ambulation, independent with dressing and toileting, receiving assist with sponge baths. Pt currently requires total A with bed mobility due to LUE and LLE flaccid, unable to assist with bed mobility. Pt oriented to self, date and location, but easily distracted, requires redirection and encouragement to mobilize with therapist with guarded carryover. Recommending SNF at this time due to heavy assist, no family present and significantly below baseline. Pt currently with functional limitations due to the deficits listed below (see PT Problem List). Pt will benefit from skilled PT to increase their independence and safety with mobility to allow discharge to the venue listed below.          Recommendations for follow up therapy are one component of a multi-disciplinary discharge planning process, led by the attending physician.  Recommendations may be updated based on patient status, additional functional criteria and insurance authorization.  Follow Up Recommendations SNF    Equipment Recommendations  None recommended by PT    Recommendations for Other Services       Precautions / Restrictions Precautions Precautions: Fall Precaution Comments: L hemiplegia Restrictions Weight Bearing Restrictions: No      Mobility  Bed Mobility Overal bed mobility: Needs Assistance Bed Mobility: Rolling Rolling: Total assist  General bed mobility  comments: total A to roll R/L in bed, therapist assisted pt in positioning RLE in hooklying position and guiding R hand to bedrail, max A to roll into semi L sidelying; total A to roll into R sidelying due to L side flaccid    Transfers     Ambulation/Gait     Stairs            Wheelchair Mobility    Modified Rankin (Stroke Patients Only)       Balance       Pertinent Vitals/Pain Pain Assessment: Faces Faces Pain Scale: Hurts even more Pain Location: generalized with mobility Pain Descriptors / Indicators: Grimacing Pain Intervention(s): Limited activity within patient's tolerance;Monitored during session;Premedicated before session;Repositioned    Home Living Family/patient expects to be discharged to:: Skilled nursing facility     Prior Function Level of Independence: Independent with assistive device(s)  Comments: Per pt's friend, pt was ambulating with RW or rollator in the home, able to navigate 4 STE home, toileting and dressing independently, friend assisting pt with sponge baths.     Hand Dominance        Extremity/Trunk Assessment   Upper Extremity Assessment Upper Extremity Assessment: Defer to OT evaluation;LUE deficits/detail LUE Deficits / Details: 0/5 at shoulder, elbow and wrist, unable to grip therapist's hand or mobilize with bed mobility despite cues    Lower Extremity Assessment Lower Extremity Assessment: LLE deficits/detail LLE Deficits / Details: 0/5 throughout LLE, no trace mobility at toe, ankle, knee or hip; reports "too painful to move" but no wincing noted with PROM       Communication   Communication: Expressive difficulties (mumbled speech at times)  Cognition Arousal/Alertness: Lethargic Behavior During Therapy: Flat affect  Overall Cognitive Status: Difficult to assess  General Comments: Pt flat throughout eval, able to state name, location and date appropriately. Pt easily distractable requiring redirection to task,  difficult to motivate with mobility.      General Comments      Exercises     Assessment/Plan    PT Assessment Patient needs continued PT services  PT Problem List Decreased strength;Decreased range of motion;Decreased activity tolerance;Decreased balance;Decreased mobility;Decreased coordination;Decreased cognition;Decreased knowledge of use of DME;Impaired tone;Obesity       PT Treatment Interventions DME instruction;Gait training;Functional mobility training;Therapeutic activities;Therapeutic exercise;Balance training;Neuromuscular re-education;Cognitive remediation;Patient/family education;Wheelchair mobility training    PT Goals (Current goals can be found in the Care Plan section)  Acute Rehab PT Goals Patient Stated Goal: return home PT Goal Formulation: With patient/family Time For Goal Achievement: 04/24/21 Potential to Achieve Goals: Fair    Frequency Min 3X/week   Barriers to discharge        Co-evaluation               AM-PAC PT "6 Clicks" Mobility  Outcome Measure Help needed turning from your back to your side while in a flat bed without using bedrails?: Total Help needed moving from lying on your back to sitting on the side of a flat bed without using bedrails?: Total Help needed moving to and from a bed to a chair (including a wheelchair)?: Total Help needed standing up from a chair using your arms (e.g., wheelchair or bedside chair)?: Total Help needed to walk in hospital room?: Total Help needed climbing 3-5 steps with a railing? : Total 6 Click Score: 6    End of Session   Activity Tolerance: Patient limited by lethargy;Patient limited by pain Patient left: in bed;with call bell/phone within reach;with bed alarm set;with family/visitor present Nurse Communication: Mobility status;Other (comment) (Friend wrote pt's daughter's phone number down and wanted RN to be aware) PT Visit Diagnosis: Other abnormalities of gait and mobility (R26.89);Muscle  weakness (generalized) (M62.81);Other symptoms and signs involving the nervous system (R29.898);Hemiplegia and hemiparesis Hemiplegia - Right/Left: Left    Time: [id number] PT Time Calculation (min) (ACUTE ONLY): 28 min   Charges:   PT Evaluation $PT Eval Moderate Complexity: 1 Mod PT Treatments $Therapeutic Activity: 8-22 mins         Tori Emiko Osorto PT, DPT 04/10/21, 1:05 PM

## 2021-04-10 NOTE — Progress Notes (Addendum)
PROGRESS NOTE    Tiffany Valencia  [id number] DOB: 1945/09/27 DOA: 04/08/2021 PCP: Pcp, No    Brief Narrative:  Tiffany Valencia is a 75 year old female recently brought to the area with family. Family report the patient was unable to care for herself in [place], brought her to [facility] for the past month. Reported hx includes:cad with stent 2013, htn, hld, ckd, afib, dm, and obesity, and CVA. No medical records were available from [place]/her previous providers. On 10/2 she was noted to be slow to respond, increased edema, was brought to the ED. Noted hypertension and edema, pleural effusion. 10/3 the patient was noted to be altered upon morning rounds, code stroke was called, confirmed by ct and mri. Left extremity weakness remains, permissive hypertension remains, soon to restart her antihypertensives and apixaban.   Assessment & Plan:   ACUTE Cerebrovascular accident (CVA) due to embolism of right anterior cerebral artery  -code stroke with head ct at 0920 04/09/21. -conducted full stroke work up  -MR brain with MR head and neck angiography -not a candidate for thrombolytics per neurology given patient was on apixaban and last known normal not clear  -Inpatient neurology consultation requested -Neurology recommended RESTARTING apixaban after 3 Days - see notes     Acute diastolic heart failure -Continue IV Lasix 40 mg BID for diuresis -Monitor intake and output Daily weights -Monitor electrolytes closely -Trend cardiac BNP -Follow portable chest x-ray    Acute respiratory failure with hypoxia -Secondary to heart failure exacerbation volume overload -Secondary to large right pleural effusion -Treating supportively with diuresis - ultrasound thoracentesis done 10/3 - follow fluid studies      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    Benign hypertension  -amlodipine, clonidine, permissive hypertension x 48 hours in setting of acute CVA -reducing metoprolol and imdur with holding parameters -hydralazine iv for sbp>170     CKD (chronic kidney disease) stage IV -daily renal function labs -electrolyte correction as needed -appreciate nephrology consultation and recommendations -no indication for dialysis at this time -three times daily sodium bicarb tablet     Anemia in chronic kidney disease (CKD) -iron supplementation IV x 3 doses per nephrology team   Large Pleural effusion on right -ultrasound thoracentesis performed 04/09/21     Elevated brain natriuretic peptide (BNP) level -monitoring with serial bnp's     Coronary artery disease s/p STENT -continue home statin, imdur     Paroxysmal Atrial fibrillation/Acquired thrombophilia / Chronic anticoagulation -ASA per neurology for now in setting of acute CVA  -holding apixaban for now  per neurology  DVT prophylaxis: heparin but plan to restart apixaban after 3 days post CVA Code Status: full Family Communication: t/c to daughter w/ pt permission Disposition Plan: anticipating SNF placement    Consultants:  Neurology Nephrology Palliative care   Procedures:  -Ct head, code stroke -MR Angio head and neck wo contrast -MR brain wo contrast -US thoracentesis -US renal  Subjective: She is alert and oriented to person, place, time, and event.  She reported that her entire body hurt.    Objective: Vitals:   04/10/21 0414 04/10/21 0747 04/10/21 1136 04/10/21 1413  BP:  (!) 158/70 (!) 165/68 (!) 166/68  Pulse:  (!) 54 60 61  Resp:  '16 16 15  '$ Temp:  (!) 97.5 F (36.4 C) 97.7 F (36.5 C) (!) 97.5 F (36.4  C)  TempSrc:  Oral Oral Oral  SpO2:  100% 94% 92%  Weight: 82.7 kg     Height:        Intake/Output Summary (Last 24 hours) at 04/10/2021 1536 Last data filed at 04/10/2021 1407 Gross per 24 hour  Intake 600 ml  Output 1650 ml   Net -1050 ml   Filed Weights   04/08/21 1017 04/09/21 0526 04/10/21 0414  Weight: 86.2 kg 87.4 kg 82.7 kg    Examination:  General exam: elderly chronically ill appearing female with a significant dysarthria. Appears calm and comfortable, supine in bed  Respiratory system: Clear to auscultation. Respiratory effort normal.  Cardiovascular system: normal S1 & S2 heard, IRRR. No JVD, murmurs, rubs, gallops or clicks. 1 pedal edema  Gastrointestinal system: Abdomen is nondistended, soft and nontender. No organomegaly or masses felt. Normal bowel sounds heard.  Central nervous system: Alert and oriented. Left extremity weakness remains  Extremities: right extremities are able to follow commands, left are significantly weak (dense left hemi-paresis)  Skin: No obvious rashes, lesions or ulcers  Psychiatry: Judgement and insight appear very poor. Mood & affect difficult to determine.    Data Reviewed: I have personally reviewed following labs and imaging studies  CBC: Recent Labs  Lab 04/08/21 1135 04/09/21 0558 04/10/21 0659  WBC 5.5 4.2 4.9  NEUTROABS 3.6  --   --   HGB 8.3* 7.6* 8.0*  HCT 28.8* 26.2* 28.7*  MCV 92.6 92.3 94.1  PLT 153 123* 0000000*   Basic Metabolic Panel: Recent Labs  Lab 04/08/21 1135 04/09/21 0558 04/10/21 0659  NA 136 139 137  K 3.7 4.0 4.1  CL 111 113* 110  CO2 18* 21* 21*  GLUCOSE 192* 104* 127*  BUN 50* 51* 52*  CREATININE 6.41* 6.72* 6.88*  CALCIUM 7.6* 7.8* 7.8*  MG  --  1.8 1.8  PHOS  --  5.4* 6.5*   GFR: Estimated Creatinine Clearance: 7.2 mL/min (A) (by C-G formula based on SCr of 6.88 mg/dL (H)). Liver Function Tests: Recent Labs  Lab 04/08/21 1135 04/09/21 0558 04/10/21 0659  AST 24  --   --   ALT 27  --   --   ALKPHOS 83  --   --   BILITOT 0.6  --   --   PROT 7.8  --   --   ALBUMIN 2.7* 2.3* 2.3*   No results for input(s): LIPASE, AMYLASE in the last 168 hours. No results for input(s): AMMONIA in the last 168  hours. Coagulation Profile: No results for input(s): INR, PROTIME in the last 168 hours. Cardiac Enzymes: No results for input(s): CKTOTAL, CKMB, CKMBINDEX, TROPONINI in the last 168 hours. BNP (last 3 results) No results for input(s): PROBNP in the last 8760 hours. HbA1C: Recent Labs    04/08/21 1444  HGBA1C 5.9*   CBG: Recent Labs  Lab 04/09/21 2133 04/10/21 0311 04/10/21 0352 04/10/21 0747 04/10/21 1107  GLUCAP 105* 60* 107* 97 120*   Lipid Profile: Recent Labs    04/09/21 0558  CHOL 99  HDL 39*  LDLCALC 49  TRIG 55  CHOLHDL 2.5   Thyroid Function Tests: Recent Labs    04/08/21 1135 04/08/21 1444  TSH 9.838*  --   FREET4  --  1.02   Anemia Panel: Recent Labs    04/08/21 1135 04/08/21 1444  VITAMINB12  --  939*  FOLATE  --  10.7  FERRITIN  --  198  TIBC  --  180*  IRON  --  24*  RETICCTPCT 1.6  --    Urine analysis:    Component Value Date/Time   COLORURINE YELLOW 04/08/2021 1714   APPEARANCEUR CLEAR 04/08/2021 1714   LABSPEC 1.014 04/08/2021 1714   PHURINE 6.0 04/08/2021 1714   GLUCOSEU 150 (A) 04/08/2021 1714   HGBUR SMALL (A) 04/08/2021 1714   BILIRUBINUR NEGATIVE 04/08/2021 1714   KETONESUR NEGATIVE 04/08/2021 1714   PROTEINUR >=300 (A) 04/08/2021 1714   NITRITE NEGATIVE 04/08/2021 1714   LEUKOCYTESUR NEGATIVE 04/08/2021 1714    Recent Results (from the past 240 hour(s))  Resp Panel by RT-PCR (Flu A&B, Covid) Nasopharyngeal Swab     Status: None   Collection Time: 04/08/21 10:40 AM   Specimen: Nasopharyngeal Swab; Nasopharyngeal(NP) swabs in vial transport medium  Result Value Ref Range Status   SARS Coronavirus 2 by RT PCR NEGATIVE NEGATIVE Final    Comment: (NOTE) SARS-CoV-2 target nucleic acids are NOT DETECTED.  The SARS-CoV-2 RNA is generally detectable in upper respiratory specimens during the acute phase of infection. The lowest concentration of SARS-CoV-2 viral copies this assay can detect is 138 copies/mL. A negative  result does not preclude SARS-Cov-2 infection and should not be used as the sole basis for treatment or other patient management decisions. A negative result may occur with  improper specimen collection/handling, submission of specimen other than nasopharyngeal swab, presence of viral mutation(s) within the areas targeted by this assay, and inadequate number of viral copies(<138 copies/mL). A negative result must be combined with clinical observations, patient history, and epidemiological information. The expected result is Negative.  Fact Sheet for Patients:  EntrepreneurPulse.com.au  Fact Sheet for Healthcare Providers:  IncredibleEmployment.be  This test is no t yet approved or cleared by the [place] FDA and  has been authorized for detection and/or diagnosis of SARS-CoV-2 by FDA under an Emergency Use Authorization (EUA). This EUA will remain  in effect (meaning this test can be used) for the duration of the COVID-19 declaration under Section 564(b)(1) of the Act, 21 U.S.C.section 360bbb-3(b)(1), unless the authorization is terminated  or revoked sooner.       Influenza A by PCR NEGATIVE NEGATIVE Final   Influenza B by PCR NEGATIVE NEGATIVE Final    Comment: (NOTE) The Xpert Xpress SARS-CoV-2/FLU/RSV plus assay is intended as an aid in the diagnosis of influenza from Nasopharyngeal swab specimens and should not be used as a sole basis for treatment. Nasal washings and aspirates are unacceptable for Xpert Xpress SARS-CoV-2/FLU/RSV testing.  Fact Sheet for Patients: EntrepreneurPulse.com.au  Fact Sheet for Healthcare Providers: IncredibleEmployment.be  This test is not yet approved or cleared by the [place] FDA and has been authorized for detection and/or diagnosis of SARS-CoV-2 by FDA under an Emergency Use Authorization (EUA). This EUA will remain in effect (meaning this test can be used)  for the duration of the COVID-19 declaration under Section 564(b)(1) of the Act, 21 U.S.C. section 360bbb-3(b)(1), unless the authorization is terminated or revoked.  Performed at [facility], [address], [place], [place] [address]   Culture, body fluid w Gram Stain-bottle     Status: None (Preliminary result)   Collection Time: 04/09/21  1:45 PM   Specimen: Pleura  Result Value Ref Range Status   Specimen Description PLEURAL RIGHT  Final   Special Requests NONE  Final   Culture   Final    NO GROWTH < 24 HOURS Performed at [facility], [address], [place], [place] [address]  Report Status PENDING  Incomplete  Gram stain     Status: None   Collection Time: 04/09/21  1:45 PM   Specimen: Pleura  Result Value Ref Range Status   Specimen Description PLEURAL RIGHT  Final   Special Requests NONE  Final   Gram Stain   Final    NO ORGANISMS SEEN WBC SEEN CYTOSPIN SMEAR Performed at [facility], [address], [place], [place] [address]    Report Status 04/09/2021 FINAL  Final     Radiology Studies: DG Chest 1 View  Result Date: 04/09/2021 CLINICAL DATA:  RIGHT pleural effusion post thoracentesis EXAM: CHEST  1 VIEW COMPARISON:  Exam at 1341 hours compared to 0451 hours FINDINGS: Resolution of RIGHT pleural effusion. Minimal residual bibasilar atelectasis. No pneumothorax. Enlargement of cardiac silhouette persists. Atherosclerotic calcification aorta. IMPRESSION: No pneumothorax following RIGHT thoracentesis. Minimal bibasilar atelectasis. Electronically Signed   By: Lavonia Dana M.D.   On: 04/09/2021 14:37   MR ANGIO HEAD WO CONTRAST  Result Date: 04/09/2021 CLINICAL DATA:  Altered mental status, does not communicate EXAM: MRI HEAD WITHOUT CONTRAST MRA HEAD WITHOUT CONTRAST MRA NECK WITHOUT CONTRAST TECHNIQUE: Multiplanar, multiecho pulse sequences of the brain and surrounding structures were obtained without intravenous contrast. Angiographic images of the Circle of Willis  were obtained using MRA technique without intravenous contrast. Angiographic images of the neck were obtained using MRA technique without intravenous contrast. Carotid stenosis measurements (when applicable) are obtained utilizing NASCET criteria, using the distal internal carotid diameter as the denominator. COMPARISON:  Same-day noncontrast CT head FINDINGS: Image quality is significantly degraded by motion artifact. MRI HEAD FINDINGS Brain: There is diffusion restriction in the right ACA distribution involving the right aspect of the genu of the corpus callosum and right parasagittal frontal lobe. The FLAIR sequences markedly motion degraded, but there appears to be associated FLAIR signal abnormality. Findings consistent with evolving early subacute infarct. Susceptibility artifact along the falx on the T2* sequence is likely due to bulky dural calcifications seen on the same-day head CT. There is no convincing evidence of hemorrhagic transformation. There is a large remote infarct in the left cerebellar hemisphere and a small remote infarct in the right cerebellar hemisphere. There is a background of mild-to-moderate global parenchymal volume loss with enlargement of the ventricular system and extra-axial CSF spaces. Additional foci of T2/FLAIR signal abnormality in the subcortical and periventricular white matter likely reflects sequela of chronic white matter microangiopathy. Vascular: The major intracranial flow voids are present. Skull and upper cervical spine: Normal marrow signal. Sinuses/Orbits: The imaged paranasal sinuses are clear. Bilateral lens implants are in place. The globes and orbits are otherwise unremarkable. Other: There is a left mastoid effusion. MRA HEAD FINDINGS Evaluation of the intracranial vasculature is markedly degraded by motion artifact. Anterior circulation: The right intracranial ICA appears patent. Evaluation of the left intracranial ICA is markedly degraded, but appears to be  patent. The bilateral M1 segments are patent. The distal branches appear overall patent, but are not well evaluated. The proximal anterior cerebral arteries are patent. The distal anterior cerebral arteries are not well evaluated. Anterior circulation aneurysm can not be excluded. Posterior circulation: The V4 segments of the vertebral arteries are patent. The basilar artery is patent. The proximal PCAs are patent. The distal branches are not well evaluated. Posterior circulation aneurysms can not be excluded. Anatomic variants: The posterior communicating arteries are not definitively identified. MRA NECK FINDINGS Evaluation of the vasculature of the neck is markedly degraded by motion artifact.  Aortic arch: Not evaluated. Right carotid System: Time-of-flight MRA demonstrates antegrade flow in the right carotid system. Stenosis or dissection can not be evaluated. Left carotid System: Time-of-flight MRA demonstrates antegrade flow in the left carotid system. Stenosis or dissection can not be evaluated. Vertebral arteries: Time-of-flight MRA demonstrates antegrade flow in the vertebral arteries. Stenosis or dissection can not be evaluated. IMPRESSION: 1. Markedly motion degraded study with essentially nondiagnostic MRA of the head and neck. 2. Early subacute infarct in the right ACA distribution without definite evidence of hemorrhagic transformation. 3. The intracranial vasculature is markedly suboptimally evaluated. The proximal vessels appear grossly patent. The distal vasculature is not evaluated. 4. Time-of-flight MRA demonstrates antegrade flow in the carotid systems and vertebral arteries. Stenoses or dissection can not be evaluated. 5. 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 6. Consider CTA of the head/neck for evaluation of the vasculature as indicated given reduced susceptibility to motion artifact. Electronically Signed   By: Valetta Mole M.D.   On: 04/09/2021 11:08    MR ANGIO NECK WO CONTRAST  Result Date: 04/09/2021 CLINICAL DATA:  Altered mental status, does not communicate EXAM: MRI HEAD WITHOUT CONTRAST MRA HEAD WITHOUT CONTRAST MRA NECK WITHOUT CONTRAST TECHNIQUE: Multiplanar, multiecho pulse sequences of the brain and surrounding structures were obtained without intravenous contrast. Angiographic images of the Circle of Willis were obtained using MRA technique without intravenous contrast. Angiographic images of the neck were obtained using MRA technique without intravenous contrast. Carotid stenosis measurements (when applicable) are obtained utilizing NASCET criteria, using the distal internal carotid diameter as the denominator. COMPARISON:  Same-day noncontrast CT head FINDINGS: Image quality is significantly degraded by motion artifact. MRI HEAD FINDINGS Brain: There is diffusion restriction in the right ACA distribution involving the right aspect of the genu of the corpus callosum and right parasagittal frontal lobe. The FLAIR sequences markedly motion degraded, but there appears to be associated FLAIR signal abnormality. Findings consistent with evolving early subacute infarct. Susceptibility artifact along the falx on the T2* sequence is likely due to bulky dural calcifications seen on the same-day head CT. There is no convincing evidence of hemorrhagic transformation. There is a large remote infarct in the left cerebellar hemisphere and a small remote infarct in the right cerebellar hemisphere. There is a background of mild-to-moderate global parenchymal volume loss with enlargement of the ventricular system and extra-axial CSF spaces. Additional foci of T2/FLAIR signal abnormality in the subcortical and periventricular white matter likely reflects sequela of chronic white matter microangiopathy. Vascular: The major intracranial flow voids are present. Skull and upper cervical spine: Normal marrow signal. Sinuses/Orbits: The imaged paranasal sinuses are  clear. Bilateral lens implants are in place. The globes and orbits are otherwise unremarkable. Other: There is a left mastoid effusion. MRA HEAD FINDINGS Evaluation of the intracranial vasculature is markedly degraded by motion artifact. Anterior circulation: The right intracranial ICA appears patent. Evaluation of the left intracranial ICA is markedly degraded, but appears to be patent. The bilateral M1 segments are patent. The distal branches appear overall patent, but are not well evaluated. The proximal anterior cerebral arteries are patent. The distal anterior cerebral arteries are not well evaluated. Anterior circulation aneurysm can not be excluded. Posterior circulation: The V4 segments of the vertebral arteries are patent. The basilar artery is patent. The proximal PCAs are patent. The distal branches are not well evaluated. Posterior circulation aneurysms can not be excluded. Anatomic variants: The posterior communicating arteries are not definitively identified. MRA NECK FINDINGS  Evaluation of the vasculature of the neck is markedly degraded by motion artifact. Aortic arch: Not evaluated. Right carotid System: Time-of-flight MRA demonstrates antegrade flow in the right carotid system. Stenosis or dissection can not be evaluated. Left carotid System: Time-of-flight MRA demonstrates antegrade flow in the left carotid system. Stenosis or dissection can not be evaluated. Vertebral arteries: Time-of-flight MRA demonstrates antegrade flow in the vertebral arteries. Stenosis or dissection can not be evaluated. IMPRESSION: 1. Markedly motion degraded study with essentially nondiagnostic MRA of the head and neck. 2. Early subacute infarct in the right ACA distribution without definite evidence of hemorrhagic transformation. 3. The intracranial vasculature is markedly suboptimally evaluated. The proximal vessels appear grossly patent. The distal vasculature is not evaluated. 4. Time-of-flight MRA demonstrates  antegrade flow in the carotid systems and vertebral arteries. Stenoses or dissection can not be evaluated. 5. 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 6. Consider CTA of the head/neck for evaluation of the vasculature as indicated given reduced susceptibility to motion artifact. Electronically Signed   By: Valetta Mole M.D.   On: 04/09/2021 11:08   MR BRAIN WO CONTRAST  Result Date: 04/09/2021 CLINICAL DATA:  Altered mental status, does not communicate EXAM: MRI HEAD WITHOUT CONTRAST MRA HEAD WITHOUT CONTRAST MRA NECK WITHOUT CONTRAST TECHNIQUE: Multiplanar, multiecho pulse sequences of the brain and surrounding structures were obtained without intravenous contrast. Angiographic images of the Circle of Willis were obtained using MRA technique without intravenous contrast. Angiographic images of the neck were obtained using MRA technique without intravenous contrast. Carotid stenosis measurements (when applicable) are obtained utilizing NASCET criteria, using the distal internal carotid diameter as the denominator. COMPARISON:  Same-day noncontrast CT head FINDINGS: Image quality is significantly degraded by motion artifact. MRI HEAD FINDINGS Brain: There is diffusion restriction in the right ACA distribution involving the right aspect of the genu of the corpus callosum and right parasagittal frontal lobe. The FLAIR sequences markedly motion degraded, but there appears to be associated FLAIR signal abnormality. Findings consistent with evolving early subacute infarct. Susceptibility artifact along the falx on the T2* sequence is likely due to bulky dural calcifications seen on the same-day head CT. There is no convincing evidence of hemorrhagic transformation. There is a large remote infarct in the left cerebellar hemisphere and a small remote infarct in the right cerebellar hemisphere. There is a background of mild-to-moderate global parenchymal volume loss with enlargement  of the ventricular system and extra-axial CSF spaces. Additional foci of T2/FLAIR signal abnormality in the subcortical and periventricular white matter likely reflects sequela of chronic white matter microangiopathy. Vascular: The major intracranial flow voids are present. Skull and upper cervical spine: Normal marrow signal. Sinuses/Orbits: The imaged paranasal sinuses are clear. Bilateral lens implants are in place. The globes and orbits are otherwise unremarkable. Other: There is a left mastoid effusion. MRA HEAD FINDINGS Evaluation of the intracranial vasculature is markedly degraded by motion artifact. Anterior circulation: The right intracranial ICA appears patent. Evaluation of the left intracranial ICA is markedly degraded, but appears to be patent. The bilateral M1 segments are patent. The distal branches appear overall patent, but are not well evaluated. The proximal anterior cerebral arteries are patent. The distal anterior cerebral arteries are not well evaluated. Anterior circulation aneurysm can not be excluded. Posterior circulation: The V4 segments of the vertebral arteries are patent. The basilar artery is patent. The proximal PCAs are patent. The distal branches are not well evaluated. Posterior circulation aneurysms can not be excluded. Anatomic  variants: The posterior communicating arteries are not definitively identified. MRA NECK FINDINGS Evaluation of the vasculature of the neck is markedly degraded by motion artifact. Aortic arch: Not evaluated. Right carotid System: Time-of-flight MRA demonstrates antegrade flow in the right carotid system. Stenosis or dissection can not be evaluated. Left carotid System: Time-of-flight MRA demonstrates antegrade flow in the left carotid system. Stenosis or dissection can not be evaluated. Vertebral arteries: Time-of-flight MRA demonstrates antegrade flow in the vertebral arteries. Stenosis or dissection can not be evaluated. IMPRESSION: 1. Markedly motion  degraded study with essentially nondiagnostic MRA of the head and neck. 2. Early subacute infarct in the right ACA distribution without definite evidence of hemorrhagic transformation. 3. The intracranial vasculature is markedly suboptimally evaluated. The proximal vessels appear grossly patent. The distal vasculature is not evaluated. 4. Time-of-flight MRA demonstrates antegrade flow in the carotid systems and vertebral arteries. Stenoses or dissection can not be evaluated. 5. 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 6. Consider CTA of the head/neck for evaluation of the vasculature as indicated given reduced susceptibility to motion artifact. Electronically Signed   By: Valetta Mole M.D.   On: 04/09/2021 11:08   US RENAL  Result Date: 04/09/2021 CLINICAL DATA:  Pulmonary edema, pleural effusion, diabetes mellitus, hypertension, altered mental EXAM: RENAL / URINARY TRACT ULTRASOUND COMPLETE COMPARISON:  None FINDINGS: Right Kidney: Renal measurements: 10.3 x 5.6 x 5.5 cm = volume: 164 mL. Normal cortical thickness. Significantly increased cortical echogenicity. No hydronephrosis or shadowing calcification. Multiple RIGHT renal cysts, largest 2.2 cm and 2.3 cm in greatest sizes. Left Kidney: No LEFT kidney visualized, question obscured, ectopic, or surgically versus developmentally absent. Bladder: Appears normal for degree of bladder distention. Other: N/A IMPRESSION: Nonvisualization of LEFT kidney. Medical renal disease changes RIGHT kidney with multiple renal cysts. Electronically Signed   By: Lavonia Dana M.D.   On: 04/09/2021 14:39   DG Chest Port 1 View  Result Date: 04/09/2021 CLINICAL DATA:  75 year old female with history of right pleural effusion and pulmonary edema. EXAM: PORTABLE CHEST 1 VIEW COMPARISON:  Chest x-ray 04/08/2021. FINDINGS: Opacity in the base of the right hemithorax compatible with atelectasis and/or consolidation, with superimposed moderate  to large right pleural effusion. Left lung appears clear. No left pleural effusion. Cephalization of the pulmonary vasculature. Heart size is mildly enlarged. Atherosclerotic calcifications in the thoracic aorta. IMPRESSION: 1. Persistent moderate to large right pleural effusion with atelectasis and/or consolidation in the right lung base. 2. Mild cardiomegaly. 3. Aortic atherosclerosis. 1. Electronically Signed   By: Vinnie Langton M.D.   On: 04/09/2021 05:22   ECHOCARDIOGRAM COMPLETE  Result Date: 04/09/2021    ECHOCARDIOGRAM REPORT   Patient Name:   LADIAMOND VEITH Date of Exam: 04/09/2021 Medical Rec #:  [id number]         Height:       63.5 in Accession #:    [id number]        Weight:       192.7 lb Date of Birth:  21-Jul-1945         BSA:          1.914 m Patient Age:    28 years          BP:           167/86 mmHg Patient Gender: F                 HR:  82 bpm. Exam Location:  [facility] Procedure: 2D Echo, Cardiac Doppler and Color Doppler Indications:    Congestive Heart Failure  History:        Patient has no prior history of Echocardiogram examinations.                 CHF, CAD, Arrythmias:Atrial Fibrillation; Risk Factors:Diabetes                 and Hypertension.  Sonographer:    Wenda Low Referring Phys: [place]  1. Left ventricular ejection fraction, by estimation, is 60 to 65%. The left ventricle has normal function. The left ventricle has no regional wall motion abnormalities. There is severe concentric left ventricular hypertrophy. Left ventricular diastolic  parameters are consistent with Grade II diastolic dysfunction (pseudonormalization). There is the interventricular septum is flattened in systole and diastole, consistent with right ventricular pressure and volume overload.  2. Right ventricular systolic function is mildly reduced. The right ventricular size is mildly enlarged. Mildly increased right ventricular wall thickness. There is severely  elevated pulmonary artery systolic pressure. The estimated right ventricular systolic pressure is [id number] mmHg.  3. Left atrial size was moderately dilated.  4. The mitral valve is grossly normal. Mild mitral valve regurgitation.  5. Tricuspid valve regurgitation is moderate.  6. The aortic valve is tricuspid. Aortic valve regurgitation is not visualized. Mild aortic valve sclerosis is present, with no evidence of aortic valve stenosis. Aortic valve mean gradient measures 5.0 mmHg.  7. The inferior vena cava is dilated in size with <50% respiratory variability, suggesting right atrial pressure of 15 mmHg. Comparison(s): No prior Echocardiogram. FINDINGS  Left Ventricle: Left ventricular ejection fraction, by estimation, is 60 to 65%. The left ventricle has normal function. The left ventricle has no regional wall motion abnormalities. The left ventricular internal cavity size was normal in size. There is  severe concentric left ventricular hypertrophy. The interventricular septum is flattened in systole and diastole, consistent with right ventricular pressure and volume overload. Left ventricular diastolic parameters are consistent with Grade II diastolic dysfunction (pseudonormalization). Right Ventricle: The right ventricular size is mildly enlarged. Mildly increased right ventricular wall thickness. Right ventricular systolic function is mildly reduced. There is severely elevated pulmonary artery systolic pressure. The tricuspid regurgitant velocity is 3.46 m/s, and with an assumed right atrial pressure of 15 mmHg, the estimated right ventricular systolic pressure is [id number] mmHg. Left Atrium: Left atrial size was moderately dilated. Right Atrium: Right atrial size was normal in size. Pericardium: There is no evidence of pericardial effusion. Mitral Valve: The mitral valve is grossly normal. Mild mitral valve regurgitation. MV peak gradient, 4.0 mmHg. The mean mitral valve gradient is 1.0 mmHg. Tricuspid Valve: The  tricuspid valve is grossly normal. Tricuspid valve regurgitation is moderate. Aortic Valve: The aortic valve is tricuspid. There is mild aortic valve annular calcification. Aortic valve regurgitation is not visualized. Mild aortic valve sclerosis is present, with no evidence of aortic valve stenosis. Aortic valve mean gradient measures 5.0 mmHg. Aortic valve peak gradient measures 9.1 mmHg. Aortic valve area, by VTI measures 1.69 cm. Pulmonic Valve: The pulmonic valve was grossly normal. Pulmonic valve regurgitation is trivial. Aorta: The aortic root is normal in size and structure. Venous: The inferior vena cava is dilated in size with less than 50% respiratory variability, suggesting right atrial pressure of 15 mmHg. IAS/Shunts: No atrial level shunt detected by color flow Doppler.  LEFT VENTRICLE PLAX 2D LVIDd:  4.30 cm  Diastology LVIDs:         2.90 cm  LV e' medial:    3.78 cm/s LV PW:         1.90 cm  LV E/e' medial:  22.3 LV IVS:        1.40 cm  LV e' lateral:   10.10 cm/s LVOT diam:     2.00 cm  LV E/e' lateral: 8.4 LV SV:         63 LV SV Index:   33 LVOT Area:     3.14 cm  RIGHT VENTRICLE RV Basal diam:  4.10 cm RV Mid diam:    3.20 cm LEFT ATRIUM              Index       RIGHT ATRIUM           Index LA diam:        4.30 cm  2.25 cm/m  RA Area:     18.40 cm LA Vol (A2C):   106.0 ml 55.37 ml/m RA Volume:   46.20 ml  24.13 ml/m LA Vol (A4C):   77.1 ml  40.27 ml/m LA Biplane Vol: 90.7 ml  47.38 ml/m  AORTIC VALVE                    PULMONIC VALVE AV Area (Vmax):    1.83 cm     PV Vmax:       0.77 m/s AV Area (Vmean):   1.80 cm     PV Peak grad:  2.4 mmHg AV Area (VTI):     1.69 cm AV Vmax:           151.00 cm/s AV Vmean:          102.000 cm/s AV VTI:            0.372 m AV Peak Grad:      9.1 mmHg AV Mean Grad:      5.0 mmHg LVOT Vmax:         88.10 cm/s LVOT Vmean:        58.500 cm/s LVOT VTI:          0.200 m LVOT/AV VTI ratio: 0.54  AORTA Ao Root diam: 3.00 cm MITRAL VALVE                TRICUSPID VALVE MV Area (PHT): 2.68 cm    TR Peak grad:   47.9 mmHg MV Area VTI:   1.79 cm    TR Vmax:        346.00 cm/s MV Peak grad:  4.0 mmHg MV Mean grad:  1.0 mmHg    SHUNTS MV Vmax:       1.00 m/s    Systemic VTI:  0.20 m MV Vmean:      51.8 cm/s   Systemic Diam: 2.00 cm MV Decel Time: 283 msec MV E velocity: 84.40 cm/s MV A velocity: 78.40 cm/s MV E/A ratio:  1.08 Rozann Lesches MD Electronically signed by Rozann Lesches MD Signature Date/Time: 04/09/2021/4:11:51 PM    Final    CT HEAD CODE STROKE WO CONTRAST  Result Date: 04/09/2021 CLINICAL DATA:  Code stroke.  Altered mental status EXAM: CT HEAD WITHOUT CONTRAST TECHNIQUE: Contiguous axial images were obtained from the base of the skull through the vertex without intravenous contrast. COMPARISON:  Head CT from yesterday FINDINGS: Brain: New cytotoxic edema in the parasagittal right frontal lobe, ACA distribution. Chronic small vessel ischemia  in the hemispheric white matter with chronic basal ganglia lacunar infarcts. Chronic left larger than right cerebellar infarcts. Encephalomalacia in the left frontal lobe at the anterior cranial fossa and anterior parasagittal region, likely also post ischemic. Vascular: No hyperdense vessel or unexpected calcification. Skull: Normal. Negative for fracture or focal lesion. Sinuses/Orbits: Bilateral cataract resection Other: These results were communicated to Dr. Wynetta Emery at 9:40 am on 04/09/2021 by text page via the [facility] messaging system. IMPRESSION: 1. Acute cortical infarct is newly seen in the parasagittal right frontal lobe, ACA branch distribution. 2. Chronic ischemic injury as described, including remote left ACA territory infarcts. Electronically Signed   By: Jorje Guild M.D.   On: 04/09/2021 09:42   US THORACENTESIS ASP PLEURAL SPACE W/IMG GUIDE  Result Date: 04/09/2021 INDICATION: RIGHT pleural effusion EXAM: ULTRASOUND GUIDED DIAGNOSTIC AND THERAPEUTIC RIGHT THORACENTESIS MEDICATIONS: None.  COMPLICATIONS: None immediate. PROCEDURE: An ultrasound guided thoracentesis was thoroughly discussed with the patient and questions answered. The benefits, risks, alternatives and complications were also discussed. The patient understands and wishes to proceed with the procedure. Written consent was obtained. Ultrasound was performed to localize and mark an adequate pocket of fluid in the RIGHT chest. The area was then prepped and draped in the normal sterile fashion. 1% Lidocaine was used for local anesthesia. Under ultrasound guidance a 5 [place] Yueh catheter was introduced. Thoracentesis was performed. The catheter was removed and a dressing applied. FINDINGS: A total of approximately 1 L of yellow RIGHT pleural fluid was removed. Samples were sent to the laboratory as requested by the clinical team. IMPRESSION: Successful ultrasound guided RIGHT thoracentesis yielding 1 L of pleural fluid. Electronically Signed   By: Lavonia Dana M.D.   On: 04/09/2021 14:23    Scheduled Meds:  amLODipine  5 mg Oral Daily   aspirin  300 mg Rectal Daily   Or   aspirin EC  325 mg Oral Daily   atorvastatin  40 mg Oral Daily   cloNIDine  0.1 mg Oral BID   furosemide  80 mg Intravenous Q12H   heparin  5,000 Units Subcutaneous Q8H   insulin aspart  0-6 Units Subcutaneous TID WC   isosorbide mononitrate  30 mg Oral Daily   Living Better with Heart Failure Book   Does not apply Once   metoprolol succinate  50 mg Oral Daily   mirtazapine  7.5 mg Oral QHS   pantoprazole  40 mg Oral Daily   sodium bicarbonate  650 mg Oral TID   Continuous Infusions:  ferric gluconate (FERRLECIT) IVPB 250 mg (04/10/21 0935)     LOS: 2 days   Fawn Kirk Parkinson, student [facility] Pager [phone]  If 7PM-7AM, please contact night-coverage [email or web address] Password [facility] 04/10/2021, 3:36 PM     ATTENDING NOTE   Patient seen and examined with medical student. In addition to supervising the encounter, I played a key  role in the decision making process as well as reviewed key findings.  Pt remains very ill.  She has persistent dense left hemiparesis and significant dysarthria.  Her renal function remains very poor and may require hemodialysis in near future. I would like a palliative care consultation for goals of care discussions.  Continue current treatments.    Gerlene Fee, MD How to contact the [facility] Attending or Consulting provider [place] or covering provider during after hours [place], for this patient?  Check the care team in [facility] and look for a) attending/consulting [place] provider listed and b)  the [facility] team listed Log into [email or web address] and use [place]'s universal password to access. If you do not have the password, please contact the hospital operator. Locate the [facility] provider you are looking for under [facility] and page to a number that you can be directly reached. If you still have difficulty reaching the provider, please page the [facility] (Director on Call) for the Hospitalists listed on amion for assistance.

## 2021-04-10 NOTE — Progress Notes (Signed)
This RN asked the patient if it is ok for Dr. Wynetta Emery to give medical information to her daughter [name].  Patient stated, "yes, it is ok".  Dr. Wynetta Emery made aware.

## 2021-04-10 NOTE — TOC Initial Note (Signed)
Transition of Care [facility]) - Initial/Assessment Note    Patient Details  Name: Tiffany Valencia MRN: [id number] Date of Birth: 04/28/1946  [facility] CM/SW Contact:    Boneta Lucks, RN Phone Number: 04/10/2021, 2:15 PM  Clinical Narrative:       PT is recommending SNF, [name] is agreeable wanting [facility]. TOC sent out in hub and faxed to [employer]. Ozella wants to review bed offers and ratings.             Expected Discharge Plan: [place] Barriers to Discharge: Continued Medical Work up   Patient Goals and CMS Choice Patient states their goals for this hospitalization and ongoing recovery are:: return home     Expected Discharge Plan and Services Expected Discharge Plan: [place] In-house Referral: Clinical Social Work   Post Acute Care Choice: [place] arrangements for the past 2 months: [place]                 DME Arranged: N/A     Prior Living Arrangements/Services Living arrangements for the past 2 months: Single Family Home Lives with:: Relatives Patient language and need for interpreter reviewed:: Yes Do you feel safe going back to the place where you live?: Yes      Need for Family Participation in Patient Care: Yes (Comment) Care giver support system in place?: Yes (comment) Current home services: DME (walker) Criminal Activity/Legal Involvement Pertinent to Current Situation/Hospitalization: No - Comment as needed  Activities of Daily Living Home Assistive Devices/Equipment: Walker (specify type) ADL Screening (condition at time of admission) Patient's cognitive ability adequate to safely complete daily activities?: No Is the patient deaf or have difficulty hearing?: Yes Does the patient have difficulty seeing, even when wearing glasses/contacts?: No Does the patient have difficulty concentrating, remembering, or making decisions?: Yes Patient able to express need for  assistance with ADLs?: No Does the patient have difficulty dressing or bathing?: Yes Independently performs ADLs?: No Communication: Independent Dressing (OT): Needs assistance Is this a change from baseline?: Pre-admission baseline Grooming: Needs assistance Is this a change from baseline?: Pre-admission baseline Feeding: Needs assistance Is this a change from baseline?: Pre-admission baseline Bathing: Dependent Is this a change from baseline?: Pre-admission baseline Toileting: Dependent Is this a change from baseline?: Pre-admission baseline In/Out Bed: Dependent Is this a change from baseline?: Pre-admission baseline Walks in Home: Needs assistance Is this a change from baseline?: Pre-admission baseline Does the patient have difficulty walking or climbing stairs?: Yes Weakness of Legs: Both Weakness of Arms/Hands: None  Permission Sought/Granted    Emotional Assessment   Attitude/Demeanor/Rapport: Unable to Assess Affect (typically observed): Unable to Assess   Alcohol / Substance Use: Not Applicable Psych Involvement: No (comment)  Admission diagnosis:  Acute heart failure (HCC) [I50.9] Pulmonary edema [J81.1] SOB (shortness of breath) [R06.02] Pleural effusion, right [J90] AKI (acute kidney injury) ([place]) [N17.9] Acute on chronic systolic CHF (congestive heart failure) (HCC) [I50.23] Chronic kidney disease, unspecified CKD stage [N18.9] Patient Active Problem List   Diagnosis Date Noted   AKI (acute kidney injury) ([place])    Cerebrovascular accident (CVA) due to embolism of right anterior cerebral artery ([place])    Acute heart failure ([place]) 04/08/2021   Gait instability 04/08/2021   DM (diabetes mellitus), type 2 with peripheral vascular complications ([place]) [id number]   Benign hypertension with CKD (chronic kidney disease) stage IV ([place]) 04/08/2021   Acquired thrombophilia ([place]) 04/08/2021   Chronic anticoagulation  04/08/2021   presumed Paroxysmal atrial fibrillation  04/08/2021   Hypertensive urgency 04/08/2021   Anemia in chronic kidney disease (CKD)    Pleural effusion on right    Altered mental state    Volume overload    Metabolic acidosis    Elevated brain natriuretic peptide (BNP) level    Hypoalbuminemia    Dysarthria    Coronary artery disease s/p STENT    Acute respiratory failure with hypoxia ([place])    PCP:  Pcp, No Pharmacy:   [facility] [address], [place] [address] Phone: [phone] Fax: [phone]     Social Determinants of Health (SDOH) Interventions    Readmission Risk Interventions No flowsheet data found.

## 2021-04-10 NOTE — Progress Notes (Signed)
04/10/2021 6:37 PM  I updated daughter Tiffany Valencia [phone] with patient's permission.    Gerlene Fee, MD  How to contact the [facility] Attending or Consulting provider [place] or covering provider during after hours [place], for this patient?  Check the care team in [facility] and look for a) attending/consulting [place] provider listed and b) the [facility] team listed Log into [email or web address] and use [place]'s universal password to access. If you do not have the password, please contact the hospital operator. Locate the [facility] provider you are looking for under [facility] and page to a number that you can be directly reached. If you still have difficulty reaching the provider, please page the [facility] (Director on Call) for the Hospitalists listed on amion for assistance.

## 2021-04-10 NOTE — Plan of Care (Signed)
  Problem: Acute Rehab PT Goals(only PT should resolve) Goal: Pt will Roll Supine to Side Outcome: Progressing Flowsheets (Taken 04/10/2021 1311) Pt will Roll Supine to Side:  with mod assist  with +2 Goal: Pt Will Go Supine/Side To Sit Outcome: Progressing Flowsheets (Taken 04/10/2021 1311) Pt will go Supine/Side to Sit:  with maximum assist  with +2 Goal: Pt Will Go Sit To Supine/Side Outcome: Progressing Flowsheets (Taken 04/10/2021 1311) Pt will go Sit to Supine/Side:  with maximum assist  with +2 Goal: Patient Will Transfer Sit To/From Stand Outcome: Progressing Flowsheets (Taken 04/10/2021 1311) Patient will transfer sit to/from stand:  with maximum assist  with +2 Goal: Pt Will Transfer Bed To Chair/Chair To Bed Outcome: Progressing Flowsheets (Taken 04/10/2021 1311) Pt will Transfer Bed to Chair/Chair to Bed:  with max assist  with +2  Tori Kaushik Maul PT, DPT 04/10/21, 1:11 PM

## 2021-04-10 NOTE — Progress Notes (Signed)
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  Had a thoracentesis of one liter and also 1100 of UOP-  her speech is still difficult to understand-  she is hungry this AM.  I asked her about her kidney doctor-  she remembers sodium bicarb   Objective Vital signs in last 24 hours: Vitals:   04/10/21 0008 04/10/21 0359 04/10/21 0414 04/10/21 0747  BP: (!) 162/74 (!) 156/59  (!) 158/70  Pulse: (!) 50 (!) 52  (!) 54  Resp: '19 20  16  '$ Temp: (!) 97.5 F (36.4 C) 97.7 F (36.5 C)  (!) 97.5 F (36.4 C)  TempSrc: Oral Oral  Oral  SpO2: 100% 99%  100%  Weight:   82.7 kg   Height:       Weight change: -3.493 kg  Intake/Output Summary (Last 24 hours) at 04/10/2021 0844 Last data filed at 04/10/2021 0414 Gross per 24 hour  Intake 730 ml  Output 1100 ml  Net -370 ml    Assessment/Plan: 75 year old BF history of many medical issues including CKD it seems-  now with failure to thrive  1.Renal- presumably has CKD since had a nephrologist prior to moving down here and is on some "nephrology type "  meds.  Crt is elevated giving poor GFR.  I asked her if her kidney doctor ever talked to her about dialysis.  "No, we didn't talk about that.  They want to put everyone on dialysis, I dont want it" There are no absolute indications for dialysis but her renal failure could certainly be playing a part in her failure to thrive.  Her BUN is not exceedingly high however so unclear how much actual immediate benefit she would get from starting HD.    Will need to discuss with family what their expectations are-  I spoke to pts friend Ozella who does not want to make decisions but family it does not seem is that involved.  she gave me a number she thought was her daughter but it is not in service. The other issue is that she is very weak.  Not sure she could withstand the rigors of chronic  dialysis therapy.  Will need to make sure everyone knows what she will be in for if dialysis is added to the mix.  Primary team is getting palliative care involved as well  2. Hypertension/volume  - BP is fine-  is overloaded with pleural effusion.  Right now on lasix 40 IV BID with some results-  will inc to 80 BID.  Is not hypoxic.   3. Anemia  - significant.  Iron is low as well.  treating with IV iron and ESA 4. Hypothyroid-  per primary-  not bad enough that I would think is playing a big role   Louis Meckel    Labs: Basic Metabolic Panel: Recent Labs  Lab 04/08/21 1135 04/09/21 0558 04/10/21 0659  NA 136 139 137  K 3.7 4.0 4.1  CL 111 113* 110  CO2 18* 21* 21*  GLUCOSE 192* 104* 127*  BUN 50* 51* 52*  CREATININE 6.41* 6.72* 6.88*  CALCIUM 7.6* 7.8* 7.8*  PHOS  --  5.4* 6.5*   Liver Function Tests: Recent Labs  Lab 04/08/21 1135 04/09/21 0558 04/10/21 0659  AST 24  --   --   ALT 27  --   --  ALKPHOS 83  --   --   BILITOT 0.6  --   --   PROT 7.8  --   --   ALBUMIN 2.7* 2.3* 2.3*   No results for input(s): LIPASE, AMYLASE in the last 168 hours. No results for input(s): AMMONIA in the last 168 hours. CBC: Recent Labs  Lab 04/08/21 1135 04/09/21 0558 04/10/21 0659  WBC 5.5 4.2 4.9  NEUTROABS 3.6  --   --   HGB 8.3* 7.6* 8.0*  HCT 28.8* 26.2* 28.7*  MCV 92.6 92.3 94.1  PLT 153 123* 137*   Cardiac Enzymes: No results for input(s): CKTOTAL, CKMB, CKMBINDEX, TROPONINI in the last 168 hours. CBG: Recent Labs  Lab 04/09/21 1614 04/09/21 2133 04/10/21 0311 04/10/21 0352 04/10/21 0747  GLUCAP 96 105* 60* 107* 97    Iron Studies:  Recent Labs    04/08/21 1444  IRON 24*  TIBC 180*  FERRITIN 198   Studies/Results: DG Chest 1 View  Result Date: 04/09/2021 CLINICAL DATA:  RIGHT pleural effusion post thoracentesis EXAM: CHEST  1 VIEW COMPARISON:  Exam at 1341 hours compared to 0451 hours FINDINGS: Resolution of RIGHT pleural effusion. Minimal  residual bibasilar atelectasis. No pneumothorax. Enlargement of cardiac silhouette persists. Atherosclerotic calcification aorta. IMPRESSION: No pneumothorax following RIGHT thoracentesis. Minimal bibasilar atelectasis. Electronically Signed   By: Lavonia Dana M.D.   On: 04/09/2021 14:37   DG Chest 1 View  Result Date: 04/08/2021 CLINICAL DATA:  [place] this morning. Low O2 sats. Confusion for 2 days. EXAM: CHEST  1 VIEW COMPARISON:  None. FINDINGS: Heart is enlarged but also accentuated by technique. Moderate RIGHT pleural effusion is noted, with opacification of the RIGHT lung base which obscures the RIGHT hemidiaphragm. Pulmonary vascular congestion is present. LEFT lung is otherwise clear. No pneumothorax. No acute displaced fractures. IMPRESSION: Cardiomegaly and pulmonary vascular congestion. RIGHT pleural effusion and RIGHT LOWER lobe opacity consistent with atelectasis or infiltrate. Electronically Signed   By: Nolon Nations M.D.   On: 04/08/2021 12:30   CT Head Wo Contrast  Result Date: 04/08/2021 CLINICAL DATA:  Mental status change. EXAM: CT HEAD WITHOUT CONTRAST TECHNIQUE: Contiguous axial images were obtained from the base of the skull through the vertex without intravenous contrast. COMPARISON:  None. FINDINGS: Brain: Ventricles and sulci are prominent compatible with atrophy. Periventricular and subcortical white matter hypodensities compatible with chronic microvascular ischemic changes. Low-attenuation throughout the majority of the left cerebellar hemisphere. 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 No evidence for acute intracranial hemorrhage or significant mass effect. Vascular: Unremarkable. Skull: Intact. Sinuses/Orbits: Paranasal sinuses are well aerated. Mastoid air cells are unremarkable. Other: None. IMPRESSION: Low-attenuation within the majority of the left cerebellar hemisphere may represent subacute to chronic infarct, in the absence of priors. Consider  further evaluation with MRI as clinically indicated. Atrophy and chronic microvascular ischemic changes. Electronically Signed   By: Lovey Newcomer M.D.   On: 04/08/2021 12:13   MR ANGIO HEAD WO CONTRAST  Result Date: 04/09/2021 CLINICAL DATA:  Altered mental status, does not communicate EXAM: MRI HEAD WITHOUT CONTRAST MRA HEAD WITHOUT CONTRAST MRA NECK WITHOUT CONTRAST TECHNIQUE: Multiplanar, multiecho pulse sequences of the brain and surrounding structures were obtained without intravenous contrast. Angiographic images of the Circle of Willis were obtained using MRA technique without intravenous contrast. Angiographic images of the neck were obtained using MRA technique without intravenous contrast. Carotid stenosis measurements (when applicable) are obtained utilizing NASCET criteria, using the distal internal carotid diameter as the denominator. COMPARISON:  Same-day noncontrast CT head FINDINGS: Image quality is significantly degraded by motion artifact. MRI HEAD FINDINGS Brain: There is diffusion restriction in the right ACA distribution involving the right aspect of the genu of the corpus callosum and right parasagittal frontal lobe. The FLAIR sequences markedly motion degraded, but there appears to be associated FLAIR signal abnormality. Findings consistent with evolving early subacute infarct. Susceptibility artifact along the falx on the T2* sequence is likely due to bulky dural calcifications seen on the same-day head CT. There is no convincing evidence of hemorrhagic transformation. There is a large remote infarct in the left cerebellar hemisphere and a small remote infarct in the right cerebellar hemisphere. There is a background of mild-to-moderate global parenchymal volume loss with enlargement of the ventricular system and extra-axial CSF spaces. Additional foci of T2/FLAIR signal abnormality in the subcortical and periventricular white matter likely reflects sequela of chronic white matter  microangiopathy. Vascular: The major intracranial flow voids are present. Skull and upper cervical spine: Normal marrow signal. Sinuses/Orbits: The imaged paranasal sinuses are clear. Bilateral lens implants are in place. The globes and orbits are otherwise unremarkable. Other: There is a left mastoid effusion. MRA HEAD FINDINGS Evaluation of the intracranial vasculature is markedly degraded by motion artifact. Anterior circulation: The right intracranial ICA appears patent. Evaluation of the left intracranial ICA is markedly degraded, but appears to be patent. The bilateral M1 segments are patent. The distal branches appear overall patent, but are not well evaluated. The proximal anterior cerebral arteries are patent. The distal anterior cerebral arteries are not well evaluated. Anterior circulation aneurysm can not be excluded. Posterior circulation: The V4 segments of the vertebral arteries are patent. The basilar artery is patent. The proximal PCAs are patent. The distal branches are not well evaluated. Posterior circulation aneurysms can not be excluded. Anatomic variants: The posterior communicating arteries are not definitively identified. MRA NECK FINDINGS Evaluation of the vasculature of the neck is markedly degraded by motion artifact. Aortic arch: Not evaluated. Right carotid System: Time-of-flight MRA demonstrates antegrade flow in the right carotid system. Stenosis or dissection can not be evaluated. Left carotid System: Time-of-flight MRA demonstrates antegrade flow in the left carotid system. Stenosis or dissection can not be evaluated. Vertebral arteries: Time-of-flight MRA demonstrates antegrade flow in the vertebral arteries. Stenosis or dissection can not be evaluated. IMPRESSION: 1. Markedly motion degraded study with essentially nondiagnostic MRA of the head and neck. 2. Early subacute infarct in the right ACA distribution without definite evidence of hemorrhagic transformation. 3. The  intracranial vasculature is markedly suboptimally evaluated. The proximal vessels appear grossly patent. The distal vasculature is not evaluated. 4. Time-of-flight MRA demonstrates antegrade flow in the carotid systems and vertebral arteries. Stenoses or dissection can not be evaluated. 5. 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 6. Consider CTA of the head/neck for evaluation of the vasculature as indicated given reduced susceptibility to motion artifact. Electronically Signed   By: Valetta Mole M.D.   On: 04/09/2021 11:08   MR ANGIO NECK WO CONTRAST  Result Date: 04/09/2021 CLINICAL DATA:  Altered mental status, does not communicate EXAM: MRI HEAD WITHOUT CONTRAST MRA HEAD WITHOUT CONTRAST MRA NECK WITHOUT CONTRAST TECHNIQUE: Multiplanar, multiecho pulse sequences of the brain and surrounding structures were obtained without intravenous contrast. Angiographic images of the Circle of Willis were obtained using MRA technique without intravenous contrast. Angiographic images of the neck were obtained using MRA technique without intravenous contrast. Carotid stenosis measurements (when applicable) are obtained utilizing  NASCET criteria, using the distal internal carotid diameter as the denominator. COMPARISON:  Same-day noncontrast CT head FINDINGS: Image quality is significantly degraded by motion artifact. MRI HEAD FINDINGS Brain: There is diffusion restriction in the right ACA distribution involving the right aspect of the genu of the corpus callosum and right parasagittal frontal lobe. The FLAIR sequences markedly motion degraded, but there appears to be associated FLAIR signal abnormality. Findings consistent with evolving early subacute infarct. Susceptibility artifact along the falx on the T2* sequence is likely due to bulky dural calcifications seen on the same-day head CT. There is no convincing evidence of hemorrhagic transformation. There is a large remote infarct  in the left cerebellar hemisphere and a small remote infarct in the right cerebellar hemisphere. There is a background of mild-to-moderate global parenchymal volume loss with enlargement of the ventricular system and extra-axial CSF spaces. Additional foci of T2/FLAIR signal abnormality in the subcortical and periventricular white matter likely reflects sequela of chronic white matter microangiopathy. Vascular: The major intracranial flow voids are present. Skull and upper cervical spine: Normal marrow signal. Sinuses/Orbits: The imaged paranasal sinuses are clear. Bilateral lens implants are in place. The globes and orbits are otherwise unremarkable. Other: There is a left mastoid effusion. MRA HEAD FINDINGS Evaluation of the intracranial vasculature is markedly degraded by motion artifact. Anterior circulation: The right intracranial ICA appears patent. Evaluation of the left intracranial ICA is markedly degraded, but appears to be patent. The bilateral M1 segments are patent. The distal branches appear overall patent, but are not well evaluated. The proximal anterior cerebral arteries are patent. The distal anterior cerebral arteries are not well evaluated. Anterior circulation aneurysm can not be excluded. Posterior circulation: The V4 segments of the vertebral arteries are patent. The basilar artery is patent. The proximal PCAs are patent. The distal branches are not well evaluated. Posterior circulation aneurysms can not be excluded. Anatomic variants: The posterior communicating arteries are not definitively identified. MRA NECK FINDINGS Evaluation of the vasculature of the neck is markedly degraded by motion artifact. Aortic arch: Not evaluated. Right carotid System: Time-of-flight MRA demonstrates antegrade flow in the right carotid system. Stenosis or dissection can not be evaluated. Left carotid System: Time-of-flight MRA demonstrates antegrade flow in the left carotid system. Stenosis or dissection can  not be evaluated. Vertebral arteries: Time-of-flight MRA demonstrates antegrade flow in the vertebral arteries. Stenosis or dissection can not be evaluated. IMPRESSION: 1. Markedly motion degraded study with essentially nondiagnostic MRA of the head and neck. 2. Early subacute infarct in the right ACA distribution without definite evidence of hemorrhagic transformation. 3. The intracranial vasculature is markedly suboptimally evaluated. The proximal vessels appear grossly patent. The distal vasculature is not evaluated. 4. Time-of-flight MRA demonstrates antegrade flow in the carotid systems and vertebral arteries. Stenoses or dissection can not be evaluated. 5. 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 6. Consider CTA of the head/neck for evaluation of the vasculature as indicated given reduced susceptibility to motion artifact. Electronically Signed   By: Valetta Mole M.D.   On: 04/09/2021 11:08   MR BRAIN WO CONTRAST  Result Date: 04/09/2021 CLINICAL DATA:  Altered mental status, does not communicate EXAM: MRI HEAD WITHOUT CONTRAST MRA HEAD WITHOUT CONTRAST MRA NECK WITHOUT CONTRAST TECHNIQUE: Multiplanar, multiecho pulse sequences of the brain and surrounding structures were obtained without intravenous contrast. Angiographic images of the Circle of Willis were obtained using MRA technique without intravenous contrast. Angiographic images of the neck were obtained using MRA  technique without intravenous contrast. Carotid stenosis measurements (when applicable) are obtained utilizing NASCET criteria, using the distal internal carotid diameter as the denominator. COMPARISON:  Same-day noncontrast CT head FINDINGS: Image quality is significantly degraded by motion artifact. MRI HEAD FINDINGS Brain: There is diffusion restriction in the right ACA distribution involving the right aspect of the genu of the corpus callosum and right parasagittal frontal lobe. The FLAIR  sequences markedly motion degraded, but there appears to be associated FLAIR signal abnormality. Findings consistent with evolving early subacute infarct. Susceptibility artifact along the falx on the T2* sequence is likely due to bulky dural calcifications seen on the same-day head CT. There is no convincing evidence of hemorrhagic transformation. There is a large remote infarct in the left cerebellar hemisphere and a small remote infarct in the right cerebellar hemisphere. There is a background of mild-to-moderate global parenchymal volume loss with enlargement of the ventricular system and extra-axial CSF spaces. Additional foci of T2/FLAIR signal abnormality in the subcortical and periventricular white matter likely reflects sequela of chronic white matter microangiopathy. Vascular: The major intracranial flow voids are present. Skull and upper cervical spine: Normal marrow signal. Sinuses/Orbits: The imaged paranasal sinuses are clear. Bilateral lens implants are in place. The globes and orbits are otherwise unremarkable. Other: There is a left mastoid effusion. MRA HEAD FINDINGS Evaluation of the intracranial vasculature is markedly degraded by motion artifact. Anterior circulation: The right intracranial ICA appears patent. Evaluation of the left intracranial ICA is markedly degraded, but appears to be patent. The bilateral M1 segments are patent. The distal branches appear overall patent, but are not well evaluated. The proximal anterior cerebral arteries are patent. The distal anterior cerebral arteries are not well evaluated. Anterior circulation aneurysm can not be excluded. Posterior circulation: The V4 segments of the vertebral arteries are patent. The basilar artery is patent. The proximal PCAs are patent. The distal branches are not well evaluated. Posterior circulation aneurysms can not be excluded. Anatomic variants: The posterior communicating arteries are not definitively identified. MRA NECK  FINDINGS Evaluation of the vasculature of the neck is markedly degraded by motion artifact. Aortic arch: Not evaluated. Right carotid System: Time-of-flight MRA demonstrates antegrade flow in the right carotid system. Stenosis or dissection can not be evaluated. Left carotid System: Time-of-flight MRA demonstrates antegrade flow in the left carotid system. Stenosis or dissection can not be evaluated. Vertebral arteries: Time-of-flight MRA demonstrates antegrade flow in the vertebral arteries. Stenosis or dissection can not be evaluated. IMPRESSION: 1. Markedly motion degraded study with essentially nondiagnostic MRA of the head and neck. 2. Early subacute infarct in the right ACA distribution without definite evidence of hemorrhagic transformation. 3. The intracranial vasculature is markedly suboptimally evaluated. The proximal vessels appear grossly patent. The distal vasculature is not evaluated. 4. Time-of-flight MRA demonstrates antegrade flow in the carotid systems and vertebral arteries. Stenoses or dissection can not be evaluated. 5. 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 6. Consider CTA of the head/neck for evaluation of the vasculature as indicated given reduced susceptibility to motion artifact. Electronically Signed   By: Valetta Mole M.D.   On: 04/09/2021 11:08   US RENAL  Result Date: 04/09/2021 CLINICAL DATA:  Pulmonary edema, pleural effusion, diabetes mellitus, hypertension, altered mental EXAM: RENAL / URINARY TRACT ULTRASOUND COMPLETE COMPARISON:  None FINDINGS: Right Kidney: Renal measurements: 10.3 x 5.6 x 5.5 cm = volume: 164 mL. Normal cortical thickness. Significantly increased cortical echogenicity. No hydronephrosis or shadowing calcification. Multiple RIGHT renal  cysts, largest 2.2 cm and 2.3 cm in greatest sizes. Left Kidney: No LEFT kidney visualized, question obscured, ectopic, or surgically versus developmentally absent. Bladder: Appears  normal for degree of bladder distention. Other: N/A IMPRESSION: Nonvisualization of LEFT kidney. Medical renal disease changes RIGHT kidney with multiple renal cysts. Electronically Signed   By: Lavonia Dana M.D.   On: 04/09/2021 14:39   DG Chest Port 1 View  Result Date: 04/09/2021 CLINICAL DATA:  75 year old female with history of right pleural effusion and pulmonary edema. EXAM: PORTABLE CHEST 1 VIEW COMPARISON:  Chest x-ray 04/08/2021. FINDINGS: Opacity in the base of the right hemithorax compatible with atelectasis and/or consolidation, with superimposed moderate to large right pleural effusion. Left lung appears clear. No left pleural effusion. Cephalization of the pulmonary vasculature. Heart size is mildly enlarged. Atherosclerotic calcifications in the thoracic aorta. IMPRESSION: 1. Persistent moderate to large right pleural effusion with atelectasis and/or consolidation in the right lung base. 2. Mild cardiomegaly. 3. Aortic atherosclerosis. 1. Electronically Signed   By: Vinnie Langton M.D.   On: 04/09/2021 05:22   ECHOCARDIOGRAM COMPLETE  Result Date: 04/09/2021    ECHOCARDIOGRAM REPORT   Patient Name:   Tiffany Valencia Date of Exam: 04/09/2021 Medical Rec #:  [id number]         Height:       63.5 in Accession #:    [id number]        Weight:       192.7 lb Date of Birth:  May 15, 1946         BSA:          1.914 m Patient Age:    80 years          BP:           167/86 mmHg Patient Gender: F                 HR:           82 bpm. Exam Location:  [facility] Procedure: 2D Echo, Cardiac Doppler and Color Doppler Indications:    Congestive Heart Failure  History:        Patient has no prior history of Echocardiogram examinations.                 CHF, CAD, Arrythmias:Atrial Fibrillation; Risk Factors:Diabetes                 and Hypertension.  Sonographer:    Wenda Low Referring Phys: [place]  1. Left ventricular ejection fraction, by estimation, is 60 to 65%. The left  ventricle has normal function. The left ventricle has no regional wall motion abnormalities. There is severe concentric left ventricular hypertrophy. Left ventricular diastolic  parameters are consistent with Grade II diastolic dysfunction (pseudonormalization). There is the interventricular septum is flattened in systole and diastole, consistent with right ventricular pressure and volume overload.  2. Right ventricular systolic function is mildly reduced. The right ventricular size is mildly enlarged. Mildly increased right ventricular wall thickness. There is severely elevated pulmonary artery systolic pressure. The estimated right ventricular systolic pressure is [id number] mmHg.  3. Left atrial size was moderately dilated.  4. The mitral valve is grossly normal. Mild mitral valve regurgitation.  5. Tricuspid valve regurgitation is moderate.  6. The aortic valve is tricuspid. Aortic valve regurgitation is not visualized. Mild aortic valve sclerosis is present, with no evidence of aortic valve stenosis. Aortic valve mean gradient measures 5.0 mmHg.  7. The inferior  vena cava is dilated in size with <50% respiratory variability, suggesting right atrial pressure of 15 mmHg. Comparison(s): No prior Echocardiogram. FINDINGS  Left Ventricle: Left ventricular ejection fraction, by estimation, is 60 to 65%. The left ventricle has normal function. The left ventricle has no regional wall motion abnormalities. The left ventricular internal cavity size was normal in size. There is  severe concentric left ventricular hypertrophy. The interventricular septum is flattened in systole and diastole, consistent with right ventricular pressure and volume overload. Left ventricular diastolic parameters are consistent with Grade II diastolic dysfunction (pseudonormalization). Right Ventricle: The right ventricular size is mildly enlarged. Mildly increased right ventricular wall thickness. Right ventricular systolic function is mildly  reduced. There is severely elevated pulmonary artery systolic pressure. The tricuspid regurgitant velocity is 3.46 m/s, and with an assumed right atrial pressure of 15 mmHg, the estimated right ventricular systolic pressure is [id number] mmHg. Left Atrium: Left atrial size was moderately dilated. Right Atrium: Right atrial size was normal in size. Pericardium: There is no evidence of pericardial effusion. Mitral Valve: The mitral valve is grossly normal. Mild mitral valve regurgitation. MV peak gradient, 4.0 mmHg. The mean mitral valve gradient is 1.0 mmHg. Tricuspid Valve: The tricuspid valve is grossly normal. Tricuspid valve regurgitation is moderate. Aortic Valve: The aortic valve is tricuspid. There is mild aortic valve annular calcification. Aortic valve regurgitation is not visualized. Mild aortic valve sclerosis is present, with no evidence of aortic valve stenosis. Aortic valve mean gradient measures 5.0 mmHg. Aortic valve peak gradient measures 9.1 mmHg. Aortic valve area, by VTI measures 1.69 cm. Pulmonic Valve: The pulmonic valve was grossly normal. Pulmonic valve regurgitation is trivial. Aorta: The aortic root is normal in size and structure. Venous: The inferior vena cava is dilated in size with less than 50% respiratory variability, suggesting right atrial pressure of 15 mmHg. IAS/Shunts: No atrial level shunt detected by color flow Doppler.  LEFT VENTRICLE PLAX 2D LVIDd:         4.30 cm  Diastology LVIDs:         2.90 cm  LV e' medial:    3.78 cm/s LV PW:         1.90 cm  LV E/e' medial:  22.3 LV IVS:        1.40 cm  LV e' lateral:   10.10 cm/s LVOT diam:     2.00 cm  LV E/e' lateral: 8.4 LV SV:         63 LV SV Index:   33 LVOT Area:     3.14 cm  RIGHT VENTRICLE RV Basal diam:  4.10 cm RV Mid diam:    3.20 cm LEFT ATRIUM              Index       RIGHT ATRIUM           Index LA diam:        4.30 cm  2.25 cm/m  RA Area:     18.40 cm LA Vol (A2C):   106.0 ml 55.37 ml/m RA Volume:   46.20 ml  24.13  ml/m LA Vol (A4C):   77.1 ml  40.27 ml/m LA Biplane Vol: 90.7 ml  47.38 ml/m  AORTIC VALVE                    PULMONIC VALVE AV Area (Vmax):    1.83 cm     PV Vmax:       0.77 m/s AV Area (  Vmean):   1.80 cm     PV Peak grad:  2.4 mmHg AV Area (VTI):     1.69 cm AV Vmax:           151.00 cm/s AV Vmean:          102.000 cm/s AV VTI:            0.372 m AV Peak Grad:      9.1 mmHg AV Mean Grad:      5.0 mmHg LVOT Vmax:         88.10 cm/s LVOT Vmean:        58.500 cm/s LVOT VTI:          0.200 m LVOT/AV VTI ratio: 0.54  AORTA Ao Root diam: 3.00 cm MITRAL VALVE               TRICUSPID VALVE MV Area (PHT): 2.68 cm    TR Peak grad:   47.9 mmHg MV Area VTI:   1.79 cm    TR Vmax:        346.00 cm/s MV Peak grad:  4.0 mmHg MV Mean grad:  1.0 mmHg    SHUNTS MV Vmax:       1.00 m/s    Systemic VTI:  0.20 m MV Vmean:      51.8 cm/s   Systemic Diam: 2.00 cm MV Decel Time: 283 msec MV E velocity: 84.40 cm/s MV A velocity: 78.40 cm/s MV E/A ratio:  1.08 Rozann Lesches MD Electronically signed by Rozann Lesches MD Signature Date/Time: 04/09/2021/4:11:51 PM    Final    CT HEAD CODE STROKE WO CONTRAST  Result Date: 04/09/2021 CLINICAL DATA:  Code stroke.  Altered mental status EXAM: CT HEAD WITHOUT CONTRAST TECHNIQUE: Contiguous axial images were obtained from the base of the skull through the vertex without intravenous contrast. COMPARISON:  Head CT from yesterday FINDINGS: Brain: New cytotoxic edema in the parasagittal right frontal lobe, ACA distribution. Chronic small vessel ischemia in the hemispheric white matter with chronic basal ganglia lacunar infarcts. Chronic left larger than right cerebellar infarcts. Encephalomalacia in the left frontal lobe at the anterior cranial fossa and anterior parasagittal region, likely also post ischemic. Vascular: No hyperdense vessel or unexpected calcification. Skull: Normal. Negative for fracture or focal lesion. Sinuses/Orbits: Bilateral cataract resection Other: These results  were communicated to Dr. Wynetta Emery at 9:40 am on 04/09/2021 by text page via the [facility] messaging system. IMPRESSION: 1. Acute cortical infarct is newly seen in the parasagittal right frontal lobe, ACA branch distribution. 2. Chronic ischemic injury as described, including remote left ACA territory infarcts. Electronically Signed   By: Jorje Guild M.D.   On: 04/09/2021 09:42   US THORACENTESIS ASP PLEURAL SPACE W/IMG GUIDE  Result Date: 04/09/2021 INDICATION: RIGHT pleural effusion EXAM: ULTRASOUND GUIDED DIAGNOSTIC AND THERAPEUTIC RIGHT THORACENTESIS MEDICATIONS: None. COMPLICATIONS: None immediate. PROCEDURE: An ultrasound guided thoracentesis was thoroughly discussed with the patient and questions answered. The benefits, risks, alternatives and complications were also discussed. The patient understands and wishes to proceed with the procedure. Written consent was obtained. Ultrasound was performed to localize and mark an adequate pocket of fluid in the RIGHT chest. The area was then prepped and draped in the normal sterile fashion. 1% Lidocaine was used for local anesthesia. Under ultrasound guidance a 5 [place] Yueh catheter was introduced. Thoracentesis was performed. The catheter was removed and a dressing applied. FINDINGS: A total of approximately 1 L of yellow RIGHT pleural fluid was removed. Samples  were sent to the laboratory as requested by the clinical team. IMPRESSION: Successful ultrasound guided RIGHT thoracentesis yielding 1 L of pleural fluid. Electronically Signed   By: Lavonia Dana M.D.   On: 04/09/2021 14:23   Medications: Infusions:  ferric gluconate (FERRLECIT) IVPB 250 mg (04/09/21 1530)    Scheduled Medications:  amLODipine  5 mg Oral Daily   aspirin  300 mg Rectal Daily   Or   aspirin EC  325 mg Oral Daily   atorvastatin  40 mg Oral Daily   cloNIDine  0.1 mg Oral BID   furosemide  40 mg Intravenous Q12H   heparin  5,000 Units Subcutaneous Q8H   insulin aspart  0-6 Units  Subcutaneous TID WC   isosorbide mononitrate  30 mg Oral Daily   metoprolol succinate  50 mg Oral Daily   mirtazapine  7.5 mg Oral QHS   pantoprazole  40 mg Oral Daily   sodium bicarbonate  650 mg Oral TID    have reviewed scheduled and prn medications.  Physical Exam: General:  alert but weak-  oriented-  does not seem uremic  no nausea Heart: RRR Lungs:  dec BS at bases Abdomen: soft, non tender Extremities: pitting edema    04/10/2021,8:44 AM  LOS: 2 days

## 2021-04-10 NOTE — Progress Notes (Signed)
Patient's daughter called, Tiffany Valencia, requesting for information about the patient.  Ms. [name] claims that she is primary care giver for the patient in [place].  Asked the patient if it is ok to give information to her daughter and patient stated the she will call them tomorrow.  Ms. [name] requested to talk to the doctor, her cell phone number is [phone].  Dr. Wynetta Emery made aware.

## 2021-04-10 NOTE — NC FL2 (Signed)
[place] LEVEL OF CARE SCREENING TOOL     IDENTIFICATION  Patient Name: Tiffany Valencia Birthdate: [birth year] Sex: female Admission Date (Current Location): 04/08/2021  [facility] and [place] Number:  [employer] and Address:  [facility] [address], [place]      Provider Number: [id number]  Attending Physician Name and Address:  Murlean Iba, MD  Relative Name and Phone Number:  Lenoria Farrier  [phone]    Current Level of Care: Hospital Recommended Level of Care: [place] Prior Approval Number:    Date Approved/Denied:   PASRR Number: [id number]  Discharge Plan: SNF    Current Diagnoses: Patient Active Problem List   Diagnosis Date Noted   AKI (acute kidney injury) [facility])    Cerebrovascular accident (CVA) due to embolism of right anterior cerebral artery ([place])    Acute heart failure ([place]) 04/08/2021   Gait instability 04/08/2021   DM (diabetes mellitus), type 2 with peripheral vascular complications ([place]) [id number]   Benign hypertension with CKD (chronic kidney disease) stage IV ([place]) 04/08/2021   Acquired thrombophilia ([place]) 04/08/2021   Chronic anticoagulation 04/08/2021   presumed Paroxysmal atrial fibrillation 04/08/2021   Hypertensive urgency 04/08/2021   Anemia in chronic kidney disease (CKD)    Pleural effusion on right    Altered mental state    Volume overload    Metabolic acidosis    Elevated brain natriuretic peptide (BNP) level    Hypoalbuminemia    Dysarthria    Coronary artery disease s/p STENT    Acute respiratory failure with hypoxia (HCC)     Orientation RESPIRATION BLADDER Height & Weight     Self, Time, Situation, Place  Normal External catheter Weight: 82.7 kg Height:  5' 3.5" (161.3 cm)  BEHAVIORAL SYMPTOMS/MOOD NEUROLOGICAL BOWEL NUTRITION STATUS      Continent Diet (See DC summary)  AMBULATORY STATUS COMMUNICATION OF NEEDS Skin   Extensive  Assist Verbally Skin abrasions                       Personal Care Assistance Level of Assistance  Bathing, Feeding, Dressing Bathing Assistance: Limited assistance Feeding assistance: Independent Dressing Assistance: Limited assistance     Functional Limitations Info  Sight, Hearing, Speech Sight Info: Adequate Hearing Info: Adequate Speech Info: Adequate    SPECIAL CARE FACTORS FREQUENCY  PT (By licensed PT)     PT Frequency: 5 times a week              Contractures Contractures Info: Not present    Additional Factors Info  Code Status, Allergies Code Status Info: FULL Allergies Info: NKDA           Current Medications (04/10/2021):  This is the current hospital active medication list Current Facility-Administered Medications  Medication Dose Route Frequency Provider Last Rate Last Admin   acetaminophen (TYLENOL) tablet 650 mg  650 mg Oral Q6H PRN Johnson, Clanford L, MD       Or   acetaminophen (TYLENOL) suppository 650 mg  650 mg Rectal Q6H PRN Johnson, Clanford L, MD       albuterol (PROVENTIL) (2.5 MG/3ML) 0.083% nebulizer solution 2.5 mg  2.5 mg Nebulization Q2H PRN Johnson, Clanford L, MD       amLODipine (NORVASC) tablet 5 mg  5 mg Oral Daily Johnson, Clanford L, MD       aspirin suppository 300 mg  300 mg Rectal Daily Rosalin Hawking, MD  Or   aspirin EC tablet 325 mg  325 mg Oral Daily Rosalin Hawking, MD   325 mg at 04/10/21 0913   atorvastatin (LIPITOR) tablet 40 mg  40 mg Oral Daily Johnson, Clanford L, MD   40 mg at 04/10/21 0913   bisacodyl (DULCOLAX) EC tablet 5 mg  5 mg Oral Daily PRN Wynetta Emery, Clanford L, MD       cloNIDine (CATAPRES) tablet 0.1 mg  0.1 mg Oral BID Wynetta Emery, Clanford L, MD   0.1 mg at 04/09/21 2108   ferric gluconate (FERRLECIT) 250 mg in sodium chloride 0.9 % 250 mL IVPB  250 mg Intravenous Daily Corliss Parish, MD 135 mL/hr at 04/10/21 0935 250 mg at 04/10/21 0935   furosemide (LASIX) injection 80 mg  80 mg Intravenous  Q12H Corliss Parish, MD       heparin injection 5,000 Units  5,000 Units Subcutaneous Q8H Johnson, Clanford L, MD   5,000 Units at 04/10/21 1357   hydrALAZINE (APRESOLINE) injection 10 mg  10 mg Intravenous Q4H PRN Johnson, Clanford L, MD   10 mg at 04/08/21 1709   insulin aspart (novoLOG) injection 0-6 Units  0-6 Units Subcutaneous TID WC Johnson, Clanford L, MD       isosorbide mononitrate (IMDUR) 24 hr tablet 30 mg  30 mg Oral Daily Johnson, Clanford L, MD       Living Better with Heart Failure Book   Does not apply Once Johnson, Clanford L, MD       metoprolol succinate (TOPROL-XL) 24 hr tablet 50 mg  50 mg Oral Daily Johnson, Clanford L, MD       mirtazapine (REMERON) tablet 7.5 mg  7.5 mg Oral QHS Johnson, Clanford L, MD   7.5 mg at 04/09/21 2100   ondansetron (ZOFRAN) tablet 4 mg  4 mg Oral Q6H PRN Johnson, Clanford L, MD       Or   ondansetron (ZOFRAN) injection 4 mg  4 mg Intravenous Q6H PRN Johnson, Clanford L, MD       oxyCODONE (Oxy IR/ROXICODONE) immediate release tablet 2.5 mg  2.5 mg Oral Q6H PRN Johnson, Clanford L, MD   2.5 mg at 04/10/21 1141   pantoprazole (PROTONIX) EC tablet 40 mg  40 mg Oral Daily Johnson, Clanford L, MD   40 mg at 04/10/21 [id number]   sodium bicarbonate tablet 650 mg  650 mg Oral TID Wynetta Emery, Clanford L, MD   650 mg at 04/10/21 0913   traZODone (DESYREL) tablet 25 mg  25 mg Oral QHS PRN Irwin Brakeman L, MD   25 mg at 04/08/21 2145     Discharge Medications: Please see discharge summary for a list of discharge medications.  Relevant Imaging Results:  Relevant Lab Results:   Additional Information SS# [id number]  Boneta Lucks, RN

## 2021-04-10 NOTE — Progress Notes (Signed)
Hypoglycemic Event  CBG: 60  Treatment: 8 oz juice/soda  Symptoms: None  Follow-up CBG: Time:0352 CBG Result:107  Possible Reasons for Event: Inadequate meal intake  Comments/MD notified:na    Jaynie Crumble

## 2021-04-11 ENCOUNTER — Encounter (HOSPITAL_COMMUNITY): Payer: Self-pay | Admitting: Family Medicine

## 2021-04-11 DIAGNOSIS — Z7189 Other specified counseling: Secondary | ICD-10-CM | POA: Diagnosis not present

## 2021-04-11 DIAGNOSIS — I63421 Cerebral infarction due to embolism of right anterior cerebral artery: Secondary | ICD-10-CM | POA: Diagnosis not present

## 2021-04-11 DIAGNOSIS — Z515 Encounter for palliative care: Secondary | ICD-10-CM | POA: Diagnosis not present

## 2021-04-11 DIAGNOSIS — J9601 Acute respiratory failure with hypoxia: Secondary | ICD-10-CM | POA: Diagnosis not present

## 2021-04-11 LAB — RENAL FUNCTION PANEL
Albumin: 2.1 g/dL — ABNORMAL LOW (ref 3.5–5.0)
Anion gap: 6 (ref 5–15)
BUN: 55 mg/dL — ABNORMAL HIGH (ref 8–23)
CO2: 21 mmol/L — ABNORMAL LOW (ref 22–32)
Calcium: 7.7 mg/dL — ABNORMAL LOW (ref 8.9–10.3)
Chloride: 111 mmol/L (ref 98–111)
Creatinine, Ser: 6.81 mg/dL — ABNORMAL HIGH (ref 0.44–1.00)
GFR, Estimated: 6 mL/min — ABNORMAL LOW (ref >60)
Glucose, Bld: 111 mg/dL — ABNORMAL HIGH (ref 70–99)
Phosphorus: 5.5 mg/dL — ABNORMAL HIGH (ref 2.5–4.6)
Potassium: 4 mmol/L (ref 3.5–5.1)
Sodium: 138 mmol/L (ref 135–145)

## 2021-04-11 LAB — CBC
HCT: 27.3 % — ABNORMAL LOW (ref 36.0–46.0)
Hemoglobin: 8.1 g/dL — ABNORMAL LOW (ref 12.0–15.0)
MCH: 27.2 pg (ref 26.0–34.0)
MCHC: 29.7 g/dL — ABNORMAL LOW (ref 30.0–36.0)
MCV: 91.6 fL (ref 80.0–100.0)
Platelets: 129 10*3/uL — ABNORMAL LOW (ref 150–400)
RBC: 2.98 MIL/uL — ABNORMAL LOW (ref 3.87–5.11)
RDW: 17.7 % — ABNORMAL HIGH (ref 11.5–15.5)
WBC: 5.5 10*3/uL (ref 4.0–10.5)
nRBC: 0 % (ref 0.0–0.2)

## 2021-04-11 LAB — GLUCOSE, CAPILLARY
Glucose-Capillary: 55 mg/dL — ABNORMAL LOW (ref 70–99)
Glucose-Capillary: 74 mg/dL (ref 70–99)
Glucose-Capillary: 92 mg/dL (ref 70–99)
Glucose-Capillary: 123 mg/dL — ABNORMAL HIGH (ref 70–99)
Glucose-Capillary: 100 mg/dL — ABNORMAL HIGH (ref 70–99)
Glucose-Capillary: 122 mg/dL — ABNORMAL HIGH (ref 70–99)

## 2021-04-11 LAB — BRAIN NATRIURETIC PEPTIDE: B Natriuretic Peptide: 1127 pg/mL — ABNORMAL HIGH (ref 0.0–100.0)

## 2021-04-11 LAB — MAGNESIUM: Magnesium: 1.7 mg/dL (ref 1.7–2.4)

## 2021-04-11 LAB — CYTOLOGY - NON PAP

## 2021-04-11 MED ORDER — APIXABAN 5 MG PO TABS
5.0000 mg | ORAL_TABLET | Freq: Two times a day (BID) | ORAL | Status: DC
  Administered 2021-04-12 – 2021-04-13 (×3): 5 mg via ORAL
  Filled 2021-04-11 (×3): qty 1

## 2021-04-11 NOTE — Progress Notes (Signed)
Patient Demographics:    Tiffany Valencia, is a 75 y.o. female, DOB - 02-Oct-1945, [id number]  Admit date - 04/08/2021   Admitting Physician Murlean Iba, MD  Outpatient Primary MD for the patient is Pcp, No  LOS - 3   Chief Complaint  Patient presents with   Edema        Subjective:    Tiffany Valencia today has no fevers, no emesis,  No chest pain,  - -Resting comfortably, somewhat confused and disoriented, very poor historian  Assessment  & Plan :    Principal Problem:   Acute respiratory failure with hypoxia ([place]) Active Problems:   Acute heart failure ([place])   Gait instability   DM (diabetes mellitus), type 2 with peripheral vascular complications (HCC)   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)   Anemia in chronic kidney disease (CKD)   Pleural effusion on right   Altered mental state   Volume overload   Metabolic acidosis   Elevated brain natriuretic peptide (BNP) level   Hypoalbuminemia   Dysarthria   Coronary artery disease s/p STENT   Acquired thrombophilia (HCC)   Chronic anticoagulation   presumed Paroxysmal atrial fibrillation   Hypertensive urgency   AKI (acute kidney injury) ([place])   Cerebrovascular accident (CVA) due to embolism of right anterior cerebral artery ([place])  Brief Summary:-  Tiffany Valencia is a 75 year old female recently brought to the area with family. Family report the patient was unable to care for herself in [place], brought her to [facility] for the past month. Reported hx includes:cad with stent 2013, htn, hld, ckd, afib, dm, and obesity, and CVA. No medical records were available from [place]/her previous providers. On 10/2 she was noted to be slow to respond, increased edema, was brought to the ED. Noted hypertension and edema, pleural effusion. 10/3 the patient was noted to be altered upon morning rounds,  code stroke was called, confirmed by ct and mri. Left extremity weakness remains, permissive hypertension remains, soon to restart her antihypertensives and apixaban.   A/p 1)ACUTE Cerebrovascular accident (CVA) due to embolism of right anterior cerebral artery on 04/09/21 --MR brain with MR head and neck angiography -not a candidate for thrombolytics per neurology given patient was on apixaban and last known normal not clear  -Neurology recommended RESTARTING apixaban on 04/12/21 -Aspirin, discontinued after 04/11/2021   2)ACUTE Cerebrovascular accident (CVA) due to embolism of right anterior cerebral artery  -code stroke with head ct at 0920 04/09/21. -conducted full stroke work up  -MR brain with MR head and neck angiography -not a candidate for thrombolytics per neurology given patient was on apixaban and last known normal not clear  -Inpatient neurology consultation requested -Neurology recommended RESTARTING apixaban after 3 Days - see notes     2)Acute diastolic heart failure -Echo from 04/09/2021 with EF of 60 to 65% and grade 2 diastolic dysfunction -Echo from 04/26/2021 also shows severe concentric LVH and interventricular septum is flattened in systole and diastolic consistent with right ventricular pressure and volume overload -Continue IV Lasix 40 mg BID for diuresis -Monitor weight and output and input closely    3)Acute respiratory failure with hypoxia -Secondary to heart failure exacerbation volume overload and large right-sided  pleural effusion-see echo report above #2 -- Rt sided ultrasound thoracentesis done 04/09/21 -  -Continue IV diuresis   4) DM2 (diabetes mellitus) controlled with renal complications -[id number] is 5.9 reflecting excellent diabetic control PTA -Use Novolog/Humalog Sliding scale insulin with Accu-Cheks/Fingersticks as ordered     5) Benign hypertension  -amlodipine, clonidine, permissive hypertension x 72 hours in setting of acute CVA -reducing metoprolol  and imdur with holding parameters -hydralazine iv for sbp>170    6) CKD (chronic kidney disease) stage IV -daily renal function labs -electrolyte correction as needed -appreciate nephrology consultation and recommendations -no indication for dialysis at this time -three times daily sodium bicarb tablet     7)Anemia in chronic kidney disease (CKD) -iron supplementation IV x 3 doses per nephrology team  8)  Coronary artery disease s/p STENT -continue home statin, imdur   9)  Paroxysmal Atrial fibrillation/Acquired thrombophilia / Chronic anticoagulation -ASA per neurology for now in setting of acute CVA  -Restart apixaban on 04/12/2021 please see #1 above  10)Social/Ethics--- palliative care consult requested to help delineate goals of care, and to also help delineate decision makers for patient as patient is currently not able to make informed decisions   DVT prophylaxis:   restart apixaban on 04/12/21 Code Status: full Family Communication: t/c to daughter w/ pt permission Disposition Plan: anticipating SNF placement      Consultants:  Neurology Nephrology Palliative care    Procedures:  -Ct head, code stroke -MR Angio head and neck wo contrast -MR brain wo contrast -US thoracentesis -US renal   Disposition/Need for in-Hospital Stay- patient unable to be discharged at this time due to --acute stroke requiring further work-up, will need SNF rehab*  Status is: Inpatient  Remains inpatient appropriate because: Please see disposition above  Disposition: The patient is from: Home              Anticipated d/c is to: SNF              Anticipated d/c date is: 2 days              Patient currently is not medically stable to d/c. Barriers: Not Clinically Stable-   Code Status :  -  Code Status: Full Code   Family Communication:   -Sister updated  Consults  :  Neuro/Palliative  DVT Prophylaxis  :   - SCDs  heparin injection 5,000 Units Start: 04/09/21 1400 Place TED  hose Start: 04/08/21 1409 apixaban (ELIQUIS) tablet 5 mg    Lab Results  Component Value Date   PLT 129 (L) 04/11/2021    Inpatient Medications  Scheduled Meds:  amLODipine  5 mg Oral Daily   [START ON 04/12/2021] apixaban  5 mg Oral BID   atorvastatin  40 mg Oral Daily   cloNIDine  0.1 mg Oral BID   heparin  5,000 Units Subcutaneous Q8H   insulin aspart  0-6 Units Subcutaneous TID WC   isosorbide mononitrate  30 mg Oral Daily   metoprolol succinate  50 mg Oral Daily   mirtazapine  7.5 mg Oral QHS   pantoprazole  40 mg Oral Daily   sodium bicarbonate  650 mg Oral TID   Continuous Infusions:  ferric gluconate (FERRLECIT) IVPB 250 mg (04/11/21 1017)   PRN Meds:.acetaminophen **OR** acetaminophen, albuterol, bisacodyl, hydrALAZINE, ondansetron **OR** ondansetron (ZOFRAN) IV, oxyCODONE, traZODone    Anti-infectives (From admission, onward)    None         Objective:   Vitals:  04/11/21 0412 04/11/21 0552 04/11/21 1006 04/11/21 1442  BP:  (!) 180/65 (!) 170/65 (!) 174/73  Pulse:  72 70 63  Resp:  '19 16 15  '$ Temp:  98.8 F (37.1 C) 98.4 F (36.9 C) 98.3 F (36.8 C)  TempSrc:  Oral Oral   SpO2:  91% 94% 95%  Weight: 86.8 kg     Height:        Wt Readings from Last 3 Encounters:  04/11/21 86.8 kg     Intake/Output Summary (Last 24 hours) at 04/11/2021 1513 Last data filed at 04/11/2021 0900 Gross per 24 hour  Intake 600 ml  Output 200 ml  Net 400 ml     Physical Exam  Gen:- Awake Alert, pleasantly confused at times HEENT:- [place].AT, No sclera icterus Neck-Supple Neck,No JVD,.  Lungs-  CTAB , fair symmetrical air movement CV- S1, S2 normal, irregular  Abd-  +ve B.Sounds, Abd Soft, No tenderness,    Extremity/Skin:- No  edema, pedal pulses present  Psych-disorientation, confusion, poor insight neuro-generalized weakness, no new focal deficits, no tremors   Data Review:   Micro Results Recent Results (from the past 240 hour(s))  Resp Panel by RT-PCR  (Flu A&B, Covid) Nasopharyngeal Swab     Status: None   Collection Time: 04/08/21 10:40 AM   Specimen: Nasopharyngeal Swab; Nasopharyngeal(NP) swabs in vial transport medium  Result Value Ref Range Status   SARS Coronavirus 2 by RT PCR NEGATIVE NEGATIVE Final    Comment: (NOTE) SARS-CoV-2 target nucleic acids are NOT DETECTED.  The SARS-CoV-2 RNA is generally detectable in upper respiratory specimens during the acute phase of infection. The lowest concentration of SARS-CoV-2 viral copies this assay can detect is 138 copies/mL. A negative result does not preclude SARS-Cov-2 infection and should not be used as the sole basis for treatment or other patient management decisions. A negative result may occur with  improper specimen collection/handling, submission of specimen other than nasopharyngeal swab, presence of viral mutation(s) within the areas targeted by this assay, and inadequate number of viral copies(<138 copies/mL). A negative result must be combined with clinical observations, patient history, and epidemiological information. The expected result is Negative.  Fact Sheet for Patients:  EntrepreneurPulse.com.au  Fact Sheet for Healthcare Providers:  IncredibleEmployment.be  This test is no t yet approved or cleared by the [place] FDA and  has been authorized for detection and/or diagnosis of SARS-CoV-2 by FDA under an Emergency Use Authorization (EUA). This EUA will remain  in effect (meaning this test can be used) for the duration of the COVID-19 declaration under Section 564(b)(1) of the Act, 21 U.S.C.section 360bbb-3(b)(1), unless the authorization is terminated  or revoked sooner.       Influenza A by PCR NEGATIVE NEGATIVE Final   Influenza B by PCR NEGATIVE NEGATIVE Final    Comment: (NOTE) The Xpert Xpress SARS-CoV-2/FLU/RSV plus assay is intended as an aid in the diagnosis of influenza from Nasopharyngeal swab specimens  and should not be used as a sole basis for treatment. Nasal washings and aspirates are unacceptable for Xpert Xpress SARS-CoV-2/FLU/RSV testing.  Fact Sheet for Patients: EntrepreneurPulse.com.au  Fact Sheet for Healthcare Providers: IncredibleEmployment.be  This test is not yet approved or cleared by the [place] FDA and has been authorized for detection and/or diagnosis of SARS-CoV-2 by FDA under an Emergency Use Authorization (EUA). This EUA will remain in effect (meaning this test can be used) for the duration of the COVID-19 declaration under Section 564(b)(1) of the Act, 21  U.S.C. section 360bbb-3(b)(1), unless the authorization is terminated or revoked.  Performed at [facility], [address], [place], [place] [address]   Culture, body fluid w Gram Stain-bottle     Status: None (Preliminary result)   Collection Time: 04/09/21  1:45 PM   Specimen: Pleura  Result Value Ref Range Status   Specimen Description PLEURAL RIGHT  Final   Special Requests NONE  Final   Culture   Final    NO GROWTH 2 DAYS Performed at [facility], [address], [place], [place] [address]    Report Status PENDING  Incomplete  Gram stain     Status: None   Collection Time: 04/09/21  1:45 PM   Specimen: Pleura  Result Value Ref Range Status   Specimen Description PLEURAL RIGHT  Final   Special Requests NONE  Final   Gram Stain   Final    NO ORGANISMS SEEN WBC SEEN CYTOSPIN SMEAR Performed at [facility], [address], [place], [place] [address]    Report Status 04/09/2021 FINAL  Final    Radiology Reports DG Chest 1 View  Result Date: 04/09/2021 CLINICAL DATA:  RIGHT pleural effusion post thoracentesis EXAM: CHEST  1 VIEW COMPARISON:  Exam at 1341 hours compared to 0451 hours FINDINGS: Resolution of RIGHT pleural effusion. Minimal residual bibasilar atelectasis. No pneumothorax. Enlargement of cardiac silhouette persists. Atherosclerotic  calcification aorta. IMPRESSION: No pneumothorax following RIGHT thoracentesis. Minimal bibasilar atelectasis. Electronically Signed   By: Lavonia Dana M.D.   On: 04/09/2021 14:37   DG Chest 1 View  Result Date: 04/08/2021 CLINICAL DATA:  [place] this morning. Low O2 sats. Confusion for 2 days. EXAM: CHEST  1 VIEW COMPARISON:  None. FINDINGS: Heart is enlarged but also accentuated by technique. Moderate RIGHT pleural effusion is noted, with opacification of the RIGHT lung base which obscures the RIGHT hemidiaphragm. Pulmonary vascular congestion is present. LEFT lung is otherwise clear. No pneumothorax. No acute displaced fractures. IMPRESSION: Cardiomegaly and pulmonary vascular congestion. RIGHT pleural effusion and RIGHT LOWER lobe opacity consistent with atelectasis or infiltrate. Electronically Signed   By: Nolon Nations M.D.   On: 04/08/2021 12:30   CT Head Wo Contrast  Result Date: 04/08/2021 CLINICAL DATA:  Mental status change. EXAM: CT HEAD WITHOUT CONTRAST TECHNIQUE: Contiguous axial images were obtained from the base of the skull through the vertex without intravenous contrast. COMPARISON:  None. FINDINGS: Brain: Ventricles and sulci are prominent compatible with atrophy. Periventricular and subcortical white matter hypodensities compatible with chronic microvascular ischemic changes. Low-attenuation throughout the majority of the left cerebellar hemisphere. 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 No evidence for acute intracranial hemorrhage or significant mass effect. Vascular: Unremarkable. Skull: Intact. Sinuses/Orbits: Paranasal sinuses are well aerated. Mastoid air cells are unremarkable. Other: None. IMPRESSION: Low-attenuation within the majority of the left cerebellar hemisphere may represent subacute to chronic infarct, in the absence of priors. Consider further evaluation with MRI as clinically indicated. Atrophy and chronic microvascular ischemic changes.  Electronically Signed   By: Lovey Newcomer M.D.   On: 04/08/2021 12:13   MR ANGIO HEAD WO CONTRAST  Result Date: 04/09/2021 CLINICAL DATA:  Altered mental status, does not communicate EXAM: MRI HEAD WITHOUT CONTRAST MRA HEAD WITHOUT CONTRAST MRA NECK WITHOUT CONTRAST TECHNIQUE: Multiplanar, multiecho pulse sequences of the brain and surrounding structures were obtained without intravenous contrast. Angiographic images of the Circle of Willis were obtained using MRA technique without intravenous contrast. Angiographic images of the neck were obtained using MRA  technique without intravenous contrast. Carotid stenosis measurements (when applicable) are obtained utilizing NASCET criteria, using the distal internal carotid diameter as the denominator. COMPARISON:  Same-day noncontrast CT head FINDINGS: Image quality is significantly degraded by motion artifact. MRI HEAD FINDINGS Brain: There is diffusion restriction in the right ACA distribution involving the right aspect of the genu of the corpus callosum and right parasagittal frontal lobe. The FLAIR sequences markedly motion degraded, but there appears to be associated FLAIR signal abnormality. Findings consistent with evolving early subacute infarct. Susceptibility artifact along the falx on the T2* sequence is likely due to bulky dural calcifications seen on the same-day head CT. There is no convincing evidence of hemorrhagic transformation. There is a large remote infarct in the left cerebellar hemisphere and a small remote infarct in the right cerebellar hemisphere. There is a background of mild-to-moderate global parenchymal volume loss with enlargement of the ventricular system and extra-axial CSF spaces. Additional foci of T2/FLAIR signal abnormality in the subcortical and periventricular white matter likely reflects sequela of chronic white matter microangiopathy. Vascular: The major intracranial flow voids are present. Skull and upper cervical spine: Normal  marrow signal. Sinuses/Orbits: The imaged paranasal sinuses are clear. Bilateral lens implants are in place. The globes and orbits are otherwise unremarkable. Other: There is a left mastoid effusion. MRA HEAD FINDINGS Evaluation of the intracranial vasculature is markedly degraded by motion artifact. Anterior circulation: The right intracranial ICA appears patent. Evaluation of the left intracranial ICA is markedly degraded, but appears to be patent. The bilateral M1 segments are patent. The distal branches appear overall patent, but are not well evaluated. The proximal anterior cerebral arteries are patent. The distal anterior cerebral arteries are not well evaluated. Anterior circulation aneurysm can not be excluded. Posterior circulation: The V4 segments of the vertebral arteries are patent. The basilar artery is patent. The proximal PCAs are patent. The distal branches are not well evaluated. Posterior circulation aneurysms can not be excluded. Anatomic variants: The posterior communicating arteries are not definitively identified. MRA NECK FINDINGS Evaluation of the vasculature of the neck is markedly degraded by motion artifact. Aortic arch: Not evaluated. Right carotid System: Time-of-flight MRA demonstrates antegrade flow in the right carotid system. Stenosis or dissection can not be evaluated. Left carotid System: Time-of-flight MRA demonstrates antegrade flow in the left carotid system. Stenosis or dissection can not be evaluated. Vertebral arteries: Time-of-flight MRA demonstrates antegrade flow in the vertebral arteries. Stenosis or dissection can not be evaluated. IMPRESSION: 1. Markedly motion degraded study with essentially nondiagnostic MRA of the head and neck. 2. Early subacute infarct in the right ACA distribution without definite evidence of hemorrhagic transformation. 3. The intracranial vasculature is markedly suboptimally evaluated. The proximal vessels appear grossly patent. The distal  vasculature is not evaluated. 4. Time-of-flight MRA demonstrates antegrade flow in the carotid systems and vertebral arteries. Stenoses or dissection can not be evaluated. 5. 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 6. Consider CTA of the head/neck for evaluation of the vasculature as indicated given reduced susceptibility to motion artifact. Electronically Signed   By: Valetta Mole M.D.   On: 04/09/2021 11:08   MR ANGIO NECK WO CONTRAST  Result Date: 04/09/2021 CLINICAL DATA:  Altered mental status, does not communicate EXAM: MRI HEAD WITHOUT CONTRAST MRA HEAD WITHOUT CONTRAST MRA NECK WITHOUT CONTRAST TECHNIQUE: Multiplanar, multiecho pulse sequences of the brain and surrounding structures were obtained without intravenous contrast. Angiographic images of the Circle of Willis were obtained using MRA  technique without intravenous contrast. Angiographic images of the neck were obtained using MRA technique without intravenous contrast. Carotid stenosis measurements (when applicable) are obtained utilizing NASCET criteria, using the distal internal carotid diameter as the denominator. COMPARISON:  Same-day noncontrast CT head FINDINGS: Image quality is significantly degraded by motion artifact. MRI HEAD FINDINGS Brain: There is diffusion restriction in the right ACA distribution involving the right aspect of the genu of the corpus callosum and right parasagittal frontal lobe. The FLAIR sequences markedly motion degraded, but there appears to be associated FLAIR signal abnormality. Findings consistent with evolving early subacute infarct. Susceptibility artifact along the falx on the T2* sequence is likely due to bulky dural calcifications seen on the same-day head CT. There is no convincing evidence of hemorrhagic transformation. There is a large remote infarct in the left cerebellar hemisphere and a small remote infarct in the right cerebellar hemisphere. There is a  background of mild-to-moderate global parenchymal volume loss with enlargement of the ventricular system and extra-axial CSF spaces. Additional foci of T2/FLAIR signal abnormality in the subcortical and periventricular white matter likely reflects sequela of chronic white matter microangiopathy. Vascular: The major intracranial flow voids are present. Skull and upper cervical spine: Normal marrow signal. Sinuses/Orbits: The imaged paranasal sinuses are clear. Bilateral lens implants are in place. The globes and orbits are otherwise unremarkable. Other: There is a left mastoid effusion. MRA HEAD FINDINGS Evaluation of the intracranial vasculature is markedly degraded by motion artifact. Anterior circulation: The right intracranial ICA appears patent. Evaluation of the left intracranial ICA is markedly degraded, but appears to be patent. The bilateral M1 segments are patent. The distal branches appear overall patent, but are not well evaluated. The proximal anterior cerebral arteries are patent. The distal anterior cerebral arteries are not well evaluated. Anterior circulation aneurysm can not be excluded. Posterior circulation: The V4 segments of the vertebral arteries are patent. The basilar artery is patent. The proximal PCAs are patent. The distal branches are not well evaluated. Posterior circulation aneurysms can not be excluded. Anatomic variants: The posterior communicating arteries are not definitively identified. MRA NECK FINDINGS Evaluation of the vasculature of the neck is markedly degraded by motion artifact. Aortic arch: Not evaluated. Right carotid System: Time-of-flight MRA demonstrates antegrade flow in the right carotid system. Stenosis or dissection can not be evaluated. Left carotid System: Time-of-flight MRA demonstrates antegrade flow in the left carotid system. Stenosis or dissection can not be evaluated. Vertebral arteries: Time-of-flight MRA demonstrates antegrade flow in the vertebral  arteries. Stenosis or dissection can not be evaluated. IMPRESSION: 1. Markedly motion degraded study with essentially nondiagnostic MRA of the head and neck. 2. Early subacute infarct in the right ACA distribution without definite evidence of hemorrhagic transformation. 3. The intracranial vasculature is markedly suboptimally evaluated. The proximal vessels appear grossly patent. The distal vasculature is not evaluated. 4. Time-of-flight MRA demonstrates antegrade flow in the carotid systems and vertebral arteries. Stenoses or dissection can not be evaluated. 5. 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 6. Consider CTA of the head/neck for evaluation of the vasculature as indicated given reduced susceptibility to motion artifact. Electronically Signed   By: Valetta Mole M.D.   On: 04/09/2021 11:08   MR BRAIN WO CONTRAST  Result Date: 04/09/2021 CLINICAL DATA:  Altered mental status, does not communicate EXAM: MRI HEAD WITHOUT CONTRAST MRA HEAD WITHOUT CONTRAST MRA NECK WITHOUT CONTRAST TECHNIQUE: Multiplanar, multiecho pulse sequences of the brain and surrounding structures were obtained without intravenous  contrast. Angiographic images of the Circle of Willis were obtained using MRA technique without intravenous contrast. Angiographic images of the neck were obtained using MRA technique without intravenous contrast. Carotid stenosis measurements (when applicable) are obtained utilizing NASCET criteria, using the distal internal carotid diameter as the denominator. COMPARISON:  Same-day noncontrast CT head FINDINGS: Image quality is significantly degraded by motion artifact. MRI HEAD FINDINGS Brain: There is diffusion restriction in the right ACA distribution involving the right aspect of the genu of the corpus callosum and right parasagittal frontal lobe. The FLAIR sequences markedly motion degraded, but there appears to be associated FLAIR signal abnormality. Findings  consistent with evolving early subacute infarct. Susceptibility artifact along the falx on the T2* sequence is likely due to bulky dural calcifications seen on the same-day head CT. There is no convincing evidence of hemorrhagic transformation. There is a large remote infarct in the left cerebellar hemisphere and a small remote infarct in the right cerebellar hemisphere. There is a background of mild-to-moderate global parenchymal volume loss with enlargement of the ventricular system and extra-axial CSF spaces. Additional foci of T2/FLAIR signal abnormality in the subcortical and periventricular white matter likely reflects sequela of chronic white matter microangiopathy. Vascular: The major intracranial flow voids are present. Skull and upper cervical spine: Normal marrow signal. Sinuses/Orbits: The imaged paranasal sinuses are clear. Bilateral lens implants are in place. The globes and orbits are otherwise unremarkable. Other: There is a left mastoid effusion. MRA HEAD FINDINGS Evaluation of the intracranial vasculature is markedly degraded by motion artifact. Anterior circulation: The right intracranial ICA appears patent. Evaluation of the left intracranial ICA is markedly degraded, but appears to be patent. The bilateral M1 segments are patent. The distal branches appear overall patent, but are not well evaluated. The proximal anterior cerebral arteries are patent. The distal anterior cerebral arteries are not well evaluated. Anterior circulation aneurysm can not be excluded. Posterior circulation: The V4 segments of the vertebral arteries are patent. The basilar artery is patent. The proximal PCAs are patent. The distal branches are not well evaluated. Posterior circulation aneurysms can not be excluded. Anatomic variants: The posterior communicating arteries are not definitively identified. MRA NECK FINDINGS Evaluation of the vasculature of the neck is markedly degraded by motion artifact. Aortic arch: Not  evaluated. Right carotid System: Time-of-flight MRA demonstrates antegrade flow in the right carotid system. Stenosis or dissection can not be evaluated. Left carotid System: Time-of-flight MRA demonstrates antegrade flow in the left carotid system. Stenosis or dissection can not be evaluated. Vertebral arteries: Time-of-flight MRA demonstrates antegrade flow in the vertebral arteries. Stenosis or dissection can not be evaluated. IMPRESSION: 1. Markedly motion degraded study with essentially nondiagnostic MRA of the head and neck. 2. Early subacute infarct in the right ACA distribution without definite evidence of hemorrhagic transformation. 3. The intracranial vasculature is markedly suboptimally evaluated. The proximal vessels appear grossly patent. The distal vasculature is not evaluated. 4. Time-of-flight MRA demonstrates antegrade flow in the carotid systems and vertebral arteries. Stenoses or dissection can not be evaluated. 5. 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 6. Consider CTA of the head/neck for evaluation of the vasculature as indicated given reduced susceptibility to motion artifact. Electronically Signed   By: Valetta Mole M.D.   On: 04/09/2021 11:08   US RENAL  Result Date: 04/09/2021 CLINICAL DATA:  Pulmonary edema, pleural effusion, diabetes mellitus, hypertension, altered mental EXAM: RENAL / URINARY TRACT ULTRASOUND COMPLETE COMPARISON:  None FINDINGS: Right Kidney: Renal measurements:  10.3 x 5.6 x 5.5 cm = volume: 164 mL. Normal cortical thickness. Significantly increased cortical echogenicity. No hydronephrosis or shadowing calcification. Multiple RIGHT renal cysts, largest 2.2 cm and 2.3 cm in greatest sizes. Left Kidney: No LEFT kidney visualized, question obscured, ectopic, or surgically versus developmentally absent. Bladder: Appears normal for degree of bladder distention. Other: N/A IMPRESSION: Nonvisualization of LEFT kidney. Medical renal  disease changes RIGHT kidney with multiple renal cysts. Electronically Signed   By: Lavonia Dana M.D.   On: 04/09/2021 14:39   DG Chest Port 1 View  Result Date: 04/09/2021 CLINICAL DATA:  75 year old female with history of right pleural effusion and pulmonary edema. EXAM: PORTABLE CHEST 1 VIEW COMPARISON:  Chest x-ray 04/08/2021. FINDINGS: Opacity in the base of the right hemithorax compatible with atelectasis and/or consolidation, with superimposed moderate to large right pleural effusion. Left lung appears clear. No left pleural effusion. Cephalization of the pulmonary vasculature. Heart size is mildly enlarged. Atherosclerotic calcifications in the thoracic aorta. IMPRESSION: 1. Persistent moderate to large right pleural effusion with atelectasis and/or consolidation in the right lung base. 2. Mild cardiomegaly. 3. Aortic atherosclerosis. 1. Electronically Signed   By: Vinnie Langton M.D.   On: 04/09/2021 05:22   ECHOCARDIOGRAM COMPLETE  Result Date: 04/09/2021    ECHOCARDIOGRAM REPORT   Patient Name:   GAYA BANKES Date of Exam: 04/09/2021 Medical Rec #:  [id number]         Height:       63.5 in Accession #:    [id number]        Weight:       192.7 lb Date of Birth:  04/22/1946         BSA:          1.914 m Patient Age:    73 years          BP:           167/86 mmHg Patient Gender: F                 HR:           82 bpm. Exam Location:  [facility] Procedure: 2D Echo, Cardiac Doppler and Color Doppler Indications:    Congestive Heart Failure  History:        Patient has no prior history of Echocardiogram examinations.                 CHF, CAD, Arrythmias:Atrial Fibrillation; Risk Factors:Diabetes                 and Hypertension.  Sonographer:    Wenda Low Referring Phys: [place]  1. Left ventricular ejection fraction, by estimation, is 60 to 65%. The left ventricle has normal function. The left ventricle has no regional wall motion abnormalities. There is severe  concentric left ventricular hypertrophy. Left ventricular diastolic  parameters are consistent with Grade II diastolic dysfunction (pseudonormalization). There is the interventricular septum is flattened in systole and diastole, consistent with right ventricular pressure and volume overload.  2. Right ventricular systolic function is mildly reduced. The right ventricular size is mildly enlarged. Mildly increased right ventricular wall thickness. There is severely elevated pulmonary artery systolic pressure. The estimated right ventricular systolic pressure is [id number] mmHg.  3. Left atrial size was moderately dilated.  4. The mitral valve is grossly normal. Mild mitral valve regurgitation.  5. Tricuspid valve regurgitation is moderate.  6. The aortic valve is tricuspid. Aortic valve regurgitation is not  visualized. Mild aortic valve sclerosis is present, with no evidence of aortic valve stenosis. Aortic valve mean gradient measures 5.0 mmHg.  7. The inferior vena cava is dilated in size with <50% respiratory variability, suggesting right atrial pressure of 15 mmHg. Comparison(s): No prior Echocardiogram. FINDINGS  Left Ventricle: Left ventricular ejection fraction, by estimation, is 60 to 65%. The left ventricle has normal function. The left ventricle has no regional wall motion abnormalities. The left ventricular internal cavity size was normal in size. There is  severe concentric left ventricular hypertrophy. The interventricular septum is flattened in systole and diastole, consistent with right ventricular pressure and volume overload. Left ventricular diastolic parameters are consistent with Grade II diastolic dysfunction (pseudonormalization). Right Ventricle: The right ventricular size is mildly enlarged. Mildly increased right ventricular wall thickness. Right ventricular systolic function is mildly reduced. There is severely elevated pulmonary artery systolic pressure. The tricuspid regurgitant velocity is 3.46  m/s, and with an assumed right atrial pressure of 15 mmHg, the estimated right ventricular systolic pressure is [id number] mmHg. Left Atrium: Left atrial size was moderately dilated. Right Atrium: Right atrial size was normal in size. Pericardium: There is no evidence of pericardial effusion. Mitral Valve: The mitral valve is grossly normal. Mild mitral valve regurgitation. MV peak gradient, 4.0 mmHg. The mean mitral valve gradient is 1.0 mmHg. Tricuspid Valve: The tricuspid valve is grossly normal. Tricuspid valve regurgitation is moderate. Aortic Valve: The aortic valve is tricuspid. There is mild aortic valve annular calcification. Aortic valve regurgitation is not visualized. Mild aortic valve sclerosis is present, with no evidence of aortic valve stenosis. Aortic valve mean gradient measures 5.0 mmHg. Aortic valve peak gradient measures 9.1 mmHg. Aortic valve area, by VTI measures 1.69 cm. Pulmonic Valve: The pulmonic valve was grossly normal. Pulmonic valve regurgitation is trivial. Aorta: The aortic root is normal in size and structure. Venous: The inferior vena cava is dilated in size with less than 50% respiratory variability, suggesting right atrial pressure of 15 mmHg. IAS/Shunts: No atrial level shunt detected by color flow Doppler.  LEFT VENTRICLE PLAX 2D LVIDd:         4.30 cm  Diastology LVIDs:         2.90 cm  LV e' medial:    3.78 cm/s LV PW:         1.90 cm  LV E/e' medial:  22.3 LV IVS:        1.40 cm  LV e' lateral:   10.10 cm/s LVOT diam:     2.00 cm  LV E/e' lateral: 8.4 LV SV:         63 LV SV Index:   33 LVOT Area:     3.14 cm  RIGHT VENTRICLE RV Basal diam:  4.10 cm RV Mid diam:    3.20 cm LEFT ATRIUM              Index       RIGHT ATRIUM           Index LA diam:        4.30 cm  2.25 cm/m  RA Area:     18.40 cm LA Vol (A2C):   106.0 ml 55.37 ml/m RA Volume:   46.20 ml  24.13 ml/m LA Vol (A4C):   77.1 ml  40.27 ml/m LA Biplane Vol: 90.7 ml  47.38 ml/m  AORTIC VALVE                     PULMONIC  VALVE AV Area (Vmax):    1.83 cm     PV Vmax:       0.77 m/s AV Area (Vmean):   1.80 cm     PV Peak grad:  2.4 mmHg AV Area (VTI):     1.69 cm AV Vmax:           151.00 cm/s AV Vmean:          102.000 cm/s AV VTI:            0.372 m AV Peak Grad:      9.1 mmHg AV Mean Grad:      5.0 mmHg LVOT Vmax:         88.10 cm/s LVOT Vmean:        58.500 cm/s LVOT VTI:          0.200 m LVOT/AV VTI ratio: 0.54  AORTA Ao Root diam: 3.00 cm MITRAL VALVE               TRICUSPID VALVE MV Area (PHT): 2.68 cm    TR Peak grad:   47.9 mmHg MV Area VTI:   1.79 cm    TR Vmax:        346.00 cm/s MV Peak grad:  4.0 mmHg MV Mean grad:  1.0 mmHg    SHUNTS MV Vmax:       1.00 m/s    Systemic VTI:  0.20 m MV Vmean:      51.8 cm/s   Systemic Diam: 2.00 cm MV Decel Time: 283 msec MV E velocity: 84.40 cm/s MV A velocity: 78.40 cm/s MV E/A ratio:  1.08 Rozann Lesches MD Electronically signed by Rozann Lesches MD Signature Date/Time: 04/09/2021/4:11:51 PM    Final    CT HEAD CODE STROKE WO CONTRAST  Result Date: 04/09/2021 CLINICAL DATA:  Code stroke.  Altered mental status EXAM: CT HEAD WITHOUT CONTRAST TECHNIQUE: Contiguous axial images were obtained from the base of the skull through the vertex without intravenous contrast. COMPARISON:  Head CT from yesterday FINDINGS: Brain: New cytotoxic edema in the parasagittal right frontal lobe, ACA distribution. Chronic small vessel ischemia in the hemispheric white matter with chronic basal ganglia lacunar infarcts. Chronic left larger than right cerebellar infarcts. Encephalomalacia in the left frontal lobe at the anterior cranial fossa and anterior parasagittal region, likely also post ischemic. Vascular: No hyperdense vessel or unexpected calcification. Skull: Normal. Negative for fracture or focal lesion. Sinuses/Orbits: Bilateral cataract resection Other: These results were communicated to Dr. Wynetta Emery at 9:40 am on 04/09/2021 by text page via the [facility] messaging system. IMPRESSION:  1. Acute cortical infarct is newly seen in the parasagittal right frontal lobe, ACA branch distribution. 2. Chronic ischemic injury as described, including remote left ACA territory infarcts. Electronically Signed   By: Jorje Guild M.D.   On: 04/09/2021 09:42   US THORACENTESIS ASP PLEURAL SPACE W/IMG GUIDE  Result Date: 04/09/2021 INDICATION: RIGHT pleural effusion EXAM: ULTRASOUND GUIDED DIAGNOSTIC AND THERAPEUTIC RIGHT THORACENTESIS MEDICATIONS: None. COMPLICATIONS: None immediate. PROCEDURE: An ultrasound guided thoracentesis was thoroughly discussed with the patient and questions answered. The benefits, risks, alternatives and complications were also discussed. The patient understands and wishes to proceed with the procedure. Written consent was obtained. Ultrasound was performed to localize and mark an adequate pocket of fluid in the RIGHT chest. The area was then prepped and draped in the normal sterile fashion. 1% Lidocaine was used for local anesthesia. Under ultrasound guidance a 5 [place] Yueh catheter was introduced. Thoracentesis  was performed. The catheter was removed and a dressing applied. FINDINGS: A total of approximately 1 L of yellow RIGHT pleural fluid was removed. Samples were sent to the laboratory as requested by the clinical team. IMPRESSION: Successful ultrasound guided RIGHT thoracentesis yielding 1 L of pleural fluid. Electronically Signed   By: Lavonia Dana M.D.   On: 04/09/2021 14:23     CBC Recent Labs  Lab 04/08/21 1135 04/09/21 0558 04/10/21 0659 04/11/21 0702  WBC 5.5 4.2 4.9 5.5  HGB 8.3* 7.6* 8.0* 8.1*  HCT 28.8* 26.2* 28.7* 27.3*  PLT 153 123* 137* 129*  MCV 92.6 92.3 94.1 91.6  MCH 26.7 26.8 26.2 27.2  MCHC 28.8* 29.0* 27.9* 29.7*  RDW 17.9* 18.0* 17.7* 17.7*  LYMPHSABS 1.5  --   --   --   MONOABS 0.3  --   --   --   EOSABS 0.1  --   --   --   BASOSABS 0.0  --   --   --     Chemistries  Recent Labs  Lab 04/08/21 1135 04/09/21 0558 04/10/21 0659  04/11/21 0702  NA 136 139 137 138  K 3.7 4.0 4.1 4.0  CL 111 113* 110 111  CO2 18* 21* 21* 21*  GLUCOSE 192* 104* 127* 111*  BUN 50* 51* 52* 55*  CREATININE 6.41* 6.72* 6.88* 6.81*  CALCIUM 7.6* 7.8* 7.8* 7.7*  MG  --  1.8 1.8 1.7  AST 24  --   --   --   ALT 27  --   --   --   ALKPHOS 83  --   --   --   BILITOT 0.6  --   --   --    ------------------------------------------------------------------------------------------------------------------ Recent Labs    04/09/21 0558  CHOL 99  HDL 39*  LDLCALC 49  TRIG 55  CHOLHDL 2.5    Lab Results  Component Value Date   HGBA1C 5.9 (H) 04/08/2021   ------------------------------------------------------------------------------------------------------------------ No results for input(s): TSH, T4TOTAL, T3FREE, THYROIDAB in the last 72 hours.  Invalid input(s): FREET3 ------------------------------------------------------------------------------------------------------------------ No results for input(s): VITAMINB12, FOLATE, FERRITIN, TIBC, IRON, RETICCTPCT in the last 72 hours.  Coagulation profile No results for input(s): INR, PROTIME in the last 168 hours.  No results for input(s): DDIMER in the last 72 hours.  Cardiac Enzymes No results for input(s): CKMB, TROPONINI, MYOGLOBIN in the last 168 hours.  Invalid input(s): CK ------------------------------------------------------------------------------------------------------------------    Component Value Date/Time   BNP 1,127.0 (H) 04/11/2021 [id number]     Roxan Hockey M.D on 04/11/2021 at 3:13 PM  Go to [email or web address] - for contact info  [facility] - Office  [phone]

## 2021-04-11 NOTE — TOC Progression Note (Signed)
Transition of Care [facility]) - Progression Note    Patient Details  Name: Tiffany Valencia MRN: [id number] Date of Birth: 1946-05-12  [facility] of [facility]) CM/SW Contact  Shade Flood, LCSW Phone Number: 04/11/2021, 12:48 PM  Clinical Narrative:     TOC following. Updated pt's facesheet with name/number of pt's daughter. Pt lives with her God-Sister who was the relative TOC spoke with yesterday. Daughter is in agreement with SNF rehab at [facility]. Updated daughter and pt's God Sister on bed offers and they select [facility]. Accepted bed offer in the hub. Voicemail message left for their admissions coordinator. TOC will follow.  Expected Discharge Plan: [place] Barriers to Discharge: Continued Medical Work up  Expected Discharge Plan and Services Expected Discharge Plan: [place] In-house Referral: Clinical Social Work   Post Acute Care Choice: [place] arrangements for the past 2 months: Single Family Home                 DME Arranged: N/A                     Social Determinants of Health (SDOH) Interventions    Readmission Risk Interventions No flowsheet data found.

## 2021-04-11 NOTE — Consult Note (Signed)
Consultation Note Date: 04/11/2021   Patient Name: Tiffany Valencia  DOB: 1945/11/06  MRN: [id number]  Age / Sex: 75 y.o., female  PCP: Pcp, No Referring Physician: Roxan Hockey, MD  Reason for Consultation: Establishing goals of care  HPI/Patient Profile: 75 y.o. 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   Clinical Assessment and Goals of Care: I have reviewed medical records including EPIC notes, labs and imaging, received report from RN, assessed the patient.  Tiffany Valencia is sitting up in bed.  She greets me, making and somewhat keeping eye contact.  She appears acutely/chronically ill, obese, and frail.  She is alert, oriented to person and situation.  I believe that she is able to make her basic needs known.  There is no family at bedside at this time.   We talked about her acute health concerns, and she is able to tell me that she has had a stroke.  We talked about her CKD.  She tells me that she had a nephrologist in [place], but has not seen nephrology in [place].  She tells me that her [place] nephrologist said that she was "getting better".  I share that her kidneys have taken a hit, and ask if she would take dialysis if needed.  She clearly tells me no.  I ask, "even if it means you die without it?"  Her response to this is, "take me on".  I asked if she has known anyone who had him hemodialysis.  She tells me that people who lives in her apartment building in [place] and that they were gone for hours.  I share that indeed, people on dialysis have to go to 3 times a week for 4 hours.  I again ask about her willingness to accept hemodialysis.  She again declines hemodialysis stating,  "take me on".  I asked if her family knows her wishes.  She tells me that they do not, but she will let them know "by the end of the day".  Tiffany Valencia tells me that she had 6 children, 3 boys and 3 girls.  She tells me that one of her sons died in 10/13/2017.  She agrees for me to call her daughter,[name] at [phone].  She tells me that Tiffany Valencia, listed in her chart is her daughter who is a Equities trader.  We talked about CODE STATUS.  At this point Tiffany Valencia tells me that she would not want life support.  When I ask if her family knows of her wishes she tells me that her friend, Rubye Beach does not, but her children do not.  She again shares that her family does not know her wishes.  Call to daughter,[name] to discuss diagnosis prognosis, [place], EOL wishes, disposition and options.  I introduced Palliative Medicine as specialized medical care for people living with  serious illness. It focuses on providing relief from the symptoms and stress of a serious illness. The goal is to improve quality of life for both the patient and the family.  We discussed a brief life review of the patient.  Tiffany Valencia shares that her mother moved with her friend Tiffany Valencia (they share grandchildren).  Tiffany Valencia tells me that she had been her mother's main caregiver prior to her moving to [place].  But she is able used to be and then focused on their current illness.  We talked in detail about stroke, and daughter shares that this is her mother's fourth stroke.  We talk in detail about CKD.  Daughter shares that patient told family that her kidneys were fine.  The natural disease trajectory and expectations at EOL were discussed.  I attempted to elicit values and goals of care important to the patient.  Daughter gets patient's sister on the line with [place].  I share my discussion with patient related to CKD and CODE STATUS.  Advanced directives, concepts specific to code status, artifical feeding and hydration, and  rehospitalization were considered and discussed.  Family shares that they are willing to continue discussion, respect patient's wishes as long as she fully understands her choices.  Palliative Care services outpatient were explained and offered.  Patient and family are agreeable to outpatient palliative to follow, continue further goals of care/CODE STATUS discussions.  Provider choice discussed, no preference.  Discussed the importance of continued conversation with family and the medical providers regarding overall plan of care and treatment options, ensuring decisions are within the context of the patient's values and GOCs. Questions and concerns were addressed.  The family was encouraged to call with questions or concerns.  PMT will continue to support holistically.  Conference with attending, bedside nursing staff, transition of care team related to patient condition, needs, goals of care, disposition.  Outpatient palliative to follow.   HCPOA  NEXT OF KIN -main contact is oldest daughter, Tiffany Valencia.  She had 6 children, 5 still living.  She has 1 living sister.    SUMMARY OF RECOMMENDATIONS   At this point continue to treat the treatable Short-term rehab Outpatient palliative to follow Patient endorses DNR, further discussions needed Patient declines HD even if it causes her to die [employer] Per family  Code Status/Advance Care Planning: Full code  Symptom Management:  Per hospitalist, no additional needs at this time.  Palliative Prophylaxis:  Frequent Pain Assessment and Turn Reposition  Additional Recommendations (Limitations, Scope, Preferences): Full Scope Treatment  Psycho-social/Spiritual:  Desire for further Chaplaincy support:no Additional Recommendations: Caregiving  Support/Resources and Education on Hospice  Prognosis:  Unable to determine, 6 to 12 months would not be surprising based on chronic illness burden, functional status, fourth stroke,  end-stage kidney disease  Discharge Planning:  Short-term rehab with outpatient palliative to follow       Primary Diagnoses: Present on Admission:  Gait instability  DM (diabetes mellitus), type 2 with peripheral vascular complications ([place])  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)  Anemia in chronic kidney disease (CKD)  Pleural effusion on right  Altered mental state  Volume overload  Metabolic acidosis  Elevated brain natriuretic peptide (BNP) level  Hypoalbuminemia  Dysarthria  Coronary artery disease s/p STENT  Acute respiratory failure with hypoxia (HCC)  Acquired thrombophilia ([place])  presumed Paroxysmal atrial fibrillation  Hypertensive urgency   I have reviewed the medical record, interviewed the patient and family, and examined the  patient. The following aspects are pertinent.  Past Medical History:  Diagnosis Date   Anemia in chronic kidney disease (CKD)    Asthma    Chronic kidney disease    Coronary artery disease    Diabetes mellitus without complication (HCC)    Hypertension    Social History   Socioeconomic History   Marital status: Unknown    Spouse name: Not on file   Number of children: Not on file   Years of education: Not on file   Highest education level: Not on file  Occupational History   Not on file  Tobacco Use   Smoking status: Never   Smokeless tobacco: Never  Vaping Use   Vaping Use: Never used  Substance and Sexual Activity   Alcohol use: Not Currently   Drug use: Not Currently   Sexual activity: Not on file  Other Topics Concern   Not on file  Social History Narrative   Not on file   Social Determinants of Health   Financial Resource Strain: Not on file  Food Insecurity: Not on file  Transportation Needs: Not on file  Physical Activity: Not on file  Stress: Not on file  Social Connections: Not on file   History reviewed. No pertinent family history. Scheduled Meds:  amLODipine  5 mg  Oral Daily   aspirin  300 mg Rectal Daily   Or   aspirin EC  325 mg Oral Daily   atorvastatin  40 mg Oral Daily   cloNIDine  0.1 mg Oral BID   furosemide  80 mg Intravenous Q12H   heparin  5,000 Units Subcutaneous Q8H   insulin aspart  0-6 Units Subcutaneous TID WC   isosorbide mononitrate  30 mg Oral Daily   metoprolol succinate  50 mg Oral Daily   mirtazapine  7.5 mg Oral QHS   pantoprazole  40 mg Oral Daily   sodium bicarbonate  650 mg Oral TID   Continuous Infusions:  ferric gluconate (FERRLECIT) IVPB 250 mg (04/10/21 0935)   PRN Meds:.acetaminophen **OR** acetaminophen, albuterol, bisacodyl, hydrALAZINE, ondansetron **OR** ondansetron (ZOFRAN) IV, oxyCODONE, traZODone Medications Prior to Admission:  Prior to Admission medications   Medication Sig Start Date End Date Taking? Authorizing Provider  albuterol (VENTOLIN HFA) 108 (90 Base) MCG/ACT inhaler Inhale into the lungs. 02/03/21  Yes [provider]  amLODipine (NORVASC) 10 MG tablet Take 10 mg by mouth daily. 01/25/21  Yes [provider]  atorvastatin (LIPITOR) 80 MG tablet Take 40 mg by mouth daily. 12/26/20  Yes [provider]  ELIQUIS 2.5 MG TABS tablet Take 2.5 mg by mouth 2 (two) times daily. 03/15/21  Yes [provider]  furosemide (LASIX) 20 MG tablet Take 20 mg by mouth daily. 03/24/21  Yes [provider]  isosorbide mononitrate (IMDUR) 60 MG 24 hr tablet Take 60 mg by mouth daily. 03/15/21  Yes [provider]  LANTUS SOLOSTAR 100 UNIT/ML Solostar Pen Inject 10 Units into the skin daily. 03/15/21  Yes [provider]  metoprolol succinate (TOPROL-XL) 100 MG 24 hr tablet Take 200 mg by mouth daily. 03/19/21  Yes [provider]  mirtazapine (REMERON) 7.5 MG tablet Take 7.5 mg by mouth at bedtime. 12/29/20  Yes [provider]  sodium bicarbonate 650 MG tablet Take 1,300 mg by mouth daily. 03/31/21  Yes [provider]  triamcinolone cream  (KENALOG) 0.1 % SMARTSIG:1 Application Topical 2-3 Times Daily 03/21/21  Yes [provider]  cloNIDine (CATAPRES) 0.2 MG tablet  Take 0.2 mg by mouth 3 (three) times daily. Patient not taking: No sig reported 02/14/21   [provider]  famotidine (PEPCID) 20 MG tablet Take by mouth. Patient not taking: No sig reported 02/09/21   [provider]  folic acid (FOLVITE) 1 MG tablet Take 1 mg by mouth daily. Patient not taking: No sig reported 02/08/21   [provider]  hydrALAZINE (APRESOLINE) 100 MG tablet Take 100 mg by mouth 3 (three) times daily. Patient not taking: No sig reported 02/07/21   [provider]  hydrocortisone (ANUSOL-HC) 25 MG suppository Place 25 mg rectally 2 (two) times daily. Patient not taking: No sig reported 02/07/21   [provider]  NIFEdipine (ADALAT CC) 30 MG 24 hr tablet Take by mouth. Patient not taking: No sig reported 10/23/20   [provider]  Vitamin D, Ergocalciferol, (DRISDOL) 1.25 MG (50000 UNIT) CAPS capsule Take by mouth. Patient not taking: No sig reported 12/29/20   [provider]   No Known Allergies Review of Systems  Unable to perform ROS: Other   Physical Exam Vitals and nursing note reviewed.  Constitutional:      Appearance: She is obese. She is ill-appearing.  HENT:     Head: Normocephalic and atraumatic.     Mouth/Throat:     Mouth: Mucous membranes are moist.  Cardiovascular:     Rate and Rhythm: Normal rate.  Pulmonary:     Effort: Pulmonary effort is normal. No respiratory distress.  Skin:    General: Skin is warm and dry.  Neurological:     Mental Status: She is alert.     Comments: Oriented to person and place only  Psychiatric:     Comments: Calm and cooperative    Vital Signs: BP (!) 180/65 (BP Location: Right Arm)   Pulse 72   Temp 98.8 F (37.1 C) (Oral)   Resp 19   Ht 5' 3.5" (1.613 m)   Wt 86.8 kg   SpO2 91%   BMI 33.37 kg/m  Pain Scale:  0-10 POSS *See Group Information*: 1-Acceptable,Awake and alert Pain Score: 0-No pain   SpO2: SpO2: 91 % O2 Device:SpO2: 91 % O2 Flow Rate: .O2 Flow Rate (L/min): 1 L/min  IO: Intake/output summary:  Intake/Output Summary (Last 24 hours) at 04/11/2021 [id number] Last data filed at 04/10/2021 1746 Gross per 24 hour  Intake 480 ml  Output 450 ml  Net 30 ml    LBM: Last BM Date: 04/09/21 Baseline Weight: Weight: 86.2 kg Most recent weight: Weight: 86.8 kg     Palliative Assessment/Data:   Flowsheet Rows    Flowsheet Row Most Recent Value  Intake Tab   Referral Department Hospitalist  Unit at Time of Referral Cardiac/Telemetry Unit  Palliative Care Primary Diagnosis Neurology  Date Notified 04/08/21  Palliative Care Type New Palliative care  Reason for referral Clarify Goals of Care  Date of Admission 04/08/21  Date first seen by Palliative Care 04/11/21  # of days Palliative referral response time 3 Day(s)  # of days IP prior to Palliative referral 0  Clinical Assessment   Palliative Performance Scale Score 40%  Pain Max last 24 hours Not able to report  Pain Min Last 24 hours Not able to report  Dyspnea Max Last 24 Hours Not able to report  Dyspnea Min Last 24 hours Not able to report  Psychosocial & Spiritual Assessment   Palliative Care Outcomes        Time In: 0920 Time  Out: 1040 Time Total: 80 minutes  Greater than 50%  of this time was spent counseling and coordinating care related to the above assessment and plan.  Signed by: Drue Novel, NP   Please contact Palliative Medicine Team phone at [phone] for questions and concerns.  For individual provider: See Shea Evans

## 2021-04-11 NOTE — Progress Notes (Addendum)
Subjective:   had 750 ml Uop over 10/4.  It looks like primary spoke with daughter overnight.  I contacted primary team and they have called palliative to assist with goals of care.  Just got some lasix.  She just started seeing nephrology she thinks.  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  Had a thoracentesis of one liter  Review of systems: denies n/v Denies shortness of breath or chest pain  Objective Vital signs in last 24 hours: Vitals:   04/10/21 2234 04/11/21 0412 04/11/21 0552 04/11/21 1006  BP: (!) 179/78  (!) 180/65 (!) 170/65  Pulse:   72 70  Resp:   19 16  Temp:   98.8 F (37.1 C) 98.4 F (36.9 C)  TempSrc:   Oral Oral  SpO2:   91% 94%  Weight:  86.8 kg    Height:       Weight change: 4.064 kg  Intake/Output Summary (Last 24 hours) at 04/11/2021 1019 Last data filed at 04/11/2021 0900 Gross per 24 hour  Intake 840 ml  Output 450 ml  Net 390 ml    Assessment/Plan: 75 year old BF history of many medical issues including CKD it seems-  now with failure to thrive   1.Renal- presumably has CKD since had a nephrologist prior to moving down here and is on some "nephrology type "  meds.  Crt is elevated giving poor GFR.  I asked her if her kidney doctor ever talked to her about dialysis.  "No, we didn't talk about that.  They want to put everyone on dialysis, I dont want it" There are no absolute indications for dialysis but her renal failure could certainly be playing a part in her failure to thrive.  Her BUN is not exceedingly high however so unclear how much actual immediate benefit she would get from starting HD.    - Will need to discuss with family what their expectations are-  I spoke to pts friend Ozella who does not want to make decisions but family it does not seem is that involved. The other issue is that she  is very weak.  Not sure she could withstand the rigors of chronic dialysis therapy.  Will need to make sure everyone knows what she will be in for if dialysis is added to the mix.   - Primary team is getting palliative care involved as well - agree and appreciate their assistance - on lasix - got this am at 80 mg IV dose.  Will pause - to start, will request prior records from PCP if available  2. Hypertension/volume  - overloaded with pleural effusion.  lasix was increased to 80 mg BID IV. not hypoxic.  will pause for now and reassess in am  3. Anemia renal failure - Iron deficiency as well.  treating with IV iron x 4 doses and s/p aranesp 200 mcg once on 10/3  4. Hypothyroid-  per primary-  not bad enough that I would think is playing a big role  5. Metabolic acidosis - continue bicarbonate   Labs: Basic Metabolic Panel: Recent Labs  Lab 04/09/21 0558 04/10/21 0659 04/11/21 0702  NA 139 137 138  K 4.0 4.1 4.0  CL 113* 110 111  CO2 21* 21* 21*  GLUCOSE 104* 127* 111*  BUN 51* 52*  55*  CREATININE 6.72* 6.88* 6.81*  CALCIUM 7.8* 7.8* 7.7*  PHOS 5.4* 6.5* 5.5*   Liver Function Tests: Recent Labs  Lab 04/08/21 1135 04/09/21 0558 04/10/21 0659 04/11/21 0702  AST 24  --   --   --   ALT 27  --   --   --   ALKPHOS 83  --   --   --   BILITOT 0.6  --   --   --   PROT 7.8  --   --   --   ALBUMIN 2.7* 2.3* 2.3* 2.1*   No results for input(s): LIPASE, AMYLASE in the last 168 hours. No results for input(s): AMMONIA in the last 168 hours. CBC: Recent Labs  Lab 04/08/21 1135 04/09/21 0558 04/10/21 0659 04/11/21 0702  WBC 5.5 4.2 4.9 5.5  NEUTROABS 3.6  --   --   --   HGB 8.3* 7.6* 8.0* 8.1*  HCT 28.8* 26.2* 28.7* 27.3*  MCV 92.6 92.3 94.1 91.6  PLT 153 123* 137* 129*   Cardiac Enzymes: No results for input(s): CKTOTAL, CKMB, CKMBINDEX, TROPONINI in the last 168 hours. CBG: Recent Labs  Lab 04/10/21 1107 04/10/21 1641 04/10/21 2205 04/11/21 0250 04/11/21 0322   GLUCAP 120* 163* 92 55* 74    Iron Studies:  Recent Labs    04/08/21 1444  IRON 24*  TIBC 180*  FERRITIN 198   Studies/Results: DG Chest 1 View  Result Date: 04/09/2021 CLINICAL DATA:  RIGHT pleural effusion post thoracentesis EXAM: CHEST  1 VIEW COMPARISON:  Exam at 1341 hours compared to 0451 hours FINDINGS: Resolution of RIGHT pleural effusion. Minimal residual bibasilar atelectasis. No pneumothorax. Enlargement of cardiac silhouette persists. Atherosclerotic calcification aorta. IMPRESSION: No pneumothorax following RIGHT thoracentesis. Minimal bibasilar atelectasis. Electronically Signed   By: Lavonia Dana M.D.   On: 04/09/2021 14:37   MR ANGIO HEAD WO CONTRAST  Result Date: 04/09/2021 CLINICAL DATA:  Altered mental status, does not communicate EXAM: MRI HEAD WITHOUT CONTRAST MRA HEAD WITHOUT CONTRAST MRA NECK WITHOUT CONTRAST TECHNIQUE: Multiplanar, multiecho pulse sequences of the brain and surrounding structures were obtained without intravenous contrast. Angiographic images of the Circle of Willis were obtained using MRA technique without intravenous contrast. Angiographic images of the neck were obtained using MRA technique without intravenous contrast. Carotid stenosis measurements (when applicable) are obtained utilizing NASCET criteria, using the distal internal carotid diameter as the denominator. COMPARISON:  Same-day noncontrast CT head FINDINGS: Image quality is significantly degraded by motion artifact. MRI HEAD FINDINGS Brain: There is diffusion restriction in the right ACA distribution involving the right aspect of the genu of the corpus callosum and right parasagittal frontal lobe. The FLAIR sequences markedly motion degraded, but there appears to be associated FLAIR signal abnormality. Findings consistent with evolving early subacute infarct. Susceptibility artifact along the falx on the T2* sequence is likely due to bulky dural calcifications seen on the same-day head CT.  There is no convincing evidence of hemorrhagic transformation. There is a large remote infarct in the left cerebellar hemisphere and a small remote infarct in the right cerebellar hemisphere. There is a background of mild-to-moderate global parenchymal volume loss with enlargement of the ventricular system and extra-axial CSF spaces. Additional foci of T2/FLAIR signal abnormality in the subcortical and periventricular white matter likely reflects sequela of chronic white matter microangiopathy. Vascular: The major intracranial flow voids are present. Skull and upper cervical spine: Normal marrow signal. Sinuses/Orbits: The imaged paranasal sinuses are clear. Bilateral lens implants are in place.  The globes and orbits are otherwise unremarkable. Other: There is a left mastoid effusion. MRA HEAD FINDINGS Evaluation of the intracranial vasculature is markedly degraded by motion artifact. Anterior circulation: The right intracranial ICA appears patent. Evaluation of the left intracranial ICA is markedly degraded, but appears to be patent. The bilateral M1 segments are patent. The distal branches appear overall patent, but are not well evaluated. The proximal anterior cerebral arteries are patent. The distal anterior cerebral arteries are not well evaluated. Anterior circulation aneurysm can not be excluded. Posterior circulation: The V4 segments of the vertebral arteries are patent. The basilar artery is patent. The proximal PCAs are patent. The distal branches are not well evaluated. Posterior circulation aneurysms can not be excluded. Anatomic variants: The posterior communicating arteries are not definitively identified. MRA NECK FINDINGS Evaluation of the vasculature of the neck is markedly degraded by motion artifact. Aortic arch: Not evaluated. Right carotid System: Time-of-flight MRA demonstrates antegrade flow in the right carotid system. Stenosis or dissection can not be evaluated. Left carotid System:  Time-of-flight MRA demonstrates antegrade flow in the left carotid system. Stenosis or dissection can not be evaluated. Vertebral arteries: Time-of-flight MRA demonstrates antegrade flow in the vertebral arteries. Stenosis or dissection can not be evaluated. IMPRESSION: 1. Markedly motion degraded study with essentially nondiagnostic MRA of the head and neck. 2. Early subacute infarct in the right ACA distribution without definite evidence of hemorrhagic transformation. 3. The intracranial vasculature is markedly suboptimally evaluated. The proximal vessels appear grossly patent. The distal vasculature is not evaluated. 4. Time-of-flight MRA demonstrates antegrade flow in the carotid systems and vertebral arteries. Stenoses or dissection can not be evaluated. 5. 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 6. Consider CTA of the head/neck for evaluation of the vasculature as indicated given reduced susceptibility to motion artifact. Electronically Signed   By: Valetta Mole M.D.   On: 04/09/2021 11:08   MR ANGIO NECK WO CONTRAST  Result Date: 04/09/2021 CLINICAL DATA:  Altered mental status, does not communicate EXAM: MRI HEAD WITHOUT CONTRAST MRA HEAD WITHOUT CONTRAST MRA NECK WITHOUT CONTRAST TECHNIQUE: Multiplanar, multiecho pulse sequences of the brain and surrounding structures were obtained without intravenous contrast. Angiographic images of the Circle of Willis were obtained using MRA technique without intravenous contrast. Angiographic images of the neck were obtained using MRA technique without intravenous contrast. Carotid stenosis measurements (when applicable) are obtained utilizing NASCET criteria, using the distal internal carotid diameter as the denominator. COMPARISON:  Same-day noncontrast CT head FINDINGS: Image quality is significantly degraded by motion artifact. MRI HEAD FINDINGS Brain: There is diffusion restriction in the right ACA distribution involving  the right aspect of the genu of the corpus callosum and right parasagittal frontal lobe. The FLAIR sequences markedly motion degraded, but there appears to be associated FLAIR signal abnormality. Findings consistent with evolving early subacute infarct. Susceptibility artifact along the falx on the T2* sequence is likely due to bulky dural calcifications seen on the same-day head CT. There is no convincing evidence of hemorrhagic transformation. There is a large remote infarct in the left cerebellar hemisphere and a small remote infarct in the right cerebellar hemisphere. There is a background of mild-to-moderate global parenchymal volume loss with enlargement of the ventricular system and extra-axial CSF spaces. Additional foci of T2/FLAIR signal abnormality in the subcortical and periventricular white matter likely reflects sequela of chronic white matter microangiopathy. Vascular: The major intracranial flow voids are present. Skull and upper cervical spine: Normal marrow signal.  Sinuses/Orbits: The imaged paranasal sinuses are clear. Bilateral lens implants are in place. The globes and orbits are otherwise unremarkable. Other: There is a left mastoid effusion. MRA HEAD FINDINGS Evaluation of the intracranial vasculature is markedly degraded by motion artifact. Anterior circulation: The right intracranial ICA appears patent. Evaluation of the left intracranial ICA is markedly degraded, but appears to be patent. The bilateral M1 segments are patent. The distal branches appear overall patent, but are not well evaluated. The proximal anterior cerebral arteries are patent. The distal anterior cerebral arteries are not well evaluated. Anterior circulation aneurysm can not be excluded. Posterior circulation: The V4 segments of the vertebral arteries are patent. The basilar artery is patent. The proximal PCAs are patent. The distal branches are not well evaluated. Posterior circulation aneurysms can not be excluded.  Anatomic variants: The posterior communicating arteries are not definitively identified. MRA NECK FINDINGS Evaluation of the vasculature of the neck is markedly degraded by motion artifact. Aortic arch: Not evaluated. Right carotid System: Time-of-flight MRA demonstrates antegrade flow in the right carotid system. Stenosis or dissection can not be evaluated. Left carotid System: Time-of-flight MRA demonstrates antegrade flow in the left carotid system. Stenosis or dissection can not be evaluated. Vertebral arteries: Time-of-flight MRA demonstrates antegrade flow in the vertebral arteries. Stenosis or dissection can not be evaluated. IMPRESSION: 1. Markedly motion degraded study with essentially nondiagnostic MRA of the head and neck. 2. Early subacute infarct in the right ACA distribution without definite evidence of hemorrhagic transformation. 3. The intracranial vasculature is markedly suboptimally evaluated. The proximal vessels appear grossly patent. The distal vasculature is not evaluated. 4. Time-of-flight MRA demonstrates antegrade flow in the carotid systems and vertebral arteries. Stenoses or dissection can not be evaluated. 5. 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 6. Consider CTA of the head/neck for evaluation of the vasculature as indicated given reduced susceptibility to motion artifact. Electronically Signed   By: Valetta Mole M.D.   On: 04/09/2021 11:08   MR BRAIN WO CONTRAST  Result Date: 04/09/2021 CLINICAL DATA:  Altered mental status, does not communicate EXAM: MRI HEAD WITHOUT CONTRAST MRA HEAD WITHOUT CONTRAST MRA NECK WITHOUT CONTRAST TECHNIQUE: Multiplanar, multiecho pulse sequences of the brain and surrounding structures were obtained without intravenous contrast. Angiographic images of the Circle of Willis were obtained using MRA technique without intravenous contrast. Angiographic images of the neck were obtained using MRA technique without  intravenous contrast. Carotid stenosis measurements (when applicable) are obtained utilizing NASCET criteria, using the distal internal carotid diameter as the denominator. COMPARISON:  Same-day noncontrast CT head FINDINGS: Image quality is significantly degraded by motion artifact. MRI HEAD FINDINGS Brain: There is diffusion restriction in the right ACA distribution involving the right aspect of the genu of the corpus callosum and right parasagittal frontal lobe. The FLAIR sequences markedly motion degraded, but there appears to be associated FLAIR signal abnormality. Findings consistent with evolving early subacute infarct. Susceptibility artifact along the falx on the T2* sequence is likely due to bulky dural calcifications seen on the same-day head CT. There is no convincing evidence of hemorrhagic transformation. There is a large remote infarct in the left cerebellar hemisphere and a small remote infarct in the right cerebellar hemisphere. There is a background of mild-to-moderate global parenchymal volume loss with enlargement of the ventricular system and extra-axial CSF spaces. Additional foci of T2/FLAIR signal abnormality in the subcortical and periventricular white matter likely reflects sequela of chronic white matter microangiopathy. Vascular: The major intracranial  flow voids are present. Skull and upper cervical spine: Normal marrow signal. Sinuses/Orbits: The imaged paranasal sinuses are clear. Bilateral lens implants are in place. The globes and orbits are otherwise unremarkable. Other: There is a left mastoid effusion. MRA HEAD FINDINGS Evaluation of the intracranial vasculature is markedly degraded by motion artifact. Anterior circulation: The right intracranial ICA appears patent. Evaluation of the left intracranial ICA is markedly degraded, but appears to be patent. The bilateral M1 segments are patent. The distal branches appear overall patent, but are not well evaluated. The proximal anterior  cerebral arteries are patent. The distal anterior cerebral arteries are not well evaluated. Anterior circulation aneurysm can not be excluded. Posterior circulation: The V4 segments of the vertebral arteries are patent. The basilar artery is patent. The proximal PCAs are patent. The distal branches are not well evaluated. Posterior circulation aneurysms can not be excluded. Anatomic variants: The posterior communicating arteries are not definitively identified. MRA NECK FINDINGS Evaluation of the vasculature of the neck is markedly degraded by motion artifact. Aortic arch: Not evaluated. Right carotid System: Time-of-flight MRA demonstrates antegrade flow in the right carotid system. Stenosis or dissection can not be evaluated. Left carotid System: Time-of-flight MRA demonstrates antegrade flow in the left carotid system. Stenosis or dissection can not be evaluated. Vertebral arteries: Time-of-flight MRA demonstrates antegrade flow in the vertebral arteries. Stenosis or dissection can not be evaluated. IMPRESSION: 1. Markedly motion degraded study with essentially nondiagnostic MRA of the head and neck. 2. Early subacute infarct in the right ACA distribution without definite evidence of hemorrhagic transformation. 3. The intracranial vasculature is markedly suboptimally evaluated. The proximal vessels appear grossly patent. The distal vasculature is not evaluated. 4. Time-of-flight MRA demonstrates antegrade flow in the carotid systems and vertebral arteries. Stenoses or dissection can not be evaluated. 5. 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 6. Consider CTA of the head/neck for evaluation of the vasculature as indicated given reduced susceptibility to motion artifact. Electronically Signed   By: Valetta Mole M.D.   On: 04/09/2021 11:08   US RENAL  Result Date: 04/09/2021 CLINICAL DATA:  Pulmonary edema, pleural effusion, diabetes mellitus, hypertension, altered mental  EXAM: RENAL / URINARY TRACT ULTRASOUND COMPLETE COMPARISON:  None FINDINGS: Right Kidney: Renal measurements: 10.3 x 5.6 x 5.5 cm = volume: 164 mL. Normal cortical thickness. Significantly increased cortical echogenicity. No hydronephrosis or shadowing calcification. Multiple RIGHT renal cysts, largest 2.2 cm and 2.3 cm in greatest sizes. Left Kidney: No LEFT kidney visualized, question obscured, ectopic, or surgically versus developmentally absent. Bladder: Appears normal for degree of bladder distention. Other: N/A IMPRESSION: Nonvisualization of LEFT kidney. Medical renal disease changes RIGHT kidney with multiple renal cysts. Electronically Signed   By: Lavonia Dana M.D.   On: 04/09/2021 14:39   US THORACENTESIS ASP PLEURAL SPACE W/IMG GUIDE  Result Date: 04/09/2021 INDICATION: RIGHT pleural effusion EXAM: ULTRASOUND GUIDED DIAGNOSTIC AND THERAPEUTIC RIGHT THORACENTESIS MEDICATIONS: None. COMPLICATIONS: None immediate. PROCEDURE: An ultrasound guided thoracentesis was thoroughly discussed with the patient and questions answered. The benefits, risks, alternatives and complications were also discussed. The patient understands and wishes to proceed with the procedure. Written consent was obtained. Ultrasound was performed to localize and mark an adequate pocket of fluid in the RIGHT chest. The area was then prepped and draped in the normal sterile fashion. 1% Lidocaine was used for local anesthesia. Under ultrasound guidance a 5 [place] Yueh catheter was introduced. Thoracentesis was performed. The catheter was removed and a dressing  applied. FINDINGS: A total of approximately 1 L of yellow RIGHT pleural fluid was removed. Samples were sent to the laboratory as requested by the clinical team. IMPRESSION: Successful ultrasound guided RIGHT thoracentesis yielding 1 L of pleural fluid. Electronically Signed   By: Lavonia Dana M.D.   On: 04/09/2021 14:23   Medications: Infusions:  ferric gluconate (FERRLECIT) IVPB  250 mg (04/11/21 1017)    Scheduled Medications:  amLODipine  5 mg Oral Daily   aspirin  300 mg Rectal Daily   Or   aspirin EC  325 mg Oral Daily   atorvastatin  40 mg Oral Daily   cloNIDine  0.1 mg Oral BID   furosemide  80 mg Intravenous Q12H   heparin  5,000 Units Subcutaneous Q8H   insulin aspart  0-6 Units Subcutaneous TID WC   isosorbide mononitrate  30 mg Oral Daily   metoprolol succinate  50 mg Oral Daily   mirtazapine  7.5 mg Oral QHS   pantoprazole  40 mg Oral Daily   sodium bicarbonate  650 mg Oral TID    have reviewed scheduled and prn medications.  Physical Exam:  General adult female in bed in no acute distress HEENT normocephalic atraumatic extraocular movements intact sclera anicteric Neck supple trachea midline Lungs clear but reduced on auscultation bilaterally normal work of breathing at rest  Heart S1S2 no rub Abdomen soft nontender nondistended Extremities trace edema  Neuro - 1922 oriented to person and state; calm   Claudia Desanctis, MD 04/11/2021,10:19 AM  LOS: 3 days

## 2021-04-11 NOTE — Evaluation (Signed)
Clinical/Bedside Swallow Evaluation Patient Details  Name: Tiffany Valencia MRN: [id number] Date of Birth: 1946/01/03  Today's Date: 04/11/2021 Time: SLP Start Time (ACUTE ONLY): 1300 SLP Stop Time (ACUTE ONLY): 1323 SLP Time Calculation (min) (ACUTE ONLY): 23 min  Past Medical History:  Past Medical History:  Diagnosis Date   Anemia in chronic kidney disease (CKD)    Asthma    Chronic kidney disease    Coronary artery disease    Diabetes mellitus without complication ([place])    Hypertension    Past Surgical History:  Past Surgical History:  Procedure Laterality Date   ABDOMINAL HYSTERECTOMY     BACK SURGERY     HPI:  Tiffany Valencia is a 75 year old female who presents with BLE edema, SOB, fall and confused. 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 10/3: Successful US guided R thoracentesis yielding 1 L of pleural fluid.  PMH: CKD, CAD, diabetes, HTN. BSE/SLE requested.    Assessment / Plan / Recommendation  Clinical Impression  Clinical swallow evaluation completed at bedside. Pt had passed [facility] swallow screen and was started on D2 and thins, however SLP noted oral holding of liquids and coughing at bedside, so BSE completed. Pt presents with suspected mild oropharyngeal dysphagia characterized by reduced lingual movement resulting in oral holding and labial spillage of saliva and thins, suspected delay in swallow initiation resulting in one episode of overt coughing following large sip of thin water. Pt with improved performance as trials continued, however still with oral holding. Pt denies difficulty swallowing or changes in speech (she is dysarthric). Recommend D3/mech soft and continue thin liquids via small sips with with feeder assist, po medication whole in puree. Pt may benefit from [facility] tomorrow. SLP will follow and will need SNF post discharge. SLP Visit Diagnosis: Dysphagia, oral phase (R13.11)    Aspiration  Risk  Mild aspiration risk;Risk for inadequate nutrition/hydration    Diet Recommendation Dysphagia 3 (Mech soft);Thin liquid   Liquid Administration via: Cup Medication Administration: Whole meds with puree Supervision: Staff to assist with self feeding;Full supervision/cueing for compensatory strategies Compensations: Slow rate;Small sips/bites;Monitor for anterior loss Postural Changes: Seated upright at 90 degrees;Remain upright for at least 30 minutes after po intake    Other  Recommendations Oral Care Recommendations: Oral care BID;Staff/trained caregiver to provide oral care Other Recommendations: Clarify dietary restrictions    Recommendations for follow up therapy are one component of a multi-disciplinary discharge planning process, led by the attending physician.  Recommendations may be updated based on patient status, additional functional criteria and insurance authorization.  Follow up Recommendations Skilled Nursing facility      Frequency and Duration min 2x/week  1 week       Prognosis Prognosis for Safe Diet Advancement: Fair Barriers to Reach Goals: Severity of deficits      Swallow Study   General Date of Onset: 04/08/21 HPI: Tiffany Valencia is a 75 year old female who presents with BLE edema, SOB, fall and confused. 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 10/3: Successful US guided R thoracentesis yielding 1 L of pleural fluid.  PMH: CKD, CAD, diabetes, HTN. BSE/SLE requested. Type of Study: Bedside Swallow Evaluation Previous Swallow Assessment: N/A Diet Prior to this Study: Dysphagia 2 (chopped);Thin liquids Temperature Spikes Noted: No Respiratory Status: Room air History of Recent Intubation: No Behavior/Cognition: Alert;Cooperative;Pleasant mood Oral Cavity Assessment: Within Functional Limits Oral Care Completed  by SLP: Recent completion by staff Oral Cavity - Dentition: Adequate natural  dentition Vision: Impaired for self-feeding Self-Feeding Abilities: Needs assist Patient Positioning: Upright in bed Baseline Vocal Quality: Normal (harsh) Volitional Cough: Strong Volitional Swallow: Able to elicit    Oral/Motor/Sensory Function Overall Oral Motor/Sensory Function: Moderate impairment Facial ROM: Reduced right;Reduced left Facial Symmetry: Within Functional Limits Lingual ROM: Reduced right;Reduced left;Suspected CN XII (hypoglossal) dysfunction Lingual Symmetry: Within Functional Limits Lingual Strength: Reduced Lingual Sensation: Within Functional Limits Velum:  (could not visualize)   Ice Chips Ice chips: Within functional limits Presentation: Spoon   Thin Liquid Thin Liquid: Impaired Presentation: Cup;Straw Oral Phase Impairments: Reduced lingual movement/coordination Oral Phase Functional Implications: Oral holding Pharyngeal  Phase Impairments: Suspected delayed Swallow;Throat Clearing - Immediate    Nectar Thick Nectar Thick Liquid: Impaired Presentation: Cup Oral phase functional implications: Oral holding   Honey Thick Honey Thick Liquid: Not tested   Puree Puree: Within functional limits Presentation: Spoon   Solid     Solid: Impaired Presentation: Spoon Oral Phase Impairments: Impaired mastication Oral Phase Functional Implications: Prolonged oral transit     Thank you,  [name], [place]  Idelia Caudell 04/11/2021,1:45 PM

## 2021-04-11 NOTE — Evaluation (Signed)
Occupational Therapy Evaluation Patient Details Name: Tiffany Valencia MRN: [id number] DOB: April 22, 1946 Today's Date: 04/11/2021   History of Present Illness Tiffany Valencia is a 75 year old female who presents with BLE edema, SOB, fall and confused. 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 10/3: Successful US guided R thoracentesis yielding 1 L of pleural fluid.  PMH: CKD, CAD, diabetes, HTN   Clinical Impression   Pt presents oriented to year but not place. Pt demonstrates flaccid L UE with mild A/ROM deficits in R shoulder. Pt reports blurry vision at baseline. Unable to read the board this date. L eye positioned laterally of midline with poor tracking using L eye. Pt required max A for supine to sit and sit to supine mobility with HOB elevated for supine to sit. Pt demonstrates poor sitting balance with R side lean. Pt left in bed with call bell within reach and bed alarm set. Pt will benefit from continued OT in the hospital and recommended venue below to increase strength, balance, and endurance for safe ADL's.        Recommendations for follow up therapy are one component of a multi-disciplinary discharge planning process, led by the attending physician.  Recommendations may be updated based on patient status, additional functional criteria and insurance authorization.   Follow Up Recommendations  SNF    Equipment Recommendations  None recommended by OT    Recommendations for Other Services       Precautions / Restrictions Precautions Precautions: Fall Precaution Comments: L hemiplegia Restrictions Weight Bearing Restrictions: No      Mobility Bed Mobility Overal bed mobility: Needs Assistance Bed Mobility: Supine to Sit;Sit to Supine     Supine to sit: HOB elevated;Max assist Sit to supine: Max assist   General bed mobility comments: labored movement; Pt unable to move L UE or L LE during mobility.     Transfers                      Balance Overall balance assessment: Needs assistance Sitting-balance support: Feet unsupported;Single extremity supported Sitting balance-Leahy Scale: Poor Sitting balance - Comments: seated at EOB Postural control: Right lateral lean                                 ADL either performed or assessed with clinical judgement   ADL Overall ADL's : Needs assistance/impaired                         Toilet Transfer: Maximal assistance;Total assistance;Stand-pivot Toilet Transfer Details (indicate cue type and reason): Per clinical judgment, max to total assist likely for SPT.           General ADL Comments: Pt demonstrates totally flaccid L UE. Pt demonstrates very poor balane seated at EOB.     Vision Baseline Vision/History:  (Pt reports L eye swelling at baseline which she gets injections for. Blurry vision at baseline as well.) Ability to See in Adequate Light: 1 Impaired Patient Visual Report: No change from baseline Vision Assessment?: Yes Eye Alignment: Impaired (comment) (L eye lateral of midline) Tracking/Visual Pursuits: Other (comment) (Poor tracking with L eye.) Visual Fields: No apparent deficits Additional Comments: Pt unable to read board. Pt reports blurred vision at baseline.     Perception     Praxis      Pertinent Vitals/Pain  Pain Assessment: 0-10 Pain Score: 7  Pain Location: buttocks pain Pain Descriptors / Indicators: Sore Pain Intervention(s): Limited activity within patient's tolerance;Monitored during session;Repositioned     Hand Dominance Right   Extremity/Trunk Assessment Upper Extremity Assessment Upper Extremity Assessment: RUE deficits/detail;LUE deficits/detail RUE Deficits / Details: 3-/5 shoulder ; gneralized weakness in elbow, wrist, and digits. RUE Sensation: WNL RUE Coordination: WNL LUE Deficits / Details: 0/5 at shoulder, elbow and wrist, unable to demonstrate any  muscle activation. LUE Sensation: WNL (WNL light touch per pt report.) LUE Coordination: decreased gross motor;decreased fine motor   Lower Extremity Assessment Lower Extremity Assessment: Defer to PT evaluation       Communication Communication Communication: Expressive difficulties   Cognition Arousal/Alertness: Awake/alert Behavior During Therapy: Flat affect Overall Cognitive Status: No family/caregiver present to determine baseline cognitive functioning                                 General Comments: Pt oreiented to year but not place. Pt reported prior living set up, but accuracy of information is not confirmed.   General Comments       Exercises     Shoulder Instructions      Home Living Family/patient expects to be discharged to:: Skilled nursing facility Living Arrangements: Alone Available Help at Discharge: Family;Available PRN/intermittently Type of Home: Apartment Home Access: Level entry;Elevator     Home Layout: One level     Bathroom Shower/Tub: Teacher, early years/pre: Standard Bathroom Accessibility: Yes How Accessible: Accessible via walker Home Equipment: [place] - 2 wheels;Cane - single point;Shower seat;Bedside commode;Wheelchair - manual;Grab bars - tub/shower;Grab bars - toilet   Additional Comments: Unable to confirm pt history.      Prior Functioning/Environment Level of Independence: Independent with assistive device(s)        Comments: Per pt's friend, pt was ambulating with RW or rollator in the home, able to navigate 4 STE home, toileting and dressing independently, friend assisting pt with sponge baths. (Taken from PT note.)        OT Problem List: Decreased strength;Decreased range of motion;Decreased activity tolerance;Impaired balance (sitting and/or standing);Impaired vision/perception;Decreased coordination;Decreased cognition;Impaired tone;Impaired UE functional use      OT  Treatment/Interventions: Self-care/ADL training;Therapeutic exercise;Neuromuscular education;DME and/or AE instruction;Manual therapy;Splinting;Therapeutic activities;Cognitive remediation/compensation;Visual/perceptual remediation/compensation;Patient/family education;Balance training    OT Goals(Current goals can be found in the care plan section) Acute Rehab OT Goals Patient Stated Goal: return home OT Goal Formulation: With patient Time For Goal Achievement: 04/25/21 Potential to Achieve Goals: Fair  OT Frequency: Min 2X/week    End of Session    Activity Tolerance: Patient tolerated treatment well Patient left: in bed;with call bell/phone within reach;with bed alarm set  OT Visit Diagnosis: Unsteadiness on feet (R26.81);Muscle weakness (generalized) (M62.81);Hemiplegia and hemiparesis Hemiplegia - Right/Left: Left Hemiplegia - dominant/non-dominant: Non-Dominant Hemiplegia - caused by: Cerebral infarction                [id number] OT Time Calculation (min): 26 min Charges:  OT General Charges $OT Visit: 1 Visit OT Evaluation $OT Eval Moderate Complexity: 1 Mod  Tiffany Valencia OT, MOT  [employer] 04/11/2021, 10:52 AM

## 2021-04-11 NOTE — Plan of Care (Signed)
  Problem: Acute Rehab OT Goals (only OT should resolve) Goal: Pt. Will Perform Eating Flowsheets (Taken 04/11/2021 1055) Pt Will Perform Eating:  with min assist  sitting  bed level  with adaptive utensils Goal: Pt. Will Perform Grooming Flowsheets (Taken 04/11/2021 1055) Pt Will Perform Grooming:  sitting  with min guard assist  with min assist  with adaptive equipment Goal: Pt. Will Perform Upper Body Bathing Flowsheets (Taken 04/11/2021 1055) Pt Will Perform Upper Body Bathing:  with min assist  sitting  with adaptive equipment Goal: Pt. Will Perform Lower Body Bathing Flowsheets (Taken 04/11/2021 1055) Pt Will Perform Lower Body Bathing:  with mod assist  bed level  sitting/lateral leans  with adaptive equipment Goal: Pt. Will Perform Upper Body Dressing Flowsheets (Taken 04/11/2021 1055) Pt Will Perform Upper Body Dressing:  with min assist  sitting  with adaptive equipment Goal: Pt. Will Perform Lower Body Dressing Flowsheets (Taken 04/11/2021 1055) Pt Will Perform Lower Body Dressing:  with mod assist  sitting/lateral leans  bed level  with adaptive equipment Goal: Pt. Will Transfer To Toilet [place] (Taken 04/11/2021 1055) Pt Will Transfer to Toilet:  with mod assist  stand pivot transfer  bedside commode Goal: Pt/Caregiver Will Perform Home Exercise Program Flowsheets (Taken 04/11/2021 1055) Pt/caregiver will Perform Home Exercise Program:  Increased ROM  Increased strength  Left upper extremity  With minimal assist  Right Upper extremity  Clancy Leiner OT, MOT

## 2021-04-12 DIAGNOSIS — Z515 Encounter for palliative care: Secondary | ICD-10-CM | POA: Diagnosis not present

## 2021-04-12 DIAGNOSIS — I63421 Cerebral infarction due to embolism of right anterior cerebral artery: Secondary | ICD-10-CM | POA: Diagnosis not present

## 2021-04-12 DIAGNOSIS — Z7189 Other specified counseling: Secondary | ICD-10-CM | POA: Diagnosis not present

## 2021-04-12 DIAGNOSIS — L8990 Pressure ulcer of unspecified site, unspecified stage: Secondary | ICD-10-CM | POA: Insufficient documentation

## 2021-04-12 DIAGNOSIS — J9601 Acute respiratory failure with hypoxia: Secondary | ICD-10-CM | POA: Diagnosis not present

## 2021-04-12 LAB — CBC
HCT: 28.1 % — ABNORMAL LOW (ref 36.0–46.0)
Hemoglobin: 7.9 g/dL — ABNORMAL LOW (ref 12.0–15.0)
MCH: 25.7 pg — ABNORMAL LOW (ref 26.0–34.0)
MCHC: 28.1 g/dL — ABNORMAL LOW (ref 30.0–36.0)
MCV: 91.5 fL (ref 80.0–100.0)
Platelets: 130 10*3/uL — ABNORMAL LOW (ref 150–400)
RBC: 3.07 MIL/uL — ABNORMAL LOW (ref 3.87–5.11)
RDW: 17.7 % — ABNORMAL HIGH (ref 11.5–15.5)
WBC: 4.8 10*3/uL (ref 4.0–10.5)
nRBC: 0 % (ref 0.0–0.2)

## 2021-04-12 LAB — GLUCOSE, CAPILLARY
Glucose-Capillary: 80 mg/dL (ref 70–99)
Glucose-Capillary: 85 mg/dL (ref 70–99)
Glucose-Capillary: 133 mg/dL — ABNORMAL HIGH (ref 70–99)
Glucose-Capillary: 117 mg/dL — ABNORMAL HIGH (ref 70–99)
Glucose-Capillary: 152 mg/dL — ABNORMAL HIGH (ref 70–99)

## 2021-04-12 LAB — RENAL FUNCTION PANEL
Albumin: 2.2 g/dL — ABNORMAL LOW (ref 3.5–5.0)
Anion gap: 6 (ref 5–15)
BUN: 53 mg/dL — ABNORMAL HIGH (ref 8–23)
CO2: 21 mmol/L — ABNORMAL LOW (ref 22–32)
Calcium: 7.7 mg/dL — ABNORMAL LOW (ref 8.9–10.3)
Chloride: 110 mmol/L (ref 98–111)
Creatinine, Ser: 6.75 mg/dL — ABNORMAL HIGH (ref 0.44–1.00)
GFR, Estimated: 6 mL/min — ABNORMAL LOW (ref >60)
Glucose, Bld: 90 mg/dL (ref 70–99)
Phosphorus: 5.6 mg/dL — ABNORMAL HIGH (ref 2.5–4.6)
Potassium: 4.2 mmol/L (ref 3.5–5.1)
Sodium: 137 mmol/L (ref 135–145)

## 2021-04-12 MED ORDER — HYDRALAZINE HCL 20 MG/ML IJ SOLN: 10.0000 mg | Freq: Four times a day (QID) | INTRAMUSCULAR | Status: DC | PRN

## 2021-04-12 MED ORDER — FUROSEMIDE 80 MG PO TABS
80.0000 mg | ORAL_TABLET | Freq: Every day | ORAL | Status: DC
  Administered 2021-04-12 – 2021-04-13 (×2): 80 mg via ORAL
  Filled 2021-04-12 (×2): qty 1

## 2021-04-12 MED ORDER — HYDROCERIN EX CREA
TOPICAL_CREAM | Freq: Two times a day (BID) | CUTANEOUS | Status: DC
  Filled 2021-04-12: qty 113

## 2021-04-12 MED ORDER — GERHARDT'S BUTT CREAM
TOPICAL_CREAM | Freq: Three times a day (TID) | CUTANEOUS | Status: DC
  Filled 2021-04-12: qty 1

## 2021-04-12 MED ORDER — CLONIDINE HCL 0.2 MG PO TABS: 0.2000 mg | ORAL_TABLET | Freq: Two times a day (BID) | ORAL | Status: DC

## 2021-04-12 MED ORDER — AMLODIPINE BESYLATE 5 MG PO TABS
10.0000 mg | ORAL_TABLET | Freq: Every day | ORAL | Status: DC
  Administered 2021-04-12 – 2021-04-13 (×2): 10 mg via ORAL
  Filled 2021-04-12 (×2): qty 2

## 2021-04-12 MED ORDER — CLONIDINE HCL 0.2 MG PO TABS
0.2000 mg | ORAL_TABLET | Freq: Two times a day (BID) | ORAL | Status: DC
  Administered 2021-04-12 – 2021-04-13 (×2): 0.2 mg via ORAL
  Filled 2021-04-12 (×2): qty 1

## 2021-04-12 NOTE — Progress Notes (Signed)
Speech Language Pathology Treatment: Dysphagia  Patient Details Name: Tiffany Valencia MRN: [id number] DOB: 07-01-1946 Today's Date: 04/12/2021 Time: [id number] SLP Time Calculation (min) (ACUTE ONLY): 22 min  Assessment / Plan / Recommendation Clinical Impression  Pt seen for ongoing dysphagia intervention following clinical swallow evaluation completed yesterday. Pt with limited intake per nursing staff due to distaste for items. Pt agreeable to drinking water today ("That's nasty") and exhibited oral holding but no coughing as she was yesterday. Pt declined further po trials and needed cues for alertness and attention to tasks. Continue diet as ordered and SLP will follow during acute setting and she will benefit from f/u at [facility].    HPI HPI: Tiffany Valencia is a 75 year old female who presents with BLE edema, SOB, fall and confused. 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 10/3: Successful US guided R thoracentesis yielding 1 L of pleural fluid.  PMH: CKD, CAD, diabetes, HTN. BSE/SLE requested.      SLP Plan  Continue with current plan of care  Patient needs continued Speech Lanaguage Pathology Services   Recommendations for follow up therapy are one component of a multi-disciplinary discharge planning process, led by the attending physician.  Recommendations may be updated based on patient status, additional functional criteria and insurance authorization.    Recommendations  Diet recommendations: Dysphagia 3 (mechanical soft);Thin liquid Liquids provided via: Cup;Straw Medication Administration: Whole meds with puree Supervision: Staff to assist with self feeding;Full supervision/cueing for compensatory strategies Compensations: Slow rate;Small sips/bites;Monitor for anterior loss Postural Changes and/or Swallow Maneuvers: Seated upright 90 degrees;Upright 30-60 min after meal;Out of bed for meals                Oral  Care Recommendations: Oral care BID;Staff/trained caregiver to provide oral care Follow up Recommendations: Skilled Nursing facility SLP Visit Diagnosis: Dysphagia, oral phase (R13.11) Attention and concentration deficit following: Cerebral infarction Plan: Continue with current plan of care       Thank you,  [name], [place]                 [place]  04/12/2021, 5:00 PM

## 2021-04-12 NOTE — Progress Notes (Signed)
Subjective:   strict ins/outs not available.  had 600 ml Uop over 10/5 as well as 4 unmeasured voids.   She met with palliative care who reached out to family.  She just started seeing nephrology she thinks.  She states that she does not want dialysis again today.  Year is 75.  Other notes - Recent hospital course with MRI seeming to show subacute or early subacute CVA-  not candidate for thrombolytics.  Had a thoracentesis of one liter  Review of systems: denies n/v  Denies shortness of breath or chest pain  Objective Vital signs in last 24 hours: Vitals:   04/11/21 1838 04/11/21 2122 04/12/21 0318 04/12/21 0407  BP: (!) 176/70 (!) 167/69 (!) 183/69   Pulse: 66 64 65   Resp: _0 Temp: 99.1 F (37.3 C) 98.2 F (36.8 C) 98.2 F (36.8 C)   TempSrc: Oral     SpO2: 96% 93% 98%   Weight:    86.4 kg  Height:       Weight change: -0.4 kg  Intake/Output Summary (Last 24 hours) at 04/12/2021 1020 Last data filed at 04/12/2021 0900 Gross per 24 hour  Intake 720 ml  Output 600 ml  Net 120 ml    Assessment/Plan: 75 year old BF history of many medical issues including CKD it seems-  now with failure to thrive   1.Renal- presumably has CKD since had a nephrologist prior to moving down here and is on some "nephrology type "  meds.  Crt is elevated giving poor GFR.  I asked her if her kidney doctor ever talked to her about dialysis.  "No, we didn't talk about that.  They want to put everyone on dialysis, I dont want it" There are no absolute indications for dialysis but her renal failure could certainly be playing a part in her failure to thrive.  Her BUN is not exceedingly high however so unclear how much actual immediate benefit she would get from starting HD.    - Appreciate palliative care - per our last conversation they state that her family would like to honor her wishes of not pursuing dialysis as long as the patient "fully understands" - note that the patient does not "fully  understand" or have decision-making capacity - Note that given her overall health choosing to not dialyze would seem to be a reasonable option but I think that decision would ultimately be up to family - Would continue palliative involvement  - The other issue is that she is very weak.  Not sure she could withstand the rigors of chronic dialysis therapy.  Will need to make sure everyone knows what she will be in for if dialysis is added to the mix.   - requested to obtain prior records from PCP if available   2. Hypertension/volume  - overloaded with pleural effusion.  lasix was increased to 80 mg BID IV. not hypoxic. Transition to lasix 80 mg PO daily for now.  Increase clondinine to 0.2 mg BID  3. Anemia renal failure - Iron deficiency as well.  treating with IV iron x 4 doses and s/p aranesp 200 mcg once on 10/3  4. Hypothyroid-  per primary-  seems mild   5. Metabolic acidosis - continue bicarbonate   Labs: Basic Metabolic Panel: Recent Labs  Lab 04/10/21 0659 04/11/21 0702 04/12/21 0630  NA 137 138 137  K 4.1 4.0 4.2  CL 110 111 110  CO2 21* 21* 21*  GLUCOSE 127* 111* 90  BUN 52* 55* 53*  CREATININE 6.88* 6.81* 6.75*  CALCIUM 7.8* 7.7* 7.7*  PHOS 6.5* 5.5* 5.6*   Liver Function Tests: Recent Labs  Lab 04/08/21 1135 04/09/21 0558 04/10/21 0659 04/11/21 0702 04/12/21 0630  AST 24  --   --   --   --   ALT 27  --   --   --   --   ALKPHOS 83  --   --   --   --   BILITOT 0.6  --   --   --   --   PROT 7.8  --   --   --   --   ALBUMIN 2.7*   < > 2.3* 2.1* 2.2*   < > = values in this interval not displayed.    No results for input(s): AMMONIA in the last 168 hours. CBC: Recent Labs  Lab 04/08/21 1135 04/09/21 0558 04/10/21 0659 04/11/21 0702 04/12/21 0630  WBC 5.5 4.2 4.9 5.5 4.8  NEUTROABS 3.6  --   --   --   --   HGB 8.3* 7.6* 8.0* 8.1* 7.9*  HCT 28.8* 26.2* 28.7* 27.3* 28.1*  MCV 92.6 92.3 94.1 91.6 91.5  PLT 153 123* 137* 129* 130*    CBG: Recent  Labs  Lab 04/11/21 1123 04/11/21 1700 04/11/21 2121 04/12/21 0318 04/12/21 0739  GLUCAP 123* 100* 122* 80 85    Iron Studies:  No results for input(s): IRON, TIBC, TRANSFERRIN, FERRITIN in the last 72 hours.  Studies/Results: No results found. Medications: Infusions:  ferric gluconate (FERRLECIT) IVPB 250 mg (04/11/21 1017)    Scheduled Medications:  amLODipine  10 mg Oral Daily   apixaban  5 mg Oral BID   atorvastatin  40 mg Oral Daily   cloNIDine  0.1 mg Oral BID   Gerhardt's butt cream   Topical TID   insulin aspart  0-6 Units Subcutaneous TID WC   isosorbide mononitrate  30 mg Oral Daily   metoprolol succinate  50 mg Oral Daily   mirtazapine  7.5 mg Oral QHS   pantoprazole  40 mg Oral Daily   sodium bicarbonate  650 mg Oral TID    have reviewed scheduled and prn medications.  Physical Exam:   General adult female in bed in no acute distress HEENT normocephalic atraumatic extraocular movements intact sclera anicteric Neck supple trachea midline Lungs clear but reduced on auscultation bilaterally normal work of breathing at rest  Heart S1S2 no rub Abdomen soft nontender nondistended Extremities no edema  Neuro - [id number] again oriented to person and state; calm   Claudia Desanctis, MD 04/12/2021,10:37 AM

## 2021-04-12 NOTE — Progress Notes (Signed)
Palliative: Tiffany Valencia is lying quietly in bed.  She appears acutely/chronically ill, morbidly obese, and somewhat frail.  She is unable to move her left side due to her recent stroke.   She is making better eye contact today.  She is alert and oriented x4.  She tells me her name, that we are in the hospital, that the month is October, and the year is 2022.  She is able to make her needs known.  There is no family at bedside at this time although nursing staff is at bedside giving medicines and attending needs.  We talked about her acute health concerns.  She remains agreeable to go to short-term rehab for PT/OT and recovering from stroke.  We talked about her acute health problems including, but not limited to, kidney disease, wounds on her bottom, current medication regimen.  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  She complains that she has itchy skin, and we talked about this as a side effect from her kidney disorder.  Lotion ordered.  Tiffany Valencia continues to decline hemodialysis even if she were to pass away without it.  She continues to state that she would not want life support.  She needs time to show recovery, consider her options.  She is agreeable to short-term rehab in [place] with outpatient palliative following for further goals of care/CODE STATUS discussions.  Conference with attending, bedside nursing staff, nephrology, transition of care team related to patient condition, needs, goals of care, disposition, outpatient palliative services.  Plan: Short-term rehab in [place].  Outpatient palliative services to follow.  Jehovah's Witness, declines blood products.  Declines HD, declines life support.  64 minutes Quinn Axe, NP Palliative medicine team Team phone [phone] Greater than 50% of this time was spent counseling and coordinating care related to the above assessment and plan.

## 2021-04-12 NOTE — Evaluation (Signed)
Speech Language Pathology Evaluation Patient Details Name: Tiffany Valencia MRN: [id number] DOB: 1946/05/14 Today's Date: 04/12/2021 Time: [id number] SLP Time Calculation (min) (ACUTE ONLY): 22 min  Problem List:  Patient Active Problem List   Diagnosis Date Noted   AKI (acute kidney injury) ([place])    Cerebrovascular accident (CVA) due to embolism of right anterior cerebral artery ([place])    Acute heart failure ([place]) 04/08/2021   Gait instability 04/08/2021   DM (diabetes mellitus), type 2 with peripheral vascular complications ([place]) [id number]   Benign hypertension with CKD (chronic kidney disease) stage IV ([place]) 04/08/2021   Acquired thrombophilia ([place]) 04/08/2021   Chronic anticoagulation 04/08/2021   presumed Paroxysmal atrial fibrillation 04/08/2021   Hypertensive urgency 04/08/2021   Anemia in chronic kidney disease (CKD)    Pleural effusion on right    Altered mental state    Volume overload    Metabolic acidosis    Elevated brain natriuretic peptide (BNP) level    Hypoalbuminemia    Dysarthria    Coronary artery disease s/p STENT    Acute respiratory failure with hypoxia (HCC)    Past Medical History:  Past Medical History:  Diagnosis Date   Anemia in chronic kidney disease (CKD)    Asthma    Chronic kidney disease    Coronary artery disease    Diabetes mellitus without complication ([place])    Hypertension    Past Surgical History:  Past Surgical History:  Procedure Laterality Date   ABDOMINAL HYSTERECTOMY     BACK SURGERY     HPI:  Tiffany Valencia is a 75 year old female who presents with BLE edema, SOB, fall and confused. 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 10/3: Successful US guided R thoracentesis yielding 1 L of pleural fluid.  PMH: CKD, CAD, diabetes, HTN. BSE/SLE requested.   Assessment / Plan / Recommendation Clinical Impression  Pt presents with moderate cognitive linguistic deficit  characterized by impaired attention, memory, dysarthria (poor breath support and reduced speech intelligibility), and awarness into deficits. Pt was oriented when allowed extra time to answer and stated she would be going to "rehab". She states that she does not have difficulty with swallowing her speech intelligibility, however required moderate cues to "swallow" due to oral holding of liquids. SLP had to request clarification for impaired speech intelligibility frequently and Pt with poor attempts to over articulate or use strategies introduced by SLP. Pt also appears lethargic at this time and RN reports that she was more alert earlier today and staff was able to understand her. Recommend f/u SLP services at next venue to further evaluate cognitive linguistic skills and treat current deficits.    SLP Assessment  SLP Recommendation/Assessment: Patient needs continued Speech Lanaguage Pathology Services SLP Visit Diagnosis: Dysarthria and anarthria (R47.1);Attention and concentration deficit;Cognitive communication deficit (R41.841) Attention and concentration deficit following: Cerebral infarction    Recommendations for follow up therapy are one component of a multi-disciplinary discharge planning process, led by the attending physician.  Recommendations may be updated based on patient status, additional functional criteria and insurance authorization.    Follow Up Recommendations  Skilled Nursing facility    Frequency and Duration min 2x/week  1 week      SLP Evaluation Cognition  Overall Cognitive Status: No family/caregiver present to determine baseline cognitive functioning Arousal/Alertness: Awake/alert Orientation Level: Oriented X4 Year: 2022 ("[birth year]22" but stated she was born in 1947, corrected to 2022) Month: October Day of Week: Correct  Attention: Sustained Sustained Attention: Impaired Sustained Attention Impairment: Verbal complex Memory: Impaired Awareness: Impaired Awareness  Impairment: Emergent impairment Problem Solving: Appears intact Behaviors: Restless Safety/Judgment: Impaired (regarding swallowing -orally holding liquids at times)       Comprehension  Auditory Comprehension Overall Auditory Comprehension: Impaired Yes/No Questions: Within Functional Limits Commands: Impaired One Step Basic Commands: 75-100% accurate Two Step Basic Commands: 50-74% accurate Conversation: Simple Interfering Components: Attention;Visual impairments;Processing speed EffectiveTechniques: Extra processing time;Increased volume;Repetition Visual Recognition/Discrimination Discrimination: Not tested Reading Comprehension Reading Status: Not tested    Expression Expression Primary Mode of Expression: Verbal Verbal Expression Overall Verbal Expression: Appears within functional limits for tasks assessed Initiation: No impairment Automatic Speech: Name;Social Response Level of Generative/Spontaneous Verbalization: Phrase Repetition: Impaired Level of Impairment: Sentence level Naming: No impairment Pragmatics: No impairment Interfering Components: Speech intelligibility Non-Verbal Means of Communication: Not applicable Written Expression Dominant Hand: Right Written Expression: Not tested   Oral / Motor  Oral Motor/Sensory Function Overall Oral Motor/Sensory Function: Moderate impairment Facial ROM: Reduced right;Reduced left Facial Symmetry: Within Functional Limits Facial Strength: Reduced right;Reduced left Facial Sensation: Within Functional Limits Lingual ROM: Reduced right;Reduced left;Suspected CN XII (hypoglossal) dysfunction Lingual Symmetry: Within Functional Limits Lingual Strength: Reduced Lingual Sensation: Within Functional Limits Mandible: Impaired Motor Speech Overall Motor Speech: Impaired Respiration: Impaired Level of Impairment: Phrase Phonation: Normal (harsh) Resonance: Within functional limits Articulation: Impaired Level of  Impairment: Phrase Intelligibility: Intelligibility reduced Word: 50-74% accurate Phrase: 50-74% accurate Motor Planning: Witnin functional limits Motor Speech Errors: Not applicable Effective Techniques: Over-articulate (unable/willing to try)   Thank you,  [name], [place]                     Delmar Dondero 04/12/2021, 4:58 PM

## 2021-04-12 NOTE — Progress Notes (Signed)
Physical Therapy Treatment Patient Details Name: Tiffany Valencia MRN: [id number] DOB: 10-30-1945 Today's Date: 04/12/2021   History of Present Illness Tiffany Valencia is a 75 year old female who presents with BLE edema, SOB, fall and confused. 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 10/3: Successful US guided R thoracentesis yielding 1 L of pleural fluid.  PMH: CKD, CAD, diabetes, HTN    PT Comments    Patient presents alert and agreeable for therapy. Patient demonstrates slow labored movement for sitting up at bedside with fair/poor return for attempting to prop up on right elbow mostly due to weakness, once seated frequent leaning/falling backwards and to the right, has to prop self up on RUE to maintain sitting balance for up to 2-3 minutes.  Patient can maintain trunk in midline without using RUE for up to 5 - 10 seconds before losing balance and unable to attempt sit to stands or transfers due to weakness.  Patient will benefit from continued physical therapy in hospital and recommended venue below to increase strength, balance, endurance for safe ADLs and gait.    Recommendations for follow up therapy are one component of a multi-disciplinary discharge planning process, led by the attending physician.  Recommendations may be updated based on patient status, additional functional criteria and insurance authorization.  Follow Up Recommendations  SNF     Equipment Recommendations  None recommended by PT    Recommendations for Other Services       Precautions / Restrictions Precautions Precautions: Fall Restrictions Weight Bearing Restrictions: No     Mobility  Bed Mobility Overal bed mobility: Needs Assistance Bed Mobility: Rolling;Supine to Sit;Sit to Supine Rolling: Max assist   Supine to sit: Max assist Sit to supine: Max assist   General bed mobility comments: slow labored movement with poor carryover for  propping up on right elbow due to weakness    Transfers                    Ambulation/Gait                 Stairs             Wheelchair Mobility    Modified Rankin (Stroke Patients Only)       Balance Overall balance assessment: Needs assistance Sitting-balance support: Feet supported;Single extremity supported Sitting balance-Leahy Scale: Poor Sitting balance - Comments: fair/poor leaning on RUE, poor without BUE support Postural control: Posterior lean;Right lateral lean                                  Cognition Arousal/Alertness: Awake/alert Behavior During Therapy: WFL for tasks assessed/performed Overall Cognitive Status: Within Functional Limits for tasks assessed                                        Exercises      General Comments        Pertinent Vitals/Pain Pain Assessment: Faces Faces Pain Scale: Hurts little more Pain Location: buttocks when sitting Pain Descriptors / Indicators: Sore Pain Intervention(s): Limited activity within patient's tolerance;Monitored during session;Repositioned    Home Living                      Prior Function  PT Goals (current goals can now be found in the care plan section) Acute Rehab PT Goals Patient Stated Goal: return home PT Goal Formulation: With patient Time For Goal Achievement: 04/24/21 Potential to Achieve Goals: Fair Progress towards PT goals: Progressing toward goals    Frequency    Min 3X/week      PT Plan Current plan remains appropriate    Co-evaluation              AM-PAC PT "6 Clicks" Mobility   Outcome Measure  Help needed turning from your back to your side while in a flat bed without using bedrails?: A Lot Help needed moving from lying on your back to sitting on the side of a flat bed without using bedrails?: A Lot Help needed moving to and from a bed to a chair (including a wheelchair)?: Total Help  needed standing up from a chair using your arms (e.g., wheelchair or bedside chair)?: Total Help needed to walk in hospital room?: Total Help needed climbing 3-5 steps with a railing? : Total 6 Click Score: 8    End of Session   Activity Tolerance: Patient tolerated treatment well;Patient limited by fatigue Patient left: in bed;with call bell/phone within reach Nurse Communication: Mobility status PT Visit Diagnosis: Other abnormalities of gait and mobility (R26.89);Muscle weakness (generalized) (M62.81);Other symptoms and signs involving the nervous system (R29.898);Hemiplegia and hemiparesis Hemiplegia - Right/Left: Left Hemiplegia - caused by: Cerebral infarction     Time: 1535-1555 PT Time Calculation (min) (ACUTE ONLY): 20 min  Charges:  $Therapeutic Activity: 8-22 mins                     4:10 PM, 04/12/21 Tiffany Valencia, MPT Physical Therapist with [facility] 336 [phone] office [phone] mobile phone

## 2021-04-12 NOTE — TOC Progression Note (Signed)
Transition of Care [facility]) - Progression Note    Patient Details  Name: Tiffany Valencia MRN: [id number] Date of Birth: Mar 06, 1946  Transition of Care [facility] CM/SW Contact  Shade Flood, LCSW Phone Number: 04/12/2021, 10:23 AM  Clinical Narrative:     TOC following. Per MD, pt not yet medially ready for dc. MD states 2-3 days. Updated admissions at [facility]. They do accept weekend admissions. Pt will need covid test within 24-48 hours of dc.  TOC will follow.  Expected Discharge Plan: [place] Barriers to Discharge: Continued Medical Work up  Expected Discharge Plan and Services Expected Discharge Plan: [place] In-house Referral: Clinical Social Work   Post Acute Care Choice: [place] arrangements for the past 2 months: Single Family Home                 DME Arranged: N/A                     Social Determinants of Health (SDOH) Interventions    Readmission Risk Interventions No flowsheet data found.

## 2021-04-12 NOTE — Progress Notes (Signed)
Patient Demographics:    Tiffany Valencia, is a 75 y.o. female, DOB - May 04, 1946, [id number]  Admit date - 04/08/2021   Admitting Physician Murlean Iba, MD  Outpatient Primary MD for the patient is Pcp, No  LOS - 4   Chief Complaint  Patient presents with   Edema        Subjective:    Tiffany Valencia today has no fevers, no emesis,  No chest pain,  - -A bit more coherent today able to answer questions -Oral intake is fair but not great  Assessment  & Plan :    Principal Problem:   Acute respiratory failure with hypoxia ([place]) Active Problems:   Acute heart failure (HCC)   Gait instability   DM (diabetes mellitus), type 2 with peripheral vascular complications (HCC)   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)   Anemia in chronic kidney disease (CKD)   Pleural effusion on right   Altered mental state   Volume overload   Metabolic acidosis   Elevated brain natriuretic peptide (BNP) level   Hypoalbuminemia   Dysarthria   Coronary artery disease s/p STENT   Acquired thrombophilia (HCC)   Chronic anticoagulation   presumed Paroxysmal atrial fibrillation   Hypertensive urgency   AKI (acute kidney injury) ([place])   Cerebrovascular accident (CVA) due to embolism of right anterior cerebral artery ([place])   Pressure injury of skin  Brief Summary:-  Tiffany Valencia is a 75 year old female recently brought to the area with family. Family report the patient was unable to care for herself in [place], brought her to [facility] for the past month. Reported hx includes:cad with stent 2013, htn, hld, ckd, afib, dm, and obesity, and CVA. No medical records were available from [place]/her previous providers. On 10/2 she was noted to be slow to respond, increased edema, was brought to the ED. Noted hypertension and edema, pleural effusion. 10/3 the patient was noted  to be altered upon morning rounds, code stroke was called, confirmed by ct and mri. Left extremity weakness remains, permissive hypertension remains, soon to restart her antihypertensives and apixaban.   A/p 1)ACUTE Cerebrovascular accident (CVA) due to embolism of right anterior cerebral artery on 04/09/21 --MR brain with MR head and neck angiography -not a candidate for thrombolytics per neurology given patient was on apixaban and last known normal not clear  -Neurology recommended RESTARTING apixaban on 04/12/21 -Aspirin, discontinued after 04/11/2021   2)ACUTE Cerebrovascular accident (CVA) due to embolism of right anterior cerebral artery  -code stroke with head ct on10/3/22. -conducted full stroke work up  -MR brain with MR head and neck angiography -not a candidate for thrombolytics per neurology given patient was on apixaban and last known normal not clear  -Inpatient neurology consultation requested -Neurology recommended RESTARTING apixaban on 04/12/2021 -Awaiting SNF rehab at this time     2)Acute diastolic heart failure -Echo from 04/09/2021 with EF of 60 to 65% and grade 2 diastolic dysfunction -Echo from 04/26/2021 also shows severe concentric LVH and interventricular septum is flattened in systole and diastolic consistent with right ventricular pressure and volume overload -Continue IV Lasix 40 mg BID for diuresis -Monitor weight and output and input closely    3)Acute respiratory  failure with hypoxia -Secondary to heart failure exacerbation volume overload and large right-sided pleural effusion-see echo report above #2 -- Rt sided ultrasound thoracentesis done 04/09/21 -  -Continue IV diuresis   4) DM2 (diabetes mellitus) controlled with renal complications -[id number] is 5.9 reflecting excellent diabetic control PTA -Use Novolog/Humalog Sliding scale insulin with Accu-Cheks/Fingersticks as ordered     5) Benign hypertension  -amlodipine, clonidine, permissive hypertension x 72  hours in setting of acute CVA -reducing metoprolol and imdur with holding parameters -hydralazine iv for sbp>170    6) CKD (chronic kidney disease) stage IV -daily renal function labs -electrolyte correction as needed -appreciate nephrology consultation and recommendations -no indication for dialysis at this time -three times daily sodium bicarb tablet     7)Anemia in chronic kidney disease (CKD) -iron supplementation IV x 3 doses per nephrology team  8)  Coronary artery disease s/p STENT -continue home statin, imdur   9)  Paroxysmal Atrial fibrillation/Acquired thrombophilia / Chronic anticoagulation -ASA per neurology for now in setting of acute CVA  -Restart apixaban on 04/12/2021 please see #1 above  10)Social/Ethics--- palliative care consult requested to help delineate goals of care, and to also help delineate decision makers for patient as patient is currently not able to make informed decisions -As per palliative care provider patient has requested-full CODE STATUS, outpatient palliative services to follow.  J[employer]ovah[employer] Witness, declines blood products.  Declines HD, declines life support. -Patient's family in agreement with above request -Please see palliative care provider note dated 04/12/2021  11)FEN--speech therapist recommends Dysphagia 3 (mechanical soft);Thin liquid   DVT prophylaxis:   restart apixaban on 04/12/21 Code Status: full Family Communication: t/c to daughter w/ pt permission Disposition Plan: anticipating SNF placement      Consultants:  Neurology Nephrology Palliative care    Procedures:  -Ct head, code stroke -MR Angio head and neck wo contrast -MR brain wo contrast -US thoracentesis -US renal   Disposition/Need for in-Hospital Stay- patient unable to be discharged at this time due to --acute stroke awaiting transfer to SNF rehab possibly in [place] is: Inpatient  Remains inpatient appropriate because: Please see  disposition above  Disposition: The patient is from: Home              Anticipated d/c is to: SNF              Anticipated d/c date is: 2 days              Patient currently is not medically stable to d/c. Barriers: Not Clinically Stable-   Code Status :  -  Code Status: Full Code   Family Communication:   -Sister updated  Consults  :  Neuro/Palliative  DVT Prophylaxis  :   - SCDs  Place TED hose Start: 04/08/21 1409 apixaban (ELIQUIS) tablet 5 mg    Lab Results  Component Value Date   PLT 130 (L) 04/12/2021    Inpatient Medications  Scheduled Meds:  amLODipine  10 mg Oral Daily   apixaban  5 mg Oral BID   atorvastatin  40 mg Oral Daily   cloNIDine  0.2 mg Oral BID   furosemide  80 mg Oral Daily   Gerhardt's butt cream   Topical TID   hydrocerin   Topical BID   insulin aspart  0-6 Units Subcutaneous TID WC   isosorbide mononitrate  30 mg Oral Daily   metoprolol succinate  50 mg Oral Daily   mirtazapine  7.5 mg Oral  QHS   pantoprazole  40 mg Oral Daily   sodium bicarbonate  650 mg Oral TID   Continuous Infusions:   PRN Meds:.acetaminophen **OR** acetaminophen, albuterol, bisacodyl, hydrALAZINE, ondansetron **OR** ondansetron (ZOFRAN) IV, oxyCODONE, traZODone    Anti-infectives (From admission, onward)    None         Objective:   Vitals:   04/12/21 0318 04/12/21 0407 04/12/21 1037 04/12/21 1347  BP: (!) 183/69  (!) 194/75 (!) 162/77  Pulse: 65  66 68  Resp: '20  18 16  '$ Temp: 98.2 F (36.8 C)  (!) 97.5 F (36.4 C) 98.9 F (37.2 C)  TempSrc:   Oral   SpO2: 98%  93% 95%  Weight:  86.4 kg    Height:        Wt Readings from Last 3 Encounters:  04/12/21 86.4 kg     Intake/Output Summary (Last 24 hours) at 04/12/2021 1853 Last data filed at 04/12/2021 1829 Gross per 24 hour  Intake 660 ml  Output 1150 ml  Net -490 ml     Physical Exam  Gen:- Awake Alert, less confused, HEENT:- [place].AT, No sclera icterus Neck-Supple Neck,No JVD,.  Lungs-   CTAB , fair symmetrical air movement CV- S1, S2 normal, irregular  Abd-  +ve B.Sounds, Abd Soft, No tenderness,    Extremity/Skin:- No  edema, pedal pulses present  Psych-disorientation, confusion, poor insight  neuro-generalized weakness, no new focal deficits, no tremors   Data Review:   Micro Results Recent Results (from the past 240 hour(s))  Resp Panel by RT-PCR (Flu A&B, Covid) Nasopharyngeal Swab     Status: None   Collection Time: 04/08/21 10:40 AM   Specimen: Nasopharyngeal Swab; Nasopharyngeal(NP) swabs in vial transport medium  Result Value Ref Range Status   SARS Coronavirus 2 by RT PCR NEGATIVE NEGATIVE Final    Comment: (NOTE) SARS-CoV-2 target nucleic acids are NOT DETECTED.  The SARS-CoV-2 RNA is generally detectable in upper respiratory specimens during the acute phase of infection. The lowest concentration of SARS-CoV-2 viral copies this assay can detect is 138 copies/mL. A negative result does not preclude SARS-Cov-2 infection and should not be used as the sole basis for treatment or other patient management decisions. A negative result may occur with  improper specimen collection/handling, submission of specimen other than nasopharyngeal swab, presence of viral mutation(s) within the areas targeted by this assay, and inadequate number of viral copies(<138 copies/mL). A negative result must be combined with clinical observations, patient history, and epidemiological information. The expected result is Negative.  Fact Sheet for Patients:  EntrepreneurPulse.com.au  Fact Sheet for Healthcare Providers:  IncredibleEmployment.be  This test is no t yet approved or cleared by the [place] FDA and  has been authorized for detection and/or diagnosis of SARS-CoV-2 by FDA under an Emergency Use Authorization (EUA). This EUA will remain  in effect (meaning this test can be used) for the duration of the COVID-19 declaration under  Section 564(b)(1) of the Act, 21 U.S.C.section 360bbb-3(b)(1), unless the authorization is terminated  or revoked sooner.       Influenza A by PCR NEGATIVE NEGATIVE Final   Influenza B by PCR NEGATIVE NEGATIVE Final    Comment: (NOTE) The Xpert Xpress SARS-CoV-2/FLU/RSV plus assay is intended as an aid in the diagnosis of influenza from Nasopharyngeal swab specimens and should not be used as a sole basis for treatment. Nasal washings and aspirates are unacceptable for Xpert Xpress SARS-CoV-2/FLU/RSV testing.  Fact Sheet for Patients: EntrepreneurPulse.com.au  Fact  Sheet for Healthcare Providers: IncredibleEmployment.be  This test is not yet approved or cleared by the [place] and has been authorized for detection and/or diagnosis of SARS-CoV-2 by FDA under an Emergency Use Authorization (EUA). This EUA will remain in effect (meaning this test can be used) for the duration of the COVID-19 declaration under Section 564(b)(1) of the Act, 21 U.S.C. section 360bbb-3(b)(1), unless the authorization is terminated or revoked.  Performed at [facility], [address] [place], [place] [address]   Culture, body fluid w Gram Stain-bottle     Status: None (Preliminary result)   Collection Time: 04/09/21  1:45 PM   Specimen: Pleura  Result Value Ref Range Status   Specimen Description PLEURAL RIGHT  Final   Special Requests NONE  Final   Culture   Final    NO GROWTH 3 DAYS Performed at [facility], [address], [place], [place] [address]    Report Status PENDING  Incomplete  Gram stain     Status: None   Collection Time: 04/09/21  1:45 PM   Specimen: Pleura  Result Value Ref Range Status   Specimen Description PLEURAL RIGHT  Final   Special Requests NONE  Final   Gram Stain   Final    NO ORGANISMS SEEN WBC SEEN CYTOSPIN SMEAR Performed at [facility], [address] [place], [place] [address]    Report Status 04/09/2021 FINAL   Final    Radiology Reports DG Chest 1 View  Result Date: 04/09/2021 CLINICAL DATA:  RIGHT pleural effusion post thoracentesis EXAM: CHEST  1 VIEW COMPARISON:  Exam at 1341 hours compared to 0451 hours FINDINGS: Resolution of RIGHT pleural effusion. Minimal residual bibasilar atelectasis. No pneumothorax. Enlargement of cardiac silhouette persists. Atherosclerotic calcification aorta. IMPRESSION: No pneumothorax following RIGHT thoracentesis. Minimal bibasilar atelectasis. Electronically Signed   By: Lavonia Dana M.D.   On: 04/09/2021 14:37   DG Chest 1 View  Result Date: 04/08/2021 CLINICAL DATA:  [place] this morning. Low O2 sats. Confusion for 2 days. EXAM: CHEST  1 VIEW COMPARISON:  None. FINDINGS: Heart is enlarged but also accentuated by technique. Moderate RIGHT pleural effusion is noted, with opacification of the RIGHT lung base which obscures the RIGHT hemidiaphragm. Pulmonary vascular congestion is present. LEFT lung is otherwise clear. No pneumothorax. No acute displaced fractures. IMPRESSION: Cardiomegaly and pulmonary vascular congestion. RIGHT pleural effusion and RIGHT LOWER lobe opacity consistent with atelectasis or infiltrate. Electronically Signed   By: Nolon Nations M.D.   On: 04/08/2021 12:30   CT Head Wo Contrast  Result Date: 04/08/2021 CLINICAL DATA:  Mental status change. EXAM: CT HEAD WITHOUT CONTRAST TECHNIQUE: Contiguous axial images were obtained from the base of the skull through the vertex without intravenous contrast. COMPARISON:  None. FINDINGS: Brain: Ventricles and sulci are prominent compatible with atrophy. Periventricular and subcortical white matter hypodensities compatible with chronic microvascular ischemic changes. Low-attenuation throughout the majority of the left cerebellar hemisphere. 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 No evidence for acute intracranial hemorrhage or significant mass effect. Vascular: Unremarkable. Skull: Intact.  Sinuses/Orbits: Paranasal sinuses are well aerated. Mastoid air cells are unremarkable. Other: None. IMPRESSION: Low-attenuation within the majority of the left cerebellar hemisphere may represent subacute to chronic infarct, in the absence of priors. Consider further evaluation with MRI as clinically indicated. Atrophy and chronic microvascular ischemic changes. Electronically Signed   By: Lovey Newcomer M.D.   On: 04/08/2021 12:13   MR ANGIO HEAD WO CONTRAST  Result Date:  04/09/2021 CLINICAL DATA:  Altered mental status, does not communicate EXAM: MRI HEAD WITHOUT CONTRAST MRA HEAD WITHOUT CONTRAST MRA NECK WITHOUT CONTRAST TECHNIQUE: Multiplanar, multiecho pulse sequences of the brain and surrounding structures were obtained without intravenous contrast. Angiographic images of the Circle of Willis were obtained using MRA technique without intravenous contrast. Angiographic images of the neck were obtained using MRA technique without intravenous contrast. Carotid stenosis measurements (when applicable) are obtained utilizing NASCET criteria, using the distal internal carotid diameter as the denominator. COMPARISON:  Same-day noncontrast CT head FINDINGS: Image quality is significantly degraded by motion artifact. MRI HEAD FINDINGS Brain: There is diffusion restriction in the right ACA distribution involving the right aspect of the genu of the corpus callosum and right parasagittal frontal lobe. The FLAIR sequences markedly motion degraded, but there appears to be associated FLAIR signal abnormality. Findings consistent with evolving early subacute infarct. Susceptibility artifact along the falx on the T2* sequence is likely due to bulky dural calcifications seen on the same-day head CT. There is no convincing evidence of hemorrhagic transformation. There is a large remote infarct in the left cerebellar hemisphere and a small remote infarct in the right cerebellar hemisphere. There is a background of  mild-to-moderate global parenchymal volume loss with enlargement of the ventricular system and extra-axial CSF spaces. Additional foci of T2/FLAIR signal abnormality in the subcortical and periventricular white matter likely reflects sequela of chronic white matter microangiopathy. Vascular: The major intracranial flow voids are present. Skull and upper cervical spine: Normal marrow signal. Sinuses/Orbits: The imaged paranasal sinuses are clear. Bilateral lens implants are in place. The globes and orbits are otherwise unremarkable. Other: There is a left mastoid effusion. MRA HEAD FINDINGS Evaluation of the intracranial vasculature is markedly degraded by motion artifact. Anterior circulation: The right intracranial ICA appears patent. Evaluation of the left intracranial ICA is markedly degraded, but appears to be patent. The bilateral M1 segments are patent. The distal branches appear overall patent, but are not well evaluated. The proximal anterior cerebral arteries are patent. The distal anterior cerebral arteries are not well evaluated. Anterior circulation aneurysm can not be excluded. Posterior circulation: The V4 segments of the vertebral arteries are patent. The basilar artery is patent. The proximal PCAs are patent. The distal branches are not well evaluated. Posterior circulation aneurysms can not be excluded. Anatomic variants: The posterior communicating arteries are not definitively identified. MRA NECK FINDINGS Evaluation of the vasculature of the neck is markedly degraded by motion artifact. Aortic arch: Not evaluated. Right carotid System: Time-of-flight MRA demonstrates antegrade flow in the right carotid system. Stenosis or dissection can not be evaluated. Left carotid System: Time-of-flight MRA demonstrates antegrade flow in the left carotid system. Stenosis or dissection can not be evaluated. Vertebral arteries: Time-of-flight MRA demonstrates antegrade flow in the vertebral arteries. Stenosis or  dissection can not be evaluated. IMPRESSION: 1. Markedly motion degraded study with essentially nondiagnostic MRA of the head and neck. 2. Early subacute infarct in the right ACA distribution without definite evidence of hemorrhagic transformation. 3. The intracranial vasculature is markedly suboptimally evaluated. The proximal vessels appear grossly patent. The distal vasculature is not evaluated. 4. Time-of-flight MRA demonstrates antegrade flow in the carotid systems and vertebral arteries. Stenoses or dissection can not be evaluated. 5. 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 6. Consider CTA of the head/neck for evaluation of the vasculature as indicated given reduced susceptibility to motion artifact. Electronically Signed   By: Court Joy.D.  On: 04/09/2021 11:08   MR ANGIO NECK WO CONTRAST  Result Date: 04/09/2021 CLINICAL DATA:  Altered mental status, does not communicate EXAM: MRI HEAD WITHOUT CONTRAST MRA HEAD WITHOUT CONTRAST MRA NECK WITHOUT CONTRAST TECHNIQUE: Multiplanar, multiecho pulse sequences of the brain and surrounding structures were obtained without intravenous contrast. Angiographic images of the Circle of Willis were obtained using MRA technique without intravenous contrast. Angiographic images of the neck were obtained using MRA technique without intravenous contrast. Carotid stenosis measurements (when applicable) are obtained utilizing NASCET criteria, using the distal internal carotid diameter as the denominator. COMPARISON:  Same-day noncontrast CT head FINDINGS: Image quality is significantly degraded by motion artifact. MRI HEAD FINDINGS Brain: There is diffusion restriction in the right ACA distribution involving the right aspect of the genu of the corpus callosum and right parasagittal frontal lobe. The FLAIR sequences markedly motion degraded, but there appears to be associated FLAIR signal abnormality. Findings consistent with  evolving early subacute infarct. Susceptibility artifact along the falx on the T2* sequence is likely due to bulky dural calcifications seen on the same-day head CT. There is no convincing evidence of hemorrhagic transformation. There is a large remote infarct in the left cerebellar hemisphere and a small remote infarct in the right cerebellar hemisphere. There is a background of mild-to-moderate global parenchymal volume loss with enlargement of the ventricular system and extra-axial CSF spaces. Additional foci of T2/FLAIR signal abnormality in the subcortical and periventricular white matter likely reflects sequela of chronic white matter microangiopathy. Vascular: The major intracranial flow voids are present. Skull and upper cervical spine: Normal marrow signal. Sinuses/Orbits: The imaged paranasal sinuses are clear. Bilateral lens implants are in place. The globes and orbits are otherwise unremarkable. Other: There is a left mastoid effusion. MRA HEAD FINDINGS Evaluation of the intracranial vasculature is markedly degraded by motion artifact. Anterior circulation: The right intracranial ICA appears patent. Evaluation of the left intracranial ICA is markedly degraded, but appears to be patent. The bilateral M1 segments are patent. The distal branches appear overall patent, but are not well evaluated. The proximal anterior cerebral arteries are patent. The distal anterior cerebral arteries are not well evaluated. Anterior circulation aneurysm can not be excluded. Posterior circulation: The V4 segments of the vertebral arteries are patent. The basilar artery is patent. The proximal PCAs are patent. The distal branches are not well evaluated. Posterior circulation aneurysms can not be excluded. Anatomic variants: The posterior communicating arteries are not definitively identified. MRA NECK FINDINGS Evaluation of the vasculature of the neck is markedly degraded by motion artifact. Aortic arch: Not evaluated. Right  carotid System: Time-of-flight MRA demonstrates antegrade flow in the right carotid system. Stenosis or dissection can not be evaluated. Left carotid System: Time-of-flight MRA demonstrates antegrade flow in the left carotid system. Stenosis or dissection can not be evaluated. Vertebral arteries: Time-of-flight MRA demonstrates antegrade flow in the vertebral arteries. Stenosis or dissection can not be evaluated. IMPRESSION: 1. Markedly motion degraded study with essentially nondiagnostic MRA of the head and neck. 2. Early subacute infarct in the right ACA distribution without definite evidence of hemorrhagic transformation. 3. The intracranial vasculature is markedly suboptimally evaluated. The proximal vessels appear grossly patent. The distal vasculature is not evaluated. 4. Time-of-flight MRA demonstrates antegrade flow in the carotid systems and vertebral arteries. Stenoses or dissection can not be evaluated. 5. 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 6. Consider CTA of the head/neck for evaluation of the vasculature as indicated given reduced susceptibility to  motion artifact. Electronically Signed   By: Valetta Mole M.D.   On: 04/09/2021 11:08   MR BRAIN WO CONTRAST  Result Date: 04/09/2021 CLINICAL DATA:  Altered mental status, does not communicate EXAM: MRI HEAD WITHOUT CONTRAST MRA HEAD WITHOUT CONTRAST MRA NECK WITHOUT CONTRAST TECHNIQUE: Multiplanar, multiecho pulse sequences of the brain and surrounding structures were obtained without intravenous contrast. Angiographic images of the Circle of Willis were obtained using MRA technique without intravenous contrast. Angiographic images of the neck were obtained using MRA technique without intravenous contrast. Carotid stenosis measurements (when applicable) are obtained utilizing NASCET criteria, using the distal internal carotid diameter as the denominator. COMPARISON:  Same-day noncontrast CT head FINDINGS:  Image quality is significantly degraded by motion artifact. MRI HEAD FINDINGS Brain: There is diffusion restriction in the right ACA distribution involving the right aspect of the genu of the corpus callosum and right parasagittal frontal lobe. The FLAIR sequences markedly motion degraded, but there appears to be associated FLAIR signal abnormality. Findings consistent with evolving early subacute infarct. Susceptibility artifact along the falx on the T2* sequence is likely due to bulky dural calcifications seen on the same-day head CT. There is no convincing evidence of hemorrhagic transformation. There is a large remote infarct in the left cerebellar hemisphere and a small remote infarct in the right cerebellar hemisphere. There is a background of mild-to-moderate global parenchymal volume loss with enlargement of the ventricular system and extra-axial CSF spaces. Additional foci of T2/FLAIR signal abnormality in the subcortical and periventricular white matter likely reflects sequela of chronic white matter microangiopathy. Vascular: The major intracranial flow voids are present. Skull and upper cervical spine: Normal marrow signal. Sinuses/Orbits: The imaged paranasal sinuses are clear. Bilateral lens implants are in place. The globes and orbits are otherwise unremarkable. Other: There is a left mastoid effusion. MRA HEAD FINDINGS Evaluation of the intracranial vasculature is markedly degraded by motion artifact. Anterior circulation: The right intracranial ICA appears patent. Evaluation of the left intracranial ICA is markedly degraded, but appears to be patent. The bilateral M1 segments are patent. The distal branches appear overall patent, but are not well evaluated. The proximal anterior cerebral arteries are patent. The distal anterior cerebral arteries are not well evaluated. Anterior circulation aneurysm can not be excluded. Posterior circulation: The V4 segments of the vertebral arteries are patent. The  basilar artery is patent. The proximal PCAs are patent. The distal branches are not well evaluated. Posterior circulation aneurysms can not be excluded. Anatomic variants: The posterior communicating arteries are not definitively identified. MRA NECK FINDINGS Evaluation of the vasculature of the neck is markedly degraded by motion artifact. Aortic arch: Not evaluated. Right carotid System: Time-of-flight MRA demonstrates antegrade flow in the right carotid system. Stenosis or dissection can not be evaluated. Left carotid System: Time-of-flight MRA demonstrates antegrade flow in the left carotid system. Stenosis or dissection can not be evaluated. Vertebral arteries: Time-of-flight MRA demonstrates antegrade flow in the vertebral arteries. Stenosis or dissection can not be evaluated. IMPRESSION: 1. Markedly motion degraded study with essentially nondiagnostic MRA of the head and neck. 2. Early subacute infarct in the right ACA distribution without definite evidence of hemorrhagic transformation. 3. The intracranial vasculature is markedly suboptimally evaluated. The proximal vessels appear grossly patent. The distal vasculature is not evaluated. 4. Time-of-flight MRA demonstrates antegrade flow in the carotid systems and vertebral arteries. Stenoses or dissection can not be evaluated. 5. 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 6. Consider CTA of the  head/neck for evaluation of the vasculature as indicated given reduced susceptibility to motion artifact. Electronically Signed   By: Valetta Mole M.D.   On: 04/09/2021 11:08   US RENAL  Result Date: 04/09/2021 CLINICAL DATA:  Pulmonary edema, pleural effusion, diabetes mellitus, hypertension, altered mental EXAM: RENAL / URINARY TRACT ULTRASOUND COMPLETE COMPARISON:  None FINDINGS: Right Kidney: Renal measurements: 10.3 x 5.6 x 5.5 cm = volume: 164 mL. Normal cortical thickness. Significantly increased cortical echogenicity. No  hydronephrosis or shadowing calcification. Multiple RIGHT renal cysts, largest 2.2 cm and 2.3 cm in greatest sizes. Left Kidney: No LEFT kidney visualized, question obscured, ectopic, or surgically versus developmentally absent. Bladder: Appears normal for degree of bladder distention. Other: N/A IMPRESSION: Nonvisualization of LEFT kidney. Medical renal disease changes RIGHT kidney with multiple renal cysts. Electronically Signed   By: Lavonia Dana M.D.   On: 04/09/2021 14:39   DG Chest Port 1 View  Result Date: 04/09/2021 CLINICAL DATA:  75 year old female with history of right pleural effusion and pulmonary edema. EXAM: PORTABLE CHEST 1 VIEW COMPARISON:  Chest x-ray 04/08/2021. FINDINGS: Opacity in the base of the right hemithorax compatible with atelectasis and/or consolidation, with superimposed moderate to large right pleural effusion. Left lung appears clear. No left pleural effusion. Cephalization of the pulmonary vasculature. Heart size is mildly enlarged. Atherosclerotic calcifications in the thoracic aorta. IMPRESSION: 1. Persistent moderate to large right pleural effusion with atelectasis and/or consolidation in the right lung base. 2. Mild cardiomegaly. 3. Aortic atherosclerosis. 1. Electronically Signed   By: Vinnie Langton M.D.   On: 04/09/2021 05:22   ECHOCARDIOGRAM COMPLETE  Result Date: 04/09/2021    ECHOCARDIOGRAM REPORT   Patient Name:   KAHDIJAH WARSTLER Date of Exam: 04/09/2021 Medical Rec #:  [id number]         Height:       63.5 in Accession #:    [id number]        Weight:       192.7 lb Date of Birth:  03-26-1946         BSA:          1.914 m Patient Age:    86 years          BP:           167/86 mmHg Patient Gender: F                 HR:           82 bpm. Exam Location:  [facility] Procedure: 2D Echo, Cardiac Doppler and Color Doppler Indications:    Congestive Heart Failure  History:        Patient has no prior history of Echocardiogram examinations.                 CHF, CAD,  Arrythmias:Atrial Fibrillation; Risk Factors:Diabetes                 and Hypertension.  Sonographer:    Wenda Low Referring Phys: [place]  1. Left ventricular ejection fraction, by estimation, is 60 to 65%. The left ventricle has normal function. The left ventricle has no regional wall motion abnormalities. There is severe concentric left ventricular hypertrophy. Left ventricular diastolic  parameters are consistent with Grade II diastolic dysfunction (pseudonormalization). There is the interventricular septum is flattened in systole and diastole, consistent with right ventricular pressure and volume overload.  2. Right ventricular systolic function is mildly reduced. The right ventricular size is mildly  enlarged. Mildly increased right ventricular wall thickness. There is severely elevated pulmonary artery systolic pressure. The estimated right ventricular systolic pressure is [id number] mmHg.  3. Left atrial size was moderately dilated.  4. The mitral valve is grossly normal. Mild mitral valve regurgitation.  5. Tricuspid valve regurgitation is moderate.  6. The aortic valve is tricuspid. Aortic valve regurgitation is not visualized. Mild aortic valve sclerosis is present, with no evidence of aortic valve stenosis. Aortic valve mean gradient measures 5.0 mmHg.  7. The inferior vena cava is dilated in size with <50% respiratory variability, suggesting right atrial pressure of 15 mmHg. Comparison(s): No prior Echocardiogram. FINDINGS  Left Ventricle: Left ventricular ejection fraction, by estimation, is 60 to 65%. The left ventricle has normal function. The left ventricle has no regional wall motion abnormalities. The left ventricular internal cavity size was normal in size. There is  severe concentric left ventricular hypertrophy. The interventricular septum is flattened in systole and diastole, consistent with right ventricular pressure and volume overload. Left ventricular diastolic  parameters are consistent with Grade II diastolic dysfunction (pseudonormalization). Right Ventricle: The right ventricular size is mildly enlarged. Mildly increased right ventricular wall thickness. Right ventricular systolic function is mildly reduced. There is severely elevated pulmonary artery systolic pressure. The tricuspid regurgitant velocity is 3.46 m/s, and with an assumed right atrial pressure of 15 mmHg, the estimated right ventricular systolic pressure is [id number] mmHg. Left Atrium: Left atrial size was moderately dilated. Right Atrium: Right atrial size was normal in size. Pericardium: There is no evidence of pericardial effusion. Mitral Valve: The mitral valve is grossly normal. Mild mitral valve regurgitation. MV peak gradient, 4.0 mmHg. The mean mitral valve gradient is 1.0 mmHg. Tricuspid Valve: The tricuspid valve is grossly normal. Tricuspid valve regurgitation is moderate. Aortic Valve: The aortic valve is tricuspid. There is mild aortic valve annular calcification. Aortic valve regurgitation is not visualized. Mild aortic valve sclerosis is present, with no evidence of aortic valve stenosis. Aortic valve mean gradient measures 5.0 mmHg. Aortic valve peak gradient measures 9.1 mmHg. Aortic valve area, by VTI measures 1.69 cm. Pulmonic Valve: The pulmonic valve was grossly normal. Pulmonic valve regurgitation is trivial. Aorta: The aortic root is normal in size and structure. Venous: The inferior vena cava is dilated in size with less than 50% respiratory variability, suggesting right atrial pressure of 15 mmHg. IAS/Shunts: No atrial level shunt detected by color flow Doppler.  LEFT VENTRICLE PLAX 2D LVIDd:         4.30 cm  Diastology LVIDs:         2.90 cm  LV e' medial:    3.78 cm/s LV PW:         1.90 cm  LV E/e' medial:  22.3 LV IVS:        1.40 cm  LV e' lateral:   10.10 cm/s LVOT diam:     2.00 cm  LV E/e' lateral: 8.4 LV SV:         63 LV SV Index:   33 LVOT Area:     3.14 cm  RIGHT  VENTRICLE RV Basal diam:  4.10 cm RV Mid diam:    3.20 cm LEFT ATRIUM              Index       RIGHT ATRIUM           Index LA diam:        4.30 cm  2.25 cm/m  RA Area:  18.40 cm LA Vol (A2C):   106.0 ml 55.37 ml/m RA Volume:   46.20 ml  24.13 ml/m LA Vol (A4C):   77.1 ml  40.27 ml/m LA Biplane Vol: 90.7 ml  47.38 ml/m  AORTIC VALVE                    PULMONIC VALVE AV Area (Vmax):    1.83 cm     PV Vmax:       0.77 m/s AV Area (Vmean):   1.80 cm     PV Peak grad:  2.4 mmHg AV Area (VTI):     1.69 cm AV Vmax:           151.00 cm/s AV Vmean:          102.000 cm/s AV VTI:            0.372 m AV Peak Grad:      9.1 mmHg AV Mean Grad:      5.0 mmHg LVOT Vmax:         88.10 cm/s LVOT Vmean:        58.500 cm/s LVOT VTI:          0.200 m LVOT/AV VTI ratio: 0.54  AORTA Ao Root diam: 3.00 cm MITRAL VALVE               TRICUSPID VALVE MV Area (PHT): 2.68 cm    TR Peak grad:   47.9 mmHg MV Area VTI:   1.79 cm    TR Vmax:        346.00 cm/s MV Peak grad:  4.0 mmHg MV Mean grad:  1.0 mmHg    SHUNTS MV Vmax:       1.00 m/s    Systemic VTI:  0.20 m MV Vmean:      51.8 cm/s   Systemic Diam: 2.00 cm MV Decel Time: 283 msec MV E velocity: 84.40 cm/s MV A velocity: 78.40 cm/s MV E/A ratio:  1.08 Rozann Lesches MD Electronically signed by Rozann Lesches MD Signature Date/Time: 04/09/2021/4:11:51 PM    Final    CT HEAD CODE STROKE WO CONTRAST  Result Date: 04/09/2021 CLINICAL DATA:  Code stroke.  Altered mental status EXAM: CT HEAD WITHOUT CONTRAST TECHNIQUE: Contiguous axial images were obtained from the base of the skull through the vertex without intravenous contrast. COMPARISON:  Head CT from yesterday FINDINGS: Brain: New cytotoxic edema in the parasagittal right frontal lobe, ACA distribution. Chronic small vessel ischemia in the hemispheric white matter with chronic basal ganglia lacunar infarcts. Chronic left larger than right cerebellar infarcts. Encephalomalacia in the left frontal lobe at the anterior  cranial fossa and anterior parasagittal region, likely also post ischemic. Vascular: No hyperdense vessel or unexpected calcification. Skull: Normal. Negative for fracture or focal lesion. Sinuses/Orbits: Bilateral cataract resection Other: These results were communicated to Dr. Wynetta Emery at 9:40 am on 04/09/2021 by text page via the [facility] messaging system. IMPRESSION: 1. Acute cortical infarct is newly seen in the parasagittal right frontal lobe, ACA branch distribution. 2. Chronic ischemic injury as described, including remote left ACA territory infarcts. Electronically Signed   By: Jorje Guild M.D.   On: 04/09/2021 09:42   US THORACENTESIS ASP PLEURAL SPACE W/IMG GUIDE  Result Date: 04/09/2021 INDICATION: RIGHT pleural effusion EXAM: ULTRASOUND GUIDED DIAGNOSTIC AND THERAPEUTIC RIGHT THORACENTESIS MEDICATIONS: None. COMPLICATIONS: None immediate. PROCEDURE: An ultrasound guided thoracentesis was thoroughly discussed with the patient and questions answered. The benefits, risks, alternatives and complications were also  discussed. The patient understands and wishes to proceed with the procedure. Written consent was obtained. Ultrasound was performed to localize and mark an adequate pocket of fluid in the RIGHT chest. The area was then prepped and draped in the normal sterile fashion. 1% Lidocaine was used for local anesthesia. Under ultrasound guidance a 5 [place] Yueh catheter was introduced. Thoracentesis was performed. The catheter was removed and a dressing applied. FINDINGS: A total of approximately 1 L of yellow RIGHT pleural fluid was removed. Samples were sent to the laboratory as requested by the clinical team. IMPRESSION: Successful ultrasound guided RIGHT thoracentesis yielding 1 L of pleural fluid. Electronically Signed   By: Lavonia Dana M.D.   On: 04/09/2021 14:23     CBC Recent Labs  Lab 04/08/21 1135 04/09/21 0558 04/10/21 0659 04/11/21 0702 04/12/21 0630  WBC 5.5 4.2 4.9 5.5 4.8  HGB  8.3* 7.6* 8.0* 8.1* 7.9*  HCT 28.8* 26.2* 28.7* 27.3* 28.1*  PLT 153 123* 137* 129* 130*  MCV 92.6 92.3 94.1 91.6 91.5  MCH 26.7 26.8 26.2 27.2 25.7*  MCHC 28.8* 29.0* 27.9* 29.7* 28.1*  RDW 17.9* 18.0* 17.7* 17.7* 17.7*  LYMPHSABS 1.5  --   --   --   --   MONOABS 0.3  --   --   --   --   EOSABS 0.1  --   --   --   --   BASOSABS 0.0  --   --   --   --     Chemistries  Recent Labs  Lab 04/08/21 1135 04/09/21 0558 04/10/21 0659 04/11/21 0702 04/12/21 0630  NA 136 139 137 138 137  K 3.7 4.0 4.1 4.0 4.2  CL 111 113* 110 111 110  CO2 18* 21* 21* 21* 21*  GLUCOSE 192* 104* 127* 111* 90  BUN 50* 51* 52* 55* 53*  CREATININE 6.41* 6.72* 6.88* 6.81* 6.75*  CALCIUM 7.6* 7.8* 7.8* 7.7* 7.7*  MG  --  1.8 1.8 1.7  --   AST 24  --   --   --   --   ALT 27  --   --   --   --   ALKPHOS 83  --   --   --   --   BILITOT 0.6  --   --   --   --    ------------------------------------------------------------------------------------------------------------------ No results for input(s): CHOL, HDL, LDLCALC, TRIG, CHOLHDL, LDLDIRECT in the last 72 hours.   Lab Results  Component Value Date   HGBA1C 5.9 (H) 04/08/2021   ------------------------------------------------------------------------------------------------------------------ No results for input(s): TSH, T4TOTAL, T3FREE, THYROIDAB in the last 72 hours.  Invalid input(s): FREET3 ------------------------------------------------------------------------------------------------------------------ No results for input(s): VITAMINB12, FOLATE, FERRITIN, TIBC, IRON, RETICCTPCT in the last 72 hours.  Coagulation profile No results for input(s): INR, PROTIME in the last 168 hours.  No results for input(s): DDIMER in the last 72 hours.  Cardiac Enzymes No results for input(s): CKMB, TROPONINI, MYOGLOBIN in the last 168 hours.  Invalid input(s):  CK ------------------------------------------------------------------------------------------------------------------    Component Value Date/Time   BNP 1,127.0 (H) 04/11/2021 [id number]     Roxan Hockey M.D on 04/12/2021 at 6:53 PM  Go to [email or web address] - for contact info  [facility] - Office  [phone]

## 2021-04-13 DIAGNOSIS — J9601 Acute respiratory failure with hypoxia: Secondary | ICD-10-CM | POA: Diagnosis not present

## 2021-04-13 LAB — CBC
HCT: 30.7 % — ABNORMAL LOW (ref 36.0–46.0)
Hemoglobin: 8.8 g/dL — ABNORMAL LOW (ref 12.0–15.0)
MCH: 26 pg (ref 26.0–34.0)
MCHC: 28.7 g/dL — ABNORMAL LOW (ref 30.0–36.0)
MCV: 90.8 fL (ref 80.0–100.0)
Platelets: 105 10*3/uL — ABNORMAL LOW (ref 150–400)
RBC: 3.38 MIL/uL — ABNORMAL LOW (ref 3.87–5.11)
RDW: 17.8 % — ABNORMAL HIGH (ref 11.5–15.5)
WBC: 5.3 10*3/uL (ref 4.0–10.5)
nRBC: 0.8 % — ABNORMAL HIGH (ref 0.0–0.2)

## 2021-04-13 LAB — GLUCOSE, CAPILLARY
Glucose-Capillary: 111 mg/dL — ABNORMAL HIGH (ref 70–99)
Glucose-Capillary: 101 mg/dL — ABNORMAL HIGH (ref 70–99)
Glucose-Capillary: 182 mg/dL — ABNORMAL HIGH (ref 70–99)
Glucose-Capillary: 143 mg/dL — ABNORMAL HIGH (ref 70–99)

## 2021-04-13 LAB — RESP PANEL BY RT-PCR (FLU A&B, COVID) ARPGX2
Influenza A by PCR: NEGATIVE
Influenza B by PCR: NEGATIVE
SARS Coronavirus 2 by RT PCR: NEGATIVE

## 2021-04-13 LAB — RENAL FUNCTION PANEL
Albumin: 2.3 g/dL — ABNORMAL LOW (ref 3.5–5.0)
Anion gap: 7 (ref 5–15)
BUN: 54 mg/dL — ABNORMAL HIGH (ref 8–23)
CO2: 21 mmol/L — ABNORMAL LOW (ref 22–32)
Calcium: 7.8 mg/dL — ABNORMAL LOW (ref 8.9–10.3)
Chloride: 109 mmol/L (ref 98–111)
Creatinine, Ser: 6.74 mg/dL — ABNORMAL HIGH (ref 0.44–1.00)
GFR, Estimated: 6 mL/min — ABNORMAL LOW (ref >60)
Glucose, Bld: 108 mg/dL — ABNORMAL HIGH (ref 70–99)
Phosphorus: 6 mg/dL — ABNORMAL HIGH (ref 2.5–4.6)
Potassium: 4.4 mmol/L (ref 3.5–5.1)
Sodium: 137 mmol/L (ref 135–145)

## 2021-04-13 MED ORDER — PANTOPRAZOLE SODIUM 40 MG PO TBEC: 40.0000 mg | DELAYED_RELEASE_TABLET | Freq: Every day | ORAL | 3 refills | Status: AC

## 2021-04-13 MED ORDER — SEVELAMER CARBONATE 800 MG PO TABS: 800.0000 mg | ORAL_TABLET | Freq: Three times a day (TID) | ORAL | 2 refills | Status: AC

## 2021-04-13 MED ORDER — ISOSORBIDE MONONITRATE ER 30 MG PO TB24: 30.0000 mg | ORAL_TABLET | Freq: Every day | ORAL | 3 refills | Status: DC

## 2021-04-13 MED ORDER — GERHARDT'S BUTT CREAM: 1.0000 "application " | TOPICAL_CREAM | Freq: Three times a day (TID) | CUTANEOUS | 2 refills | Status: AC

## 2021-04-13 MED ORDER — HYDRALAZINE HCL 50 MG PO TABS: 50.0000 mg | ORAL_TABLET | Freq: Three times a day (TID) | ORAL | 3 refills | Status: AC

## 2021-04-13 MED ORDER — LANTUS SOLOSTAR 100 UNIT/ML ~~LOC~~ SOPN: 4.0000 [IU] | PEN_INJECTOR | Freq: Every day | SUBCUTANEOUS | 11 refills | Status: DC

## 2021-04-13 MED ORDER — SEVELAMER CARBONATE 800 MG PO TABS
800.0000 mg | ORAL_TABLET | Freq: Three times a day (TID) | ORAL | Status: DC
  Administered 2021-04-13: 800 mg via ORAL
  Filled 2021-04-13 (×2): qty 1

## 2021-04-13 MED ORDER — ELIQUIS 2.5 MG PO TABS: 2.5000 mg | ORAL_TABLET | Freq: Two times a day (BID) | ORAL | 5 refills | Status: DC

## 2021-04-13 MED ORDER — INSULIN ASPART 100 UNIT/ML FLEXPEN: 0.0000 [IU] | PEN_INJECTOR | Freq: Three times a day (TID) | SUBCUTANEOUS | 11 refills | Status: AC

## 2021-04-13 MED ORDER — LANTUS SOLOSTAR 100 UNIT/ML ~~LOC~~ SOPN: 6.0000 [IU] | PEN_INJECTOR | Freq: Every day | SUBCUTANEOUS | 11 refills | Status: DC

## 2021-04-13 MED ORDER — SODIUM BICARBONATE 650 MG PO TABS: 1300.0000 mg | ORAL_TABLET | Freq: Two times a day (BID) | ORAL | 3 refills | Status: AC

## 2021-04-13 MED ORDER — OXYCODONE HCL 5 MG PO TABS: 5.0000 mg | ORAL_TABLET | Freq: Four times a day (QID) | ORAL | 0 refills | Status: DC | PRN

## 2021-04-13 MED ORDER — SENNOSIDES-DOCUSATE SODIUM 8.6-50 MG PO TABS: 2.0000 | ORAL_TABLET | Freq: Every day | ORAL | 11 refills | Status: AC

## 2021-04-13 MED ORDER — FUROSEMIDE 80 MG PO TABS: 80.0000 mg | ORAL_TABLET | Freq: Every day | ORAL | 2 refills | Status: AC

## 2021-04-13 MED ORDER — ONDANSETRON HCL 4 MG PO TABS: 4.0000 mg | ORAL_TABLET | Freq: Four times a day (QID) | ORAL | 0 refills | Status: AC | PRN

## 2021-04-13 MED ORDER — METOPROLOL SUCCINATE ER 100 MG PO TB24: 100.0000 mg | ORAL_TABLET | Freq: Every day | ORAL | 3 refills | Status: AC

## 2021-04-13 MED ORDER — CLONIDINE HCL 0.2 MG PO TABS: 0.2000 mg | ORAL_TABLET | Freq: Two times a day (BID) | ORAL | 5 refills | Status: DC

## 2021-04-13 MED ORDER — ACETAMINOPHEN 325 MG PO TABS: 650.0000 mg | ORAL_TABLET | Freq: Four times a day (QID) | ORAL | 0 refills | Status: AC | PRN

## 2021-04-13 NOTE — Progress Notes (Signed)
Nephrology Progress Note:  Subjective:   strict ins/outs not available.  Had 1.1 liters UOP over 10/6 as well as 1 unmeasured void.  She again states that she would not want to pursue dialysis even if it meant death should she not clean the blood with HD. She is a better today and is able to tell me month date and year correctly  Other notes - Recent hospital course with MRI seeming to show subacute or early subacute CVA-  not candidate for thrombolytics.  Had a thoracentesis of one liter  Review of systems: denies n/v  Denies shortness of breath or chest pain  Objective Vital signs in last 24 hours: Vitals:   04/12/21 2032 04/13/21 0424 04/13/21 0500 04/13/21 0748  BP: (!) 177/68 (!) 174/62  (!) 174/64  Pulse: 72 65  64  Resp: '18 19  20  '$ Temp: 98.2 F (36.8 C) 97.8 F (36.6 C)  99.1 F (37.3 C)  TempSrc: Oral Oral  Oral  SpO2: 91% 94%  97%  Weight:   82.5 kg   Height:       Weight change: -3.9 kg  Intake/Output Summary (Last 24 hours) at 04/13/2021 1020 Last data filed at 04/13/2021 [id number] Gross per 24 hour  Intake 660 ml  Output 1050 ml  Net -390 ml    Assessment/Plan: 75 year old BF history of many medical issues including CKD it seems-  now with failure to thrive   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  There are no absolute indications for dialysis but her renal failure could certainly be playing a part in her failure to thrive.  Her BUN is not exceedingly high however so unclear how much actual immediate benefit she would get from starting HD.   She has expressed not ever wanting HD.  Still question of mental status and insight but she is getting better.  Appreciate palliative care - her family would like to honor her wishes of not pursuing dialysis as long as the patient "fully understands" - note that the patient's mental status is improving slightly.  Note that we have  also been unsure that she could withstand the rigors of chronic dialysis therapy - Would continue palliative involvement outpatient and appreciate their involvement here - requested to obtain prior records from PCP if available - have not received and she is not able to provide a name  2. Hypertension/volume  - overloaded with pleural effusion.  lasix was increased to 80 mg BID IV and was later transitioned to lasix 80 mg PO daily. Not hypoxic.  Increased clondinine to 0.2 mg BID on 10/6  3. Anemia renal failure - Iron deficiency as well.  treating with IV iron x 4 doses and s/p aranesp 200 mcg once on 10/3  4. Hypothyroid-  per primary-  seems mild   5. Metabolic acidosis - continue bicarbonate po   6. Hyperphosphatemia - start renvela 800 mg TID with meals.  She is on dysphagia diet which limits  Nephrology will sign off.  She is anticipated to be discharged today to rehab.  Mental status improving and she continues to express that she would never want HD.  No urgent electrolyte abnormalities and renal function appears to have stabilized.  Palliative to follow outpatient.  Appreciate primary and palliative teams.  Please call us if we can be of any assistance  with her care.  Spoke with primary team.   Labs: Basic Metabolic Panel: Recent Labs  Lab 04/11/21 0702 04/12/21 0630 04/13/21 0536  NA 138 137 137  K 4.0 4.2 4.4  CL 111 110 109  CO2 21* 21* 21*  GLUCOSE 111* 90 108*  BUN 55* 53* 54*  CREATININE 6.81* 6.75* 6.74*  CALCIUM 7.7* 7.7* 7.8*  PHOS 5.5* 5.6* 6.0*   Liver Function Tests: Recent Labs  Lab 04/08/21 1135 04/09/21 0558 04/11/21 0702 04/12/21 0630 04/13/21 0536  AST 24  --   --   --   --   ALT 27  --   --   --   --   ALKPHOS 83  --   --   --   --   BILITOT 0.6  --   --   --   --   PROT 7.8  --   --   --   --   ALBUMIN 2.7*   < > 2.1* 2.2* 2.3*   < > = values in this interval not displayed.    No results for input(s): AMMONIA in the last 168  hours. CBC: Recent Labs  Lab 04/08/21 1135 04/09/21 0558 04/10/21 0659 04/11/21 0702 04/12/21 0630 04/13/21 0536  WBC 5.5 4.2 4.9 5.5 4.8 5.3  NEUTROABS 3.6  --   --   --   --   --   HGB 8.3* 7.6* 8.0* 8.1* 7.9* 8.8*  HCT 28.8* 26.2* 28.7* 27.3* 28.1* 30.7*  MCV 92.6 92.3 94.1 91.6 91.5 90.8  PLT 153 123* 137* 129* 130* 105*    CBG: Recent Labs  Lab 04/12/21 1123 04/12/21 1615 04/12/21 2034 04/13/21 0420 04/13/21 0752  GLUCAP 133* 117* 152* 111* 101*    Iron Studies:  No results for input(s): IRON, TIBC, TRANSFERRIN, FERRITIN in the last 72 hours.  Studies/Results: No results found. Medications: Infusions:    Scheduled Medications:  amLODipine  10 mg Oral Daily   apixaban  5 mg Oral BID   atorvastatin  40 mg Oral Daily   cloNIDine  0.2 mg Oral BID   furosemide  80 mg Oral Daily   Gerhardt's butt cream   Topical TID   hydrocerin   Topical BID   insulin aspart  0-6 Units Subcutaneous TID WC   isosorbide mononitrate  30 mg Oral Daily   metoprolol succinate  50 mg Oral Daily   mirtazapine  7.5 mg Oral QHS   pantoprazole  40 mg Oral Daily   sodium bicarbonate  650 mg Oral TID    have reviewed scheduled and prn medications.  Physical Exam:   General adult female in bed in no acute distress HEENT normocephalic atraumatic extraocular movements intact sclera anicteric Neck supple trachea midline Lungs clear but reduced on auscultation bilaterally normal work of breathing at rest  Heart S1S2 no rub Abdomen soft nontender nondistended Extremities no edema  Neuro - April 13, 2021; oriented to person and location of  Psych calm mood and affect   Claudia Desanctis, MD 04/13/2021,10:42 AM

## 2021-04-13 NOTE — Progress Notes (Signed)
Report given to [place] at [facility]. Patient waiting for EMS to be transported.

## 2021-04-13 NOTE — Care Management Important Message (Signed)
Important Message  Patient Details  Name: Tiffany Valencia MRN: [id number] Date of Birth: [birth year]   Medicare Important Message Given:  Yes     Tommy Medal 04/13/2021, 3:28 PM

## 2021-04-13 NOTE — Progress Notes (Signed)
SLP Cancellation Note  Patient Details Name: Tiffany Valencia MRN: [id number] DOB: 07-07-1946   Cancelled treatment:       Reason Eval/Treat Not Completed: Fatigue/lethargy limiting ability to participate;Patient at procedure or test/unavailable;attempted to see pt x2, but unsuccessful d/t procedure/lethargy.  Nursing/nurse tech stated pt consumed breakfast tray/meds without dysphagia noted.  Recommend ST f/u at SNF for diet tolerance/education for dysphagia.     Pat Raygen Linquist,M.S., CCC-SLP 04/13/2021, 1:57 PM

## 2021-04-13 NOTE — Discharge Instructions (Signed)
1)You are taking Apixaban/Eliquis which is a blood thinner so please Avoid ibuprofen/Advil/Aleve/Motrin/Goody Powders/Naproxen/BC powders/Meloxicam/Diclofenac/Indomethacin and other Nonsteroidal anti-inflammatory medications as these will make you more likely to bleed and can cause stomach ulcers, can also cause Kidney problems.   2)--- sliding scale insulin as advised :-insulin aspart (novoLOG) injection 0-10 Units 0-10 Units Subcutaneous, 3 times daily with meals CBG < 70: Implement Hypoglycemia Standing Orders and refer to Hypoglycemia Standing Orders sidebar report  CBG 70 - 120: 0 unit CBG 121 - 150: 0 unit  CBG 151 - 200: 1 unit CBG 201 - 250: 2 units CBG 251 - 300: 4 units CBG 301 - 350: 6 units  CBG 351 - 400: 8 units  CBG > 400: 10 units  3)Please follow-up with Neurologist Dr. Phillips Odor-- Phone: [phone], Address: [address], [place], [place] [address] in 4 to 6 weeks for recheck and reevaluation.  Please call to make appointment with him  4) outpatient follow-up with nephrologist in 1 to 2 weeks advised  5) Procrit/Epogen as well as iron infusions as per nephrologist  6) CBC and BMP every Wednesday starting Wednesday, 04/18/2021

## 2021-04-13 NOTE — Discharge Summary (Signed)
Tiffany Valencia, is a 75 y.o. female  DOB August 08, 1945  MRN [id number].  Admission date:  04/08/2021  Admitting Physician  Murlean Iba, MD  Discharge Date:  04/13/2021   Primary MD  Pcp, No  Recommendations for primary care physician for things to follow:   1)You are taking Apixaban/Eliquis which is a blood thinner so please Avoid ibuprofen/Advil/Aleve/Motrin/Goody Powders/Naproxen/BC powders/Meloxicam/Diclofenac/Indomethacin and other Nonsteroidal anti-inflammatory medications as these will make you more likely to bleed and can cause stomach ulcers, can also cause Kidney problems.   2)--- sliding scale insulin as advised :-insulin aspart (novoLOG) injection 0-10 Units 0-10 Units Subcutaneous, 3 times daily with meals CBG < 70: Implement Hypoglycemia Standing Orders and refer to Hypoglycemia Standing Orders sidebar report  CBG 70 - 120: 0 unit CBG 121 - 150: 0 unit  CBG 151 - 200: 1 unit CBG 201 - 250: 2 units CBG 251 - 300: 4 units CBG 301 - 350: 6 units  CBG 351 - 400: 8 units  CBG > 400: 10 units  3)Please follow-up with Neurologist Dr. Phillips Odor-- Phone: [phone], Address: [address], [place], [place] [address] in 4 to 6 weeks for recheck and reevaluation.  Please call to make appointment with him  4) outpatient follow-up with nephrologist in 1 to 2 weeks advised  5) Procrit/Epogen as well as iron infusions as per nephrologist  6) CBC and BMP every Wednesday starting Wednesday, 04/18/2021   Admission Diagnosis  Acute heart failure ([place]) [I50.9] Pulmonary edema [J81.1] SOB (shortness of breath) [R06.02] Pleural effusion, right [J90] AKI (acute kidney injury) ([place]) [N17.9] Acute on chronic systolic CHF (congestive heart failure) ([place]) [I50.23] Chronic kidney disease, unspecified CKD stage [N18.9]   Discharge Diagnosis  Acute heart failure ([place]) [I50.9] Pulmonary edema  [J81.1] SOB (shortness of breath) [R06.02] Pleural effusion, right [J90] AKI (acute kidney injury) ([place]) [N17.9] Acute on chronic systolic CHF (congestive heart failure) (HCC) [I50.23] Chronic kidney disease, unspecified CKD stage [N18.9]    Principal Problem:   Acute respiratory failure with hypoxia (HCC) Active Problems:   Acute heart failure (HCC)   Gait instability   DM (diabetes mellitus), type 2 with peripheral vascular complications (HCC)   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)   Anemia in chronic kidney disease (CKD)   Pleural effusion on right   Altered mental state   Volume overload   Metabolic acidosis   Elevated brain natriuretic peptide (BNP) level   Hypoalbuminemia   Dysarthria   Coronary artery disease s/p STENT   Acquired thrombophilia (HCC)   Chronic anticoagulation   presumed Paroxysmal atrial fibrillation   Hypertensive urgency   AKI (acute kidney injury) ([place])   Cerebrovascular accident (CVA) due to embolism of right anterior cerebral artery ([place])   Pressure injury of skin      Past Medical History:  Diagnosis Date   Anemia in chronic kidney disease (CKD)    Asthma    Chronic kidney disease    Coronary artery disease  Diabetes mellitus without complication ([place])    Hypertension     Past Surgical History:  Procedure Laterality Date   ABDOMINAL HYSTERECTOMY     BACK SURGERY       HPI  from the history and physical done on the day of admission:   Chief Complaint: edema in feet and legs   Historian(s): Pt is poor historian.  No family member present. Information taken from patient and ED provider and EMS records.     HPI: Tiffany Valencia is a 74 y.o. female who was recently relocated from [place] to [facility] via family member in early September 2022 has apparently been living with a family member for the last month.  According to what was reported, a family member went to visit the patient at her apartment  in [place] in early September 2022 and found the patient on the floor covered in feces and urine.  The patient was cleaned up at that time and brought to [facility] to live.  The patient reportedly was never taken to the emergency room or to a physician or care provider until now.   Reportedly, family member left patient with husband for past couple of days while out of town and returned to find the patient with altered mentation and increasing edema in the feet and legs up to the waist.  The patient also seemed more confused than normal and having more difficulty ambulating.  EMS found patient to have a pulse ox of 83% on room air and placed on 4L/min [place] with improvement to 96%.    No prior medical records could be found.  The patient reportedly has a history of coronary artery disease status post stent placement in 2013.  The patient has hyperlipidemia, hypertension, chronic kidney disease and had been seen by her nephrologist and started on sodium bicarbonate tablets for metabolic acidosis.  The patient presumably has a history of paroxysmal atrial fibrillation being on apixaban 2.5 mg twice daily and metoprolol succinate 200 mg daily.  The patient also has anemia of chronic kidney disease and taking an iron tablet daily.  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  The patient has gait instability and reportedly has fallen at home with difficulty ambulating.   ED Course: 97.8, 57, 16, 184/71, 96%, sodium 136, potassium 3.7, chloride 111, CO2 18, glucose 192, BUN 50, creatinine 6.41, calcium 7.6, albumin 2.7, total bilirubin 0.6, cardiac BNP 2269, high-sensitivity troponin 14, WBC 5.5, hemoglobin 8.3, platelet count 153.  TSH 9.838.  Chest x-ray with findings of a large right pleural effusion and opacity in the right lower lobe.  Pulmonary vascular congestion noted.  Patient was started on IV Lasix and admission was  requested.      Hospital Course:     Brief Summary:-  Tiffany Valencia is a 75 year old female recently brought to the area with family. Family report the patient was unable to care for herself in [place], brought her to [facility] for the past month. Reported hx includes:cad with stent 2013, htn, hld, ckd, afib, dm, and obesity, and CVA. No medical records were available from [place]/her previous providers. On 10/2 she was noted to be slow to respond, increased edema, was brought to the ED. Noted hypertension and edema, pleural effusion. 10/3 the patient was noted to be altered upon morning rounds, code stroke was called, confirmed by ct and mri. Left extremity weakness remains, permissive hypertension remains,  soon to restart her antihypertensives and apixaban.    A/p 1)Social/Ethics--- palliative care consult requested to help delineate goals of care, and to also help delineate decision makers for patient as patient is currently not able to make informed decisions -As per palliative care provider patient has requested-full CODE STATUS, outpatient palliative services to follow.  Jehovah's Witness, declines blood products.  Declines HD, declines life support. -Patient's family in agreement with above request -Please see palliative care provider note dated 04/12/2021     2)ACUTE Cerebrovascular accident (CVA) due to embolism of right anterior cerebral artery  -code stroke with head ct on10/3/22. -conducted full stroke work up  -MR brain with MR head and neck angiography -not a candidate for thrombolytics per neurology given patient was on apixaban and last known normal not clear  -Inpatient neurology consultation requested -Neurology recommended RESTARTING apixaban at 2.5 mg twice daily on 04/12/2021 --Aspirin, discontinued after 04/11/2021 -Transfer to SNF rehab at this time     3)Acute diastolic heart failure -Echo from 04/09/2021 with EF of 60 to 65% and grade 2 diastolic  dysfunction -Echo from 04/26/2021 also shows severe concentric LVH and interventricular septum is flattened in systole and diastolic consistent with right ventricular pressure and volume overload Diuresed well with IV Lasix, okay to transition to oral Lasix 80 mg p.o. daily upon discharge -Low-salt diet advised   4)Acute respiratory failure with hypoxia -Secondary to heart failure exacerbation volume overload and large right-sided pleural effusion-see echo report above # 3 -- Rt sided ultrasound thoracentesis done 04/09/21 -  -Also responded well to IV Lasix -Hypoxia has resolved    5) DM2 (diabetes mellitus) controlled with renal complications -[id number] is 5.9 reflecting excellent diabetic control PTA -Give Lantus 4 units nightly and -Use Novolog/Humalog Sliding scale insulin with Accu-Cheks/Fingersticks as ordered     6) HTN -Blood pressure medications have been adjusted please see discharge med rec    7) CKD (chronic kidney disease) stage IV -daily renal function labs -electrolyte correction as needed -appreciate nephrology consultation and recommendations -no indication for dialysis at this time - bicarb tablets and  Renvela as recommended by nephrology -Repeat BMP weekly every Wednesday starting 04/18/2021 -Outpatient follow-up with nephrology strongly advised     8)Anemia in chronic kidney disease (CKD) V -Procrit and iron supplementation IV x 3 doses per nephrology team -Weekly CBCs on Wednesday starting 04/18/2021-- -Given that patient is a Jehovah witness and refuses blood products--- she may benefit from Procrit/ESA agent and iron infusions from time to time to manage anemia   8)  Coronary artery disease s/p STENT -continue home statin, imdur and metoprolol   9)  Paroxysmal Atrial fibrillation/Acquired thrombophilia / Chronic anticoagulation --Patient with recurrent strokes -Restart apixaban at 2.5 mg twice daily on 04/12/2021 please see #1 above   10)FEN--speech therapist  recommends Dysphagia 3 (mechanical soft);Thin liquid   DVT prophylaxis:   restart apixaban on 04/12/21 Code Status: full Family Communication: t/c to daughter w/ pt permission Disposition Plan:   SNF placement      Consultants:  Neurology Nephrology Palliative care    Procedures:  -Ct head, code stroke -MR Angio head and neck wo contrast -MR brain wo contrast -US thoracentesis -US renal     Disposition--discharge to SNF rehab   in [place]    Disposition: The patient is from: Home              Anticipated d/c is to: SNF  Code Status :  -  Code Status: Full Code    Family Communication:   daughter,[name]  updated  Discharge Condition: stable  Follow UP   Contact information for after-discharge care     [facility] .   Service: Skilled Nursing Contact information: [place]                     Diet and Activity recommendation:  As advised  Discharge Instructions    Discharge Instructions     Call MD for:  difficulty breathing, headache or visual disturbances   Complete by: As directed    Call MD for:  persistant dizziness or light-headedness   Complete by: As directed    Call MD for:  persistant nausea and vomiting   Complete by: As directed    Call MD for:  temperature >100.4   Complete by: As directed    Diet - low sodium heart healthy   Complete by: As directed    Discharge instructions   Complete by: As directed    1)You are taking Apixaban/Eliquis which is a blood thinner so please Avoid ibuprofen/Advil/Aleve/Motrin/Goody Powders/Naproxen/BC powders/Meloxicam/Diclofenac/Indomethacin and other Nonsteroidal anti-inflammatory medications as these will make you more likely to bleed and can cause stomach ulcers, can also cause Kidney problems.   2)--- sliding scale insulin as advised :-insulin aspart (novoLOG) injection 0-10 Units 0-10  Units Subcutaneous, 3 times daily with meals CBG < 70: Implement Hypoglycemia Standing Orders and refer to Hypoglycemia Standing Orders sidebar report  CBG 70 - 120: 0 unit CBG 121 - 150: 0 unit  CBG 151 - 200: 1 unit CBG 201 - 250: 2 units CBG 251 - 300: 4 units CBG 301 - 350: 6 units  CBG 351 - 400: 8 units  CBG > 400: 10 units  3)Please follow-up with Neurologist Dr. Phillips Odor-- Phone: [phone], Address: [address], [place], [place] [address] in 4 to 6 weeks for recheck and reevaluation.  Please call to make appointment with him  4) outpatient follow-up with nephrologist in 1 to 2 weeks advised  5) Procrit/Epogen as well as iron infusions as per nephrologist  6) CBC and BMP every Wednesday starting Wednesday, 04/18/2021   Discharge wound care:   Complete by: As directed    Please apply barrier cream 2-3 times daily   Increase activity slowly   Complete by: As directed          Discharge Medications     Allergies as of 04/13/2021   No Known Allergies      Medication List     STOP taking these medications    famotidine 20 MG tablet Commonly known as: PEPCID   hydrocortisone 25 MG suppository Commonly known as: ANUSOL-HC   NIFEdipine 30 MG 24 hr tablet Commonly known as: ADALAT CC       TAKE these medications    acetaminophen 325 MG tablet Commonly known as: TYLENOL Take 2 tablets (650 mg total) by mouth every 6 (six) hours as needed for mild pain (or Fever >/= 101).   albuterol 108 (90 Base) MCG/ACT inhaler Commonly known as: VENTOLIN HFA Inhale into the lungs.   amLODipine 10 MG tablet Commonly known as: NORVASC Take 10 mg by mouth daily.   atorvastatin 80 MG tablet Commonly known as: LIPITOR Take 40 mg by mouth daily.   cloNIDine 0.2 MG tablet Commonly known as: CATAPRES Take  1 tablet (0.2 mg total) by mouth 2 (two) times daily. What changed: when to take this   Eliquis 2.5 MG Tabs tablet Generic drug: apixaban Take 1 tablet  (2.5 mg total) by mouth 2 (two) times daily.   folic acid 1 MG tablet Commonly known as: FOLVITE Take 1 mg by mouth daily.   furosemide 80 MG tablet Commonly known as: LASIX Take 1 tablet (80 mg total) by mouth daily. Start taking on: April 14, 2021 What changed:  medication strength how much to take   Gerhardt's butt cream Crea Apply 1 application topically 3 (three) times daily. Apply to buttocks/perineum   hydrALAZINE 50 MG tablet Commonly known as: APRESOLINE Take 1 tablet (50 mg total) by mouth 3 (three) times daily. What changed:  medication strength how much to take   insulin aspart 100 UNIT/ML FlexPen Commonly known as: NOVOLOG Inject 0-10 Units into the skin 3 (three) times daily with meals. insulin aspart (novoLOG) injection 0-10 Units 0-10 Units Subcutaneous, 3 times daily with meals CBG < 70: Implement Hypoglycemia Standing Orders and refer to Hypoglycemia Standing Orders sidebar report  CBG 70 - 120: 0 unit CBG 121 - 150: 0 unit  CBG 151 - 200: 1 unit CBG 201 - 250: 2 units CBG 251 - 300: 4 units CBG 301 - 350: 6 units  CBG 351 - 400: 8 units  CBG > 400: 10 units   isosorbide mononitrate 30 MG 24 hr tablet Commonly known as: IMDUR Take 1 tablet (30 mg total) by mouth daily. Start taking on: April 14, 2021 What changed:  medication strength how much to take   Lantus SoloStar 100 UNIT/ML Solostar Pen Generic drug: insulin glargine Inject 6 Units into the skin at bedtime. What changed:  how much to take when to take this   metoprolol succinate 100 MG 24 hr tablet Commonly known as: TOPROL-XL Take 1 tablet (100 mg total) by mouth daily. What changed: how much to take   mirtazapine 7.5 MG tablet Commonly known as: REMERON Take 7.5 mg by mouth at bedtime.   ondansetron 4 MG tablet Commonly known as: ZOFRAN Take 1 tablet (4 mg total) by mouth every 6 (six) hours as needed for nausea.   oxyCODONE 5 MG immediate release tablet Commonly known as: Oxy  IR/ROXICODONE Take 1 tablet (5 mg total) by mouth every 6 (six) hours as needed for up to 5 days for severe pain.   pantoprazole 40 MG tablet Commonly known as: PROTONIX Take 1 tablet (40 mg total) by mouth daily. Start taking on: April 14, 2021   senna-docusate 8.6-50 MG tablet Commonly known as: Senokot-S Take 2 tablets by mouth at bedtime.   sevelamer carbonate 800 MG tablet Commonly known as: RENVELA Take 1 tablet (800 mg total) by mouth 3 (three) times daily with meals.   sodium bicarbonate 650 MG tablet Take 2 tablets (1,300 mg total) by mouth 2 (two) times daily. What changed: when to take this   triamcinolone cream 0.1 % Commonly known as: KENALOG SMARTSIG:1 Application Topical 2-3 Times Daily   Vitamin D (Ergocalciferol) 1.25 MG (50000 UNIT) Caps capsule Commonly known as: DRISDOL Take by mouth.               Discharge Care Instructions  (From admission, onward)           Start     Ordered   04/13/21 0000  Discharge wound care:       Comments: Please apply barrier cream 2-3  times daily   04/13/21 1207            Major procedures and Radiology Reports - PLEASE review detailed and final reports for all details, in brief -    DG Chest 1 View  Result Date: 04/09/2021 CLINICAL DATA:  RIGHT pleural effusion post thoracentesis EXAM: CHEST  1 VIEW COMPARISON:  Exam at 1341 hours compared to 0451 hours FINDINGS: Resolution of RIGHT pleural effusion. Minimal residual bibasilar atelectasis. No pneumothorax. Enlargement of cardiac silhouette persists. Atherosclerotic calcification aorta. IMPRESSION: No pneumothorax following RIGHT thoracentesis. Minimal bibasilar atelectasis. Electronically Signed   By: Lavonia Dana M.D.   On: 04/09/2021 14:37   DG Chest 1 View  Result Date: 04/08/2021 CLINICAL DATA:  [place] this morning. Low O2 sats. Confusion for 2 days. EXAM: CHEST  1 VIEW COMPARISON:  None. FINDINGS: Heart is enlarged but also accentuated by technique.  Moderate RIGHT pleural effusion is noted, with opacification of the RIGHT lung base which obscures the RIGHT hemidiaphragm. Pulmonary vascular congestion is present. LEFT lung is otherwise clear. No pneumothorax. No acute displaced fractures. IMPRESSION: Cardiomegaly and pulmonary vascular congestion. RIGHT pleural effusion and RIGHT LOWER lobe opacity consistent with atelectasis or infiltrate. Electronically Signed   By: Nolon Nations M.D.   On: 04/08/2021 12:30   CT Head Wo Contrast  Result Date: 04/08/2021 CLINICAL DATA:  Mental status change. EXAM: CT HEAD WITHOUT CONTRAST TECHNIQUE: Contiguous axial images were obtained from the base of the skull through the vertex without intravenous contrast. COMPARISON:  None. FINDINGS: Brain: Ventricles and sulci are prominent compatible with atrophy. Periventricular and subcortical white matter hypodensities compatible with chronic microvascular ischemic changes. Low-attenuation throughout the majority of the left cerebellar hemisphere. 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 No evidence for acute intracranial hemorrhage or significant mass effect. Vascular: Unremarkable. Skull: Intact. Sinuses/Orbits: Paranasal sinuses are well aerated. Mastoid air cells are unremarkable. Other: None. IMPRESSION: Low-attenuation within the majority of the left cerebellar hemisphere may represent subacute to chronic infarct, in the absence of priors. Consider further evaluation with MRI as clinically indicated. Atrophy and chronic microvascular ischemic changes. Electronically Signed   By: Lovey Newcomer M.D.   On: 04/08/2021 12:13   MR ANGIO HEAD WO CONTRAST  Result Date: 04/09/2021 CLINICAL DATA:  Altered mental status, does not communicate EXAM: MRI HEAD WITHOUT CONTRAST MRA HEAD WITHOUT CONTRAST MRA NECK WITHOUT CONTRAST TECHNIQUE: Multiplanar, multiecho pulse sequences of the brain and surrounding structures were obtained without intravenous contrast.  Angiographic images of the Circle of Willis were obtained using MRA technique without intravenous contrast. Angiographic images of the neck were obtained using MRA technique without intravenous contrast. Carotid stenosis measurements (when applicable) are obtained utilizing NASCET criteria, using the distal internal carotid diameter as the denominator. COMPARISON:  Same-day noncontrast CT head FINDINGS: Image quality is significantly degraded by motion artifact. MRI HEAD FINDINGS Brain: There is diffusion restriction in the right ACA distribution involving the right aspect of the genu of the corpus callosum and right parasagittal frontal lobe. The FLAIR sequences markedly motion degraded, but there appears to be associated FLAIR signal abnormality. Findings consistent with evolving early subacute infarct. Susceptibility artifact along the falx on the T2* sequence is likely due to bulky dural calcifications seen on the same-day head CT. There is no convincing evidence of hemorrhagic transformation. There is a large remote infarct in the left cerebellar hemisphere and a small remote infarct in the right cerebellar hemisphere. There is a background of mild-to-moderate global parenchymal volume  loss with enlargement of the ventricular system and extra-axial CSF spaces. Additional foci of T2/FLAIR signal abnormality in the subcortical and periventricular white matter likely reflects sequela of chronic white matter microangiopathy. Vascular: The major intracranial flow voids are present. Skull and upper cervical spine: Normal marrow signal. Sinuses/Orbits: The imaged paranasal sinuses are clear. Bilateral lens implants are in place. The globes and orbits are otherwise unremarkable. Other: There is a left mastoid effusion. MRA HEAD FINDINGS Evaluation of the intracranial vasculature is markedly degraded by motion artifact. Anterior circulation: The right intracranial ICA appears patent. Evaluation of the left intracranial  ICA is markedly degraded, but appears to be patent. The bilateral M1 segments are patent. The distal branches appear overall patent, but are not well evaluated. The proximal anterior cerebral arteries are patent. The distal anterior cerebral arteries are not well evaluated. Anterior circulation aneurysm can not be excluded. Posterior circulation: The V4 segments of the vertebral arteries are patent. The basilar artery is patent. The proximal PCAs are patent. The distal branches are not well evaluated. Posterior circulation aneurysms can not be excluded. Anatomic variants: The posterior communicating arteries are not definitively identified. MRA NECK FINDINGS Evaluation of the vasculature of the neck is markedly degraded by motion artifact. Aortic arch: Not evaluated. Right carotid System: Time-of-flight MRA demonstrates antegrade flow in the right carotid system. Stenosis or dissection can not be evaluated. Left carotid System: Time-of-flight MRA demonstrates antegrade flow in the left carotid system. Stenosis or dissection can not be evaluated. Vertebral arteries: Time-of-flight MRA demonstrates antegrade flow in the vertebral arteries. Stenosis or dissection can not be evaluated. IMPRESSION: 1. Markedly motion degraded study with essentially nondiagnostic MRA of the head and neck. 2. Early subacute infarct in the right ACA distribution without definite evidence of hemorrhagic transformation. 3. The intracranial vasculature is markedly suboptimally evaluated. The proximal vessels appear grossly patent. The distal vasculature is not evaluated. 4. Time-of-flight MRA demonstrates antegrade flow in the carotid systems and vertebral arteries. Stenoses or dissection can not be evaluated. 5. 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 6. Consider CTA of the head/neck for evaluation of the vasculature as indicated given reduced susceptibility to motion artifact. Electronically Signed    By: Valetta Mole M.D.   On: 04/09/2021 11:08   MR ANGIO NECK WO CONTRAST  Result Date: 04/09/2021 CLINICAL DATA:  Altered mental status, does not communicate EXAM: MRI HEAD WITHOUT CONTRAST MRA HEAD WITHOUT CONTRAST MRA NECK WITHOUT CONTRAST TECHNIQUE: Multiplanar, multiecho pulse sequences of the brain and surrounding structures were obtained without intravenous contrast. Angiographic images of the Circle of Willis were obtained using MRA technique without intravenous contrast. Angiographic images of the neck were obtained using MRA technique without intravenous contrast. Carotid stenosis measurements (when applicable) are obtained utilizing NASCET criteria, using the distal internal carotid diameter as the denominator. COMPARISON:  Same-day noncontrast CT head FINDINGS: Image quality is significantly degraded by motion artifact. MRI HEAD FINDINGS Brain: There is diffusion restriction in the right ACA distribution involving the right aspect of the genu of the corpus callosum and right parasagittal frontal lobe. The FLAIR sequences markedly motion degraded, but there appears to be associated FLAIR signal abnormality. Findings consistent with evolving early subacute infarct. Susceptibility artifact along the falx on the T2* sequence is likely due to bulky dural calcifications seen on the same-day head CT. There is no convincing evidence of hemorrhagic transformation. There is a large remote infarct in the left cerebellar hemisphere and a small remote infarct in  the right cerebellar hemisphere. There is a background of mild-to-moderate global parenchymal volume loss with enlargement of the ventricular system and extra-axial CSF spaces. Additional foci of T2/FLAIR signal abnormality in the subcortical and periventricular white matter likely reflects sequela of chronic white matter microangiopathy. Vascular: The major intracranial flow voids are present. Skull and upper cervical spine: Normal marrow signal.  Sinuses/Orbits: The imaged paranasal sinuses are clear. Bilateral lens implants are in place. The globes and orbits are otherwise unremarkable. Other: There is a left mastoid effusion. MRA HEAD FINDINGS Evaluation of the intracranial vasculature is markedly degraded by motion artifact. Anterior circulation: The right intracranial ICA appears patent. Evaluation of the left intracranial ICA is markedly degraded, but appears to be patent. The bilateral M1 segments are patent. The distal branches appear overall patent, but are not well evaluated. The proximal anterior cerebral arteries are patent. The distal anterior cerebral arteries are not well evaluated. Anterior circulation aneurysm can not be excluded. Posterior circulation: The V4 segments of the vertebral arteries are patent. The basilar artery is patent. The proximal PCAs are patent. The distal branches are not well evaluated. Posterior circulation aneurysms can not be excluded. Anatomic variants: The posterior communicating arteries are not definitively identified. MRA NECK FINDINGS Evaluation of the vasculature of the neck is markedly degraded by motion artifact. Aortic arch: Not evaluated. Right carotid System: Time-of-flight MRA demonstrates antegrade flow in the right carotid system. Stenosis or dissection can not be evaluated. Left carotid System: Time-of-flight MRA demonstrates antegrade flow in the left carotid system. Stenosis or dissection can not be evaluated. Vertebral arteries: Time-of-flight MRA demonstrates antegrade flow in the vertebral arteries. Stenosis or dissection can not be evaluated. IMPRESSION: 1. Markedly motion degraded study with essentially nondiagnostic MRA of the head and neck. 2. Early subacute infarct in the right ACA distribution without definite evidence of hemorrhagic transformation. 3. The intracranial vasculature is markedly suboptimally evaluated. The proximal vessels appear grossly patent. The distal vasculature is not  evaluated. 4. Time-of-flight MRA demonstrates antegrade flow in the carotid systems and vertebral arteries. Stenoses or dissection can not be evaluated. 5. 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 6. Consider CTA of the head/neck for evaluation of the vasculature as indicated given reduced susceptibility to motion artifact. Electronically Signed   By: Valetta Mole M.D.   On: 04/09/2021 11:08   MR BRAIN WO CONTRAST  Result Date: 04/09/2021 CLINICAL DATA:  Altered mental status, does not communicate EXAM: MRI HEAD WITHOUT CONTRAST MRA HEAD WITHOUT CONTRAST MRA NECK WITHOUT CONTRAST TECHNIQUE: Multiplanar, multiecho pulse sequences of the brain and surrounding structures were obtained without intravenous contrast. Angiographic images of the Circle of Willis were obtained using MRA technique without intravenous contrast. Angiographic images of the neck were obtained using MRA technique without intravenous contrast. Carotid stenosis measurements (when applicable) are obtained utilizing NASCET criteria, using the distal internal carotid diameter as the denominator. COMPARISON:  Same-day noncontrast CT head FINDINGS: Image quality is significantly degraded by motion artifact. MRI HEAD FINDINGS Brain: There is diffusion restriction in the right ACA distribution involving the right aspect of the genu of the corpus callosum and right parasagittal frontal lobe. The FLAIR sequences markedly motion degraded, but there appears to be associated FLAIR signal abnormality. Findings consistent with evolving early subacute infarct. Susceptibility artifact along the falx on the T2* sequence is likely due to bulky dural calcifications seen on the same-day head CT. There is no convincing evidence of hemorrhagic transformation. There is a large remote  infarct in the left cerebellar hemisphere and a small remote infarct in the right cerebellar hemisphere. There is a background of mild-to-moderate  global parenchymal volume loss with enlargement of the ventricular system and extra-axial CSF spaces. Additional foci of T2/FLAIR signal abnormality in the subcortical and periventricular white matter likely reflects sequela of chronic white matter microangiopathy. Vascular: The major intracranial flow voids are present. Skull and upper cervical spine: Normal marrow signal. Sinuses/Orbits: The imaged paranasal sinuses are clear. Bilateral lens implants are in place. The globes and orbits are otherwise unremarkable. Other: There is a left mastoid effusion. MRA HEAD FINDINGS Evaluation of the intracranial vasculature is markedly degraded by motion artifact. Anterior circulation: The right intracranial ICA appears patent. Evaluation of the left intracranial ICA is markedly degraded, but appears to be patent. The bilateral M1 segments are patent. The distal branches appear overall patent, but are not well evaluated. The proximal anterior cerebral arteries are patent. The distal anterior cerebral arteries are not well evaluated. Anterior circulation aneurysm can not be excluded. Posterior circulation: The V4 segments of the vertebral arteries are patent. The basilar artery is patent. The proximal PCAs are patent. The distal branches are not well evaluated. Posterior circulation aneurysms can not be excluded. Anatomic variants: The posterior communicating arteries are not definitively identified. MRA NECK FINDINGS Evaluation of the vasculature of the neck is markedly degraded by motion artifact. Aortic arch: Not evaluated. Right carotid System: Time-of-flight MRA demonstrates antegrade flow in the right carotid system. Stenosis or dissection can not be evaluated. Left carotid System: Time-of-flight MRA demonstrates antegrade flow in the left carotid system. Stenosis or dissection can not be evaluated. Vertebral arteries: Time-of-flight MRA demonstrates antegrade flow in the vertebral arteries. Stenosis or dissection can  not be evaluated. IMPRESSION: 1. Markedly motion degraded study with essentially nondiagnostic MRA of the head and neck. 2. Early subacute infarct in the right ACA distribution without definite evidence of hemorrhagic transformation. 3. The intracranial vasculature is markedly suboptimally evaluated. The proximal vessels appear grossly patent. The distal vasculature is not evaluated. 4. Time-of-flight MRA demonstrates antegrade flow in the carotid systems and vertebral arteries. Stenoses or dissection can not be evaluated. 5. 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 6. Consider CTA of the head/neck for evaluation of the vasculature as indicated given reduced susceptibility to motion artifact. Electronically Signed   By: Valetta Mole M.D.   On: 04/09/2021 11:08   US RENAL  Result Date: 04/09/2021 CLINICAL DATA:  Pulmonary edema, pleural effusion, diabetes mellitus, hypertension, altered mental EXAM: RENAL / URINARY TRACT ULTRASOUND COMPLETE COMPARISON:  None FINDINGS: Right Kidney: Renal measurements: 10.3 x 5.6 x 5.5 cm = volume: 164 mL. Normal cortical thickness. Significantly increased cortical echogenicity. No hydronephrosis or shadowing calcification. Multiple RIGHT renal cysts, largest 2.2 cm and 2.3 cm in greatest sizes. Left Kidney: No LEFT kidney visualized, question obscured, ectopic, or surgically versus developmentally absent. Bladder: Appears normal for degree of bladder distention. Other: N/A IMPRESSION: Nonvisualization of LEFT kidney. Medical renal disease changes RIGHT kidney with multiple renal cysts. Electronically Signed   By: Lavonia Dana M.D.   On: 04/09/2021 14:39   DG Chest Port 1 View  Result Date: 04/09/2021 CLINICAL DATA:  75 year old female with history of right pleural effusion and pulmonary edema. EXAM: PORTABLE CHEST 1 VIEW COMPARISON:  Chest x-ray 04/08/2021. FINDINGS: Opacity in the base of the right hemithorax compatible with atelectasis  and/or consolidation, with superimposed moderate to large right pleural effusion. Left lung appears  clear. No left pleural effusion. Cephalization of the pulmonary vasculature. Heart size is mildly enlarged. Atherosclerotic calcifications in the thoracic aorta. IMPRESSION: 1. Persistent moderate to large right pleural effusion with atelectasis and/or consolidation in the right lung base. 2. Mild cardiomegaly. 3. Aortic atherosclerosis. 1. Electronically Signed   By: Vinnie Langton M.D.   On: 04/09/2021 05:22   ECHOCARDIOGRAM COMPLETE  Result Date: 04/09/2021    ECHOCARDIOGRAM REPORT   Patient Name:   Tiffany Valencia Date of Exam: 04/09/2021 Medical Rec #:  [id number]         Height:       63.5 in Accession #:    [id number]        Weight:       192.7 lb Date of Birth:  02-Mar-1946         BSA:          1.914 m Patient Age:    52 years          BP:           167/86 mmHg Patient Gender: F                 HR:           82 bpm. Exam Location:  [facility] Procedure: 2D Echo, Cardiac Doppler and Color Doppler Indications:    Congestive Heart Failure  History:        Patient has no prior history of Echocardiogram examinations.                 CHF, CAD, Arrythmias:Atrial Fibrillation; Risk Factors:Diabetes                 and Hypertension.  Sonographer:    Wenda Low Referring Phys: [place]  1. Left ventricular ejection fraction, by estimation, is 60 to 65%. The left ventricle has normal function. The left ventricle has no regional wall motion abnormalities. There is severe concentric left ventricular hypertrophy. Left ventricular diastolic  parameters are consistent with Grade II diastolic dysfunction (pseudonormalization). There is the interventricular septum is flattened in systole and diastole, consistent with right ventricular pressure and volume overload.  2. Right ventricular systolic function is mildly reduced. The right ventricular size is mildly enlarged. Mildly increased  right ventricular wall thickness. There is severely elevated pulmonary artery systolic pressure. The estimated right ventricular systolic pressure is [id number] mmHg.  3. Left atrial size was moderately dilated.  4. The mitral valve is grossly normal. Mild mitral valve regurgitation.  5. Tricuspid valve regurgitation is moderate.  6. The aortic valve is tricuspid. Aortic valve regurgitation is not visualized. Mild aortic valve sclerosis is present, with no evidence of aortic valve stenosis. Aortic valve mean gradient measures 5.0 mmHg.  7. The inferior vena cava is dilated in size with <50% respiratory variability, suggesting right atrial pressure of 15 mmHg. Comparison(s): No prior Echocardiogram. FINDINGS  Left Ventricle: Left ventricular ejection fraction, by estimation, is 60 to 65%. The left ventricle has normal function. The left ventricle has no regional wall motion abnormalities. The left ventricular internal cavity size was normal in size. There is  severe concentric left ventricular hypertrophy. The interventricular septum is flattened in systole and diastole, consistent with right ventricular pressure and volume overload. Left ventricular diastolic parameters are consistent with Grade II diastolic dysfunction (pseudonormalization). Right Ventricle: The right ventricular size is mildly enlarged. Mildly increased right ventricular wall thickness. Right ventricular systolic function is mildly reduced. There is severely elevated pulmonary  artery systolic pressure. The tricuspid regurgitant velocity is 3.46 m/s, and with an assumed right atrial pressure of 15 mmHg, the estimated right ventricular systolic pressure is [id number] mmHg. Left Atrium: Left atrial size was moderately dilated. Right Atrium: Right atrial size was normal in size. Pericardium: There is no evidence of pericardial effusion. Mitral Valve: The mitral valve is grossly normal. Mild mitral valve regurgitation. MV peak gradient, 4.0 mmHg. The mean mitral  valve gradient is 1.0 mmHg. Tricuspid Valve: The tricuspid valve is grossly normal. Tricuspid valve regurgitation is moderate. Aortic Valve: The aortic valve is tricuspid. There is mild aortic valve annular calcification. Aortic valve regurgitation is not visualized. Mild aortic valve sclerosis is present, with no evidence of aortic valve stenosis. Aortic valve mean gradient measures 5.0 mmHg. Aortic valve peak gradient measures 9.1 mmHg. Aortic valve area, by VTI measures 1.69 cm. Pulmonic Valve: The pulmonic valve was grossly normal. Pulmonic valve regurgitation is trivial. Aorta: The aortic root is normal in size and structure. Venous: The inferior vena cava is dilated in size with less than 50% respiratory variability, suggesting right atrial pressure of 15 mmHg. IAS/Shunts: No atrial level shunt detected by color flow Doppler.  LEFT VENTRICLE PLAX 2D LVIDd:         4.30 cm  Diastology LVIDs:         2.90 cm  LV e' medial:    3.78 cm/s LV PW:         1.90 cm  LV E/e' medial:  22.3 LV IVS:        1.40 cm  LV e' lateral:   10.10 cm/s LVOT diam:     2.00 cm  LV E/e' lateral: 8.4 LV SV:         63 LV SV Index:   33 LVOT Area:     3.14 cm  RIGHT VENTRICLE RV Basal diam:  4.10 cm RV Mid diam:    3.20 cm LEFT ATRIUM              Index       RIGHT ATRIUM           Index LA diam:        4.30 cm  2.25 cm/m  RA Area:     18.40 cm LA Vol (A2C):   106.0 ml 55.37 ml/m RA Volume:   46.20 ml  24.13 ml/m LA Vol (A4C):   77.1 ml  40.27 ml/m LA Biplane Vol: 90.7 ml  47.38 ml/m  AORTIC VALVE                    PULMONIC VALVE AV Area (Vmax):    1.83 cm     PV Vmax:       0.77 m/s AV Area (Vmean):   1.80 cm     PV Peak grad:  2.4 mmHg AV Area (VTI):     1.69 cm AV Vmax:           151.00 cm/s AV Vmean:          102.000 cm/s AV VTI:            0.372 m AV Peak Grad:      9.1 mmHg AV Mean Grad:      5.0 mmHg LVOT Vmax:         88.10 cm/s LVOT Vmean:        58.500 cm/s LVOT VTI:          0.200 m LVOT/AV VTI ratio:  0.74  AORTA  Ao Root diam: 3.00 cm MITRAL VALVE               TRICUSPID VALVE MV Area (PHT): 2.68 cm    TR Peak grad:   47.9 mmHg MV Area VTI:   1.79 cm    TR Vmax:        346.00 cm/s MV Peak grad:  4.0 mmHg MV Mean grad:  1.0 mmHg    SHUNTS MV Vmax:       1.00 m/s    Systemic VTI:  0.20 m MV Vmean:      51.8 cm/s   Systemic Diam: 2.00 cm MV Decel Time: 283 msec MV E velocity: 84.40 cm/s MV A velocity: 78.40 cm/s MV E/A ratio:  1.08 Rozann Lesches MD Electronically signed by Rozann Lesches MD Signature Date/Time: 04/09/2021/4:11:51 PM    Final    CT HEAD CODE STROKE WO CONTRAST  Result Date: 04/09/2021 CLINICAL DATA:  Code stroke.  Altered mental status EXAM: CT HEAD WITHOUT CONTRAST TECHNIQUE: Contiguous axial images were obtained from the base of the skull through the vertex without intravenous contrast. COMPARISON:  Head CT from yesterday FINDINGS: Brain: New cytotoxic edema in the parasagittal right frontal lobe, ACA distribution. Chronic small vessel ischemia in the hemispheric white matter with chronic basal ganglia lacunar infarcts. Chronic left larger than right cerebellar infarcts. Encephalomalacia in the left frontal lobe at the anterior cranial fossa and anterior parasagittal region, likely also post ischemic. Vascular: No hyperdense vessel or unexpected calcification. Skull: Normal. Negative for fracture or focal lesion. Sinuses/Orbits: Bilateral cataract resection Other: These results were communicated to Dr. Wynetta Emery at 9:40 am on 04/09/2021 by text page via the [facility] messaging system. IMPRESSION: 1. Acute cortical infarct is newly seen in the parasagittal right frontal lobe, ACA branch distribution. 2. Chronic ischemic injury as described, including remote left ACA territory infarcts. Electronically Signed   By: Jorje Guild M.D.   On: 04/09/2021 09:42   US THORACENTESIS ASP PLEURAL SPACE W/IMG GUIDE  Result Date: 04/09/2021 INDICATION: RIGHT pleural effusion EXAM: ULTRASOUND GUIDED DIAGNOSTIC AND  THERAPEUTIC RIGHT THORACENTESIS MEDICATIONS: None. COMPLICATIONS: None immediate. PROCEDURE: An ultrasound guided thoracentesis was thoroughly discussed with the patient and questions answered. The benefits, risks, alternatives and complications were also discussed. The patient understands and wishes to proceed with the procedure. Written consent was obtained. Ultrasound was performed to localize and mark an adequate pocket of fluid in the RIGHT chest. The area was then prepped and draped in the normal sterile fashion. 1% Lidocaine was used for local anesthesia. Under ultrasound guidance a 5 [place] Yueh catheter was introduced. Thoracentesis was performed. The catheter was removed and a dressing applied. FINDINGS: A total of approximately 1 L of yellow RIGHT pleural fluid was removed. Samples were sent to the laboratory as requested by the clinical team. IMPRESSION: Successful ultrasound guided RIGHT thoracentesis yielding 1 L of pleural fluid. Electronically Signed   By: Lavonia Dana M.D.   On: 04/09/2021 14:23    Micro Results     Recent Results (from the past 240 hour(s))  Resp Panel by RT-PCR (Flu A&B, Covid) Nasopharyngeal Swab     Status: None   Collection Time: 04/08/21 10:40 AM   Specimen: Nasopharyngeal Swab; Nasopharyngeal(NP) swabs in vial transport medium  Result Value Ref Range Status   SARS Coronavirus 2 by RT PCR NEGATIVE NEGATIVE Final    Comment: (NOTE) SARS-CoV-2 target nucleic acids are NOT DETECTED.  The SARS-CoV-2 RNA is generally detectable in  upper respiratory specimens during the acute phase of infection. The lowest concentration of SARS-CoV-2 viral copies this assay can detect is 138 copies/mL. A negative result does not preclude SARS-Cov-2 infection and should not be used as the sole basis for treatment or other patient management decisions. A negative result may occur with  improper specimen collection/handling, submission of specimen other than nasopharyngeal swab,  presence of viral mutation(s) within the areas targeted by this assay, and inadequate number of viral copies(<138 copies/mL). A negative result must be combined with clinical observations, patient history, and epidemiological information. The expected result is Negative.  Fact Sheet for Patients:  EntrepreneurPulse.com.au  Fact Sheet for Healthcare Providers:  IncredibleEmployment.be  This test is no t yet approved or cleared by the [place] FDA and  has been authorized for detection and/or diagnosis of SARS-CoV-2 by FDA under an Emergency Use Authorization (EUA). This EUA will remain  in effect (meaning this test can be used) for the duration of the COVID-19 declaration under Section 564(b)(1) of the Act, 21 U.S.C.section 360bbb-3(b)(1), unless the authorization is terminated  or revoked sooner.       Influenza A by PCR NEGATIVE NEGATIVE Final   Influenza B by PCR NEGATIVE NEGATIVE Final    Comment: (NOTE) The Xpert Xpress SARS-CoV-2/FLU/RSV plus assay is intended as an aid in the diagnosis of influenza from Nasopharyngeal swab specimens and should not be used as a sole basis for treatment. Nasal washings and aspirates are unacceptable for Xpert Xpress SARS-CoV-2/FLU/RSV testing.  Fact Sheet for Patients: EntrepreneurPulse.com.au  Fact Sheet for Healthcare Providers: IncredibleEmployment.be  This test is not yet approved or cleared by the [place] FDA and has been authorized for detection and/or diagnosis of SARS-CoV-2 by FDA under an Emergency Use Authorization (EUA). This EUA will remain in effect (meaning this test can be used) for the duration of the COVID-19 declaration under Section 564(b)(1) of the Act, 21 U.S.C. section 360bbb-3(b)(1), unless the authorization is terminated or revoked.  Performed at [facility], [address], [place], [place] [address]   Culture, body fluid w Gram  Stain-bottle     Status: None (Preliminary result)   Collection Time: 04/09/21  1:45 PM   Specimen: Pleura  Result Value Ref Range Status   Specimen Description PLEURAL RIGHT  Final   Special Requests NONE  Final   Culture   Final    NO GROWTH 4 DAYS Performed at [facility], [address] [place], [place] [address]    Report Status PENDING  Incomplete  Gram stain     Status: None   Collection Time: 04/09/21  1:45 PM   Specimen: Pleura  Result Value Ref Range Status   Specimen Description PLEURAL RIGHT  Final   Special Requests NONE  Final   Gram Stain   Final    NO ORGANISMS SEEN WBC SEEN CYTOSPIN SMEAR Performed at [facility], [address] [place],  [address]    Report Status 04/09/2021 FINAL  Final       Today   Subjective    Tiffany Valencia today has no new complaints, no fevers no chills no vomiting -          Patient has been seen and examined prior to discharge   Objective   Blood pressure (!) 174/64, pulse 64, temperature 99.1 F (37.3 C), temperature source Oral, resp. rate 20, height 5' 3.5" (1.613 m), weight 82.5 kg, SpO2 97 %.   Intake/Output Summary (Last 24 hours) at 04/13/2021 1209  Last data filed at 04/13/2021 0924 Gross per 24 hour  Intake 660 ml  Output 1050 ml  Net -390 ml   Exam Gen:- Awake Alert, no acute distress , cooperative HEENT:- [place].AT, No sclera icterus Neck-Supple Neck,No JVD,.  Lungs-  CTAB , good air movement bilaterally  CV- S1, S2 normal, irregular Abd-  +ve B.Sounds, Abd Soft, No tenderness,    Extremity/Skin:- No  edema,   good pulses Psych-affect is appropriate, more oriented, more coherent Neuro-generalized weakness, no new focal deficits, no tremors    Data Review   CBC w Diff:  Lab Results  Component Value Date   WBC 5.3 04/13/2021   HGB 8.8 (L) 04/13/2021   HCT 30.7 (L) 04/13/2021   PLT 105 (L) 04/13/2021   LYMPHOPCT 27 04/08/2021   MONOPCT 6 04/08/2021   EOSPCT 2 04/08/2021   BASOPCT 0  04/08/2021    CMP:  Lab Results  Component Value Date   NA 137 04/13/2021   K 4.4 04/13/2021   CL 109 04/13/2021   CO2 21 (L) 04/13/2021   BUN 54 (H) 04/13/2021   CREATININE 6.74 (H) 04/13/2021   PROT 7.8 04/08/2021   ALBUMIN 2.3 (L) 04/13/2021   BILITOT 0.6 04/08/2021   ALKPHOS 83 04/08/2021   AST 24 04/08/2021   ALT 27 04/08/2021  .   Total Discharge time is about 33 minutes  Roxan Hockey M.D on 04/13/2021 at 12:09 PM  Go to [email or web address] -  for contact info  [facility] - Office  [phone]

## 2021-04-13 NOTE — TOC Transition Note (Signed)
Transition of Care [facility]) - CM/SW Discharge Note   Patient Details  Name: Lasheba Scholtz MRN: [id number] Date of Birth: [birth year]  [facility]) CM/SW Contact:  Shade Flood, LCSW Phone Number: 04/13/2021, 1:00 PM   Clinical Narrative:     Pt stable for dc today per MD. Updated admissions at [facility] and they can accept pt today. DC clinical sent electronically. RN to call report. Pt on list for EMS transport. Updated pt's daughter who remains in agreement with the dc plan.  There are no other TOC needs for dc.  Final next level of care: Skilled Nursing Facility Barriers to Discharge: Barriers Resolved   Patient Goals and CMS Choice Patient states their goals for this hospitalization and ongoing recovery are:: return home      Discharge Placement              Patient chooses bed at: [facility] Patient to be transferred to facility by: EMS Name of family member notified: [place] Patient and family notified of of transfer: 04/13/21  Discharge Plan and Services In-house Referral: Clinical Social Work   Post Acute Care Choice: Home Health          DME Arranged: N/A                    Social Determinants of Health (SDOH) Interventions     Readmission Risk Interventions No flowsheet data found.

## 2021-04-14 LAB — CULTURE, BODY FLUID W GRAM STAIN -BOTTLE: Culture: NO GROWTH

## 2021-04-16 ENCOUNTER — Observation Stay (HOSPITAL_COMMUNITY): Payer: Medicare Other

## 2021-04-16 ENCOUNTER — Encounter (HOSPITAL_COMMUNITY): Payer: Self-pay | Admitting: Family Medicine

## 2021-04-16 ENCOUNTER — Inpatient Hospital Stay (HOSPITAL_COMMUNITY)
Admission: AD | Admit: 2021-04-16 | Discharge: 2021-04-19 | DRG: 064 | Disposition: A | Discharge status: Skilled Nursing Facility | Payer: Medicare Other | Source: Other Acute Inpatient Hospital | Attending: Internal Medicine | Admitting: Internal Medicine

## 2021-04-16 DIAGNOSIS — R54 Age-related physical debility: Secondary | ICD-10-CM | POA: Diagnosis present

## 2021-04-16 DIAGNOSIS — I132 Hypertensive heart and chronic kidney disease with heart failure and with stage 5 chronic kidney disease, or end stage renal disease: Secondary | ICD-10-CM | POA: Diagnosis present

## 2021-04-16 DIAGNOSIS — Z20822 Contact with and (suspected) exposure to covid-19: Secondary | ICD-10-CM | POA: Diagnosis present

## 2021-04-16 DIAGNOSIS — D631 Anemia in chronic kidney disease: Secondary | ICD-10-CM | POA: Diagnosis present

## 2021-04-16 DIAGNOSIS — R471 Dysarthria and anarthria: Secondary | ICD-10-CM | POA: Diagnosis present

## 2021-04-16 DIAGNOSIS — I10 Essential (primary) hypertension: Secondary | ICD-10-CM | POA: Diagnosis present

## 2021-04-16 DIAGNOSIS — I480 Paroxysmal atrial fibrillation: Secondary | ICD-10-CM | POA: Diagnosis present

## 2021-04-16 DIAGNOSIS — R29716 NIHSS score 16: Secondary | ICD-10-CM | POA: Diagnosis present

## 2021-04-16 DIAGNOSIS — I639 Cerebral infarction, unspecified: Secondary | ICD-10-CM | POA: Diagnosis not present

## 2021-04-16 DIAGNOSIS — Z7189 Other specified counseling: Secondary | ICD-10-CM | POA: Diagnosis not present

## 2021-04-16 DIAGNOSIS — G9341 Metabolic encephalopathy: Secondary | ICD-10-CM

## 2021-04-16 DIAGNOSIS — R29709 NIHSS score 9: Secondary | ICD-10-CM | POA: Diagnosis not present

## 2021-04-16 DIAGNOSIS — Z992 Dependence on renal dialysis: Secondary | ICD-10-CM

## 2021-04-16 DIAGNOSIS — G928 Other toxic encephalopathy: Secondary | ICD-10-CM | POA: Diagnosis present

## 2021-04-16 DIAGNOSIS — Z90710 Acquired absence of both cervix and uterus: Secondary | ICD-10-CM

## 2021-04-16 DIAGNOSIS — G8314 Monoplegia of lower limb affecting left nondominant side: Secondary | ICD-10-CM | POA: Diagnosis present

## 2021-04-16 DIAGNOSIS — Z6832 Body mass index (BMI) 32.0-32.9, adult: Secondary | ICD-10-CM

## 2021-04-16 DIAGNOSIS — R29708 NIHSS score 8: Secondary | ICD-10-CM | POA: Diagnosis not present

## 2021-04-16 DIAGNOSIS — E1151 Type 2 diabetes mellitus with diabetic peripheral angiopathy without gangrene: Secondary | ICD-10-CM | POA: Diagnosis not present

## 2021-04-16 DIAGNOSIS — R29712 NIHSS score 12: Secondary | ICD-10-CM | POA: Diagnosis present

## 2021-04-16 DIAGNOSIS — I63421 Cerebral infarction due to embolism of right anterior cerebral artery: Principal | ICD-10-CM | POA: Diagnosis present

## 2021-04-16 DIAGNOSIS — N186 End stage renal disease: Secondary | ICD-10-CM | POA: Diagnosis not present

## 2021-04-16 DIAGNOSIS — Z794 Long term (current) use of insulin: Secondary | ICD-10-CM

## 2021-04-16 DIAGNOSIS — Z515 Encounter for palliative care: Secondary | ICD-10-CM

## 2021-04-16 DIAGNOSIS — Z66 Do not resuscitate: Secondary | ICD-10-CM | POA: Diagnosis present

## 2021-04-16 DIAGNOSIS — R29714 NIHSS score 14: Secondary | ICD-10-CM | POA: Diagnosis not present

## 2021-04-16 DIAGNOSIS — Z79899 Other long term (current) drug therapy: Secondary | ICD-10-CM

## 2021-04-16 DIAGNOSIS — I5032 Chronic diastolic (congestive) heart failure: Secondary | ICD-10-CM | POA: Diagnosis present

## 2021-04-16 DIAGNOSIS — R29898 Other symptoms and signs involving the musculoskeletal system: Secondary | ICD-10-CM

## 2021-04-16 DIAGNOSIS — I2510 Atherosclerotic heart disease of native coronary artery without angina pectoris: Secondary | ICD-10-CM | POA: Diagnosis present

## 2021-04-16 DIAGNOSIS — E669 Obesity, unspecified: Secondary | ICD-10-CM | POA: Diagnosis present

## 2021-04-16 DIAGNOSIS — Z9115 Patient's noncompliance with renal dialysis: Secondary | ICD-10-CM

## 2021-04-16 DIAGNOSIS — J45909 Unspecified asthma, uncomplicated: Secondary | ICD-10-CM | POA: Diagnosis present

## 2021-04-16 DIAGNOSIS — Z7901 Long term (current) use of anticoagulants: Secondary | ICD-10-CM

## 2021-04-16 DIAGNOSIS — I63443 Cerebral infarction due to embolism of bilateral cerebellar arteries: Secondary | ICD-10-CM | POA: Diagnosis present

## 2021-04-16 DIAGNOSIS — E1122 Type 2 diabetes mellitus with diabetic chronic kidney disease: Secondary | ICD-10-CM | POA: Diagnosis present

## 2021-04-16 LAB — COMPREHENSIVE METABOLIC PANEL
ALT: 15 U/L (ref 0–44)
AST: 23 U/L (ref 15–41)
Albumin: 2 g/dL — ABNORMAL LOW (ref 3.5–5.0)
Alkaline Phosphatase: 63 U/L (ref 38–126)
Anion gap: 9 (ref 5–15)
BUN: 53 mg/dL — ABNORMAL HIGH (ref 8–23)
CO2: 22 mmol/L (ref 22–32)
Calcium: 7.8 mg/dL — ABNORMAL LOW (ref 8.9–10.3)
Chloride: 105 mmol/L (ref 98–111)
Creatinine, Ser: 7 mg/dL — ABNORMAL HIGH (ref 0.44–1.00)
GFR, Estimated: 6 mL/min — ABNORMAL LOW (ref >60)
Glucose, Bld: 89 mg/dL (ref 70–99)
Potassium: 4.8 mmol/L (ref 3.5–5.1)
Sodium: 136 mmol/L (ref 135–145)
Total Bilirubin: 0.7 mg/dL (ref 0.3–1.2)
Total Protein: 6.7 g/dL (ref 6.5–8.1)

## 2021-04-16 LAB — CBG MONITORING, ED
Glucose-Capillary: 63 mg/dL — ABNORMAL LOW (ref 70–99)
Glucose-Capillary: 124 mg/dL — ABNORMAL HIGH (ref 70–99)
Glucose-Capillary: 88 mg/dL (ref 70–99)

## 2021-04-16 LAB — MAGNESIUM: Magnesium: 1.8 mg/dL (ref 1.7–2.4)

## 2021-04-16 LAB — CBC
HCT: 28.8 % — ABNORMAL LOW (ref 36.0–46.0)
Hemoglobin: 8.3 g/dL — ABNORMAL LOW (ref 12.0–15.0)
MCH: 26.1 pg (ref 26.0–34.0)
MCHC: 28.8 g/dL — ABNORMAL LOW (ref 30.0–36.0)
MCV: 90.6 fL (ref 80.0–100.0)
Platelets: 165 10*3/uL (ref 150–400)
RBC: 3.18 MIL/uL — ABNORMAL LOW (ref 3.87–5.11)
RDW: 18.1 % — ABNORMAL HIGH (ref 11.5–15.5)
WBC: 5.6 10*3/uL (ref 4.0–10.5)
nRBC: 0 % (ref 0.0–0.2)

## 2021-04-16 LAB — GLUCOSE, CAPILLARY
Glucose-Capillary: 112 mg/dL — ABNORMAL HIGH (ref 70–99)
Glucose-Capillary: 151 mg/dL — ABNORMAL HIGH (ref 70–99)

## 2021-04-16 LAB — RESP PANEL BY RT-PCR (FLU A&B, COVID) ARPGX2
Influenza A by PCR: NEGATIVE
Influenza B by PCR: NEGATIVE
SARS Coronavirus 2 by RT PCR: NEGATIVE

## 2021-04-16 LAB — PHOSPHORUS: Phosphorus: 6 mg/dL — ABNORMAL HIGH (ref 2.5–4.6)

## 2021-04-16 IMAGING — MR MR HEAD W/O CM
8 series · 48 of 48 positions shown · non-contrast
Comparison: [DATE]

CLINICAL DATA: Neuro deficit, acute, stroke suspected.

EXAM:
MRI HEAD WITHOUT CONTRAST
TECHNIQUE: Multiplanar, multiecho pulse sequences of the brain and surrounding
structures were obtained without intravenous contrast.

[Series 5: DWI · axial · 3.0mm · 0.88mm/px · z∈[-144,+3]mm · 15 of 102 slices shown (1 of 4)]
[im 1/102]
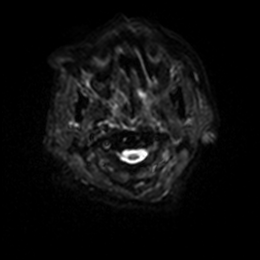
[im 8/102]
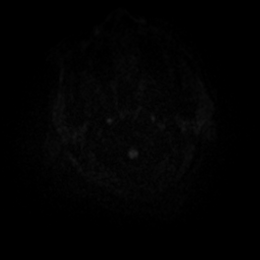
[im 15/102]
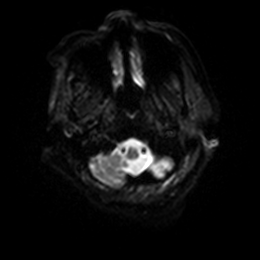
[im 22/102]
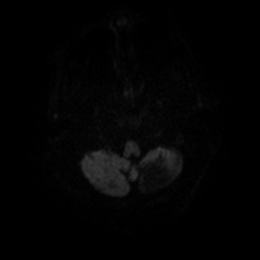
[im 29/102]
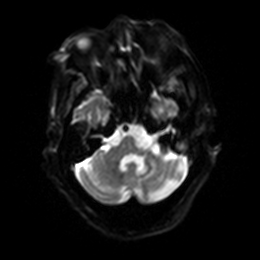
[im 37/102]
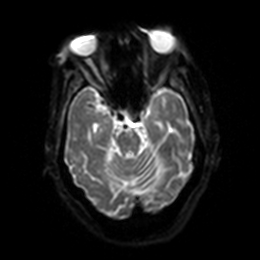
[im 44/102]
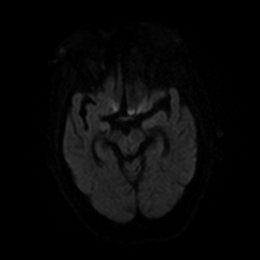
[im 51/102]
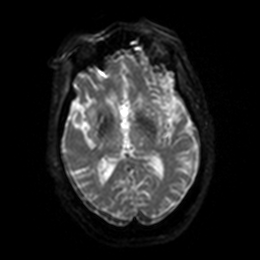
[im 58/102]
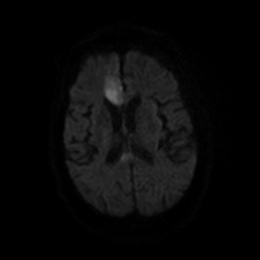
[im 65/102]
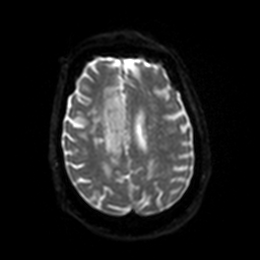
[im 73/102]
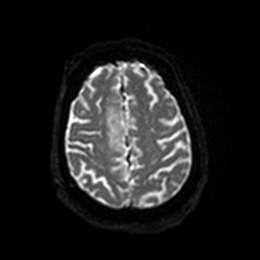
[im 80/102]
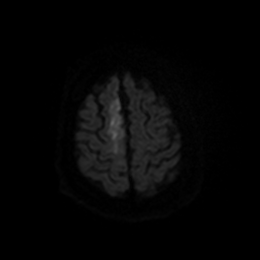
[im 87/102]
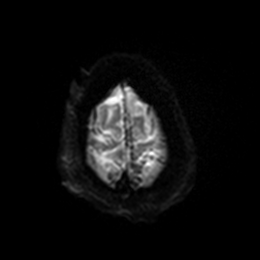
[im 94/102]
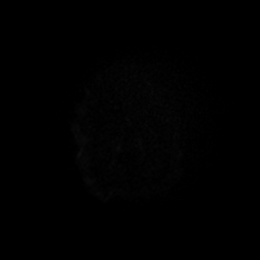
[im 102/102]
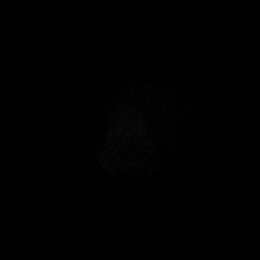

[Series 6: DWI · axial · 3.0mm · 0.88mm/px · z∈[-144,+3]mm · 7 of 51 slices shown (2 of 4)]
[im 1/51]
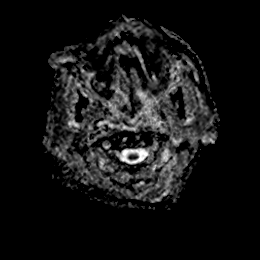
[im 9/51]
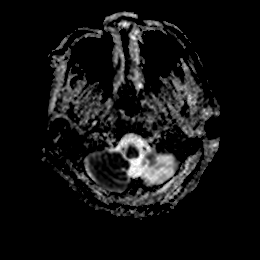
[im 17/51]
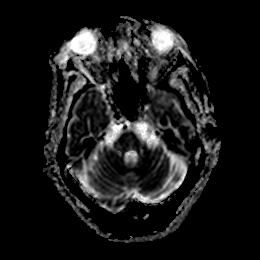
[im 26/51]
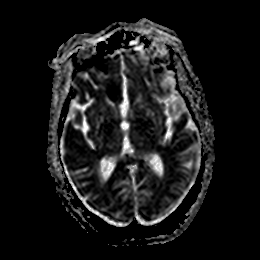
[im 34/51]
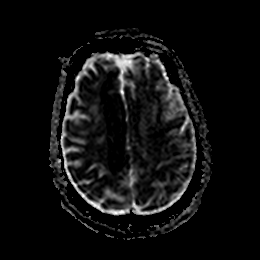
[im 42/51]
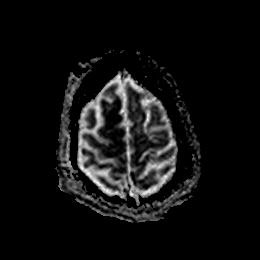
[im 51/51]
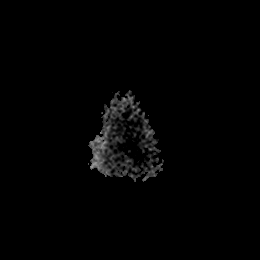

[Series 7: DWI · coronal · 4.0mm · 0.88mm/px · 9 of 68 slices shown (3 of 4)]
[im 1/68]
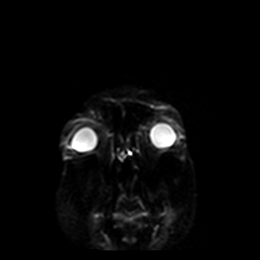
[im 9/68]
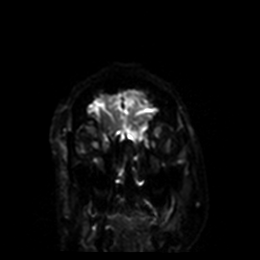
[im 17/68]
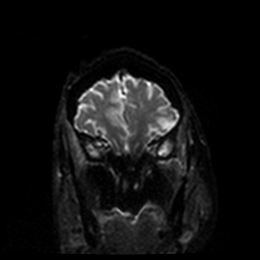
[im 26/68]
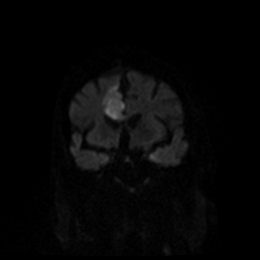
[im 34/68]
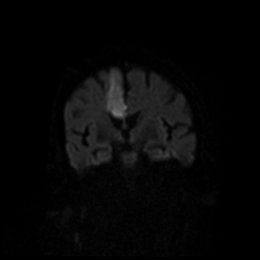
[im 42/68]
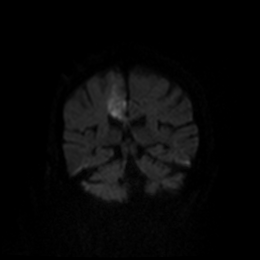
[im 51/68]
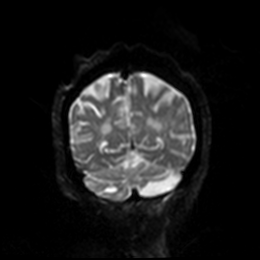
[im 59/68]
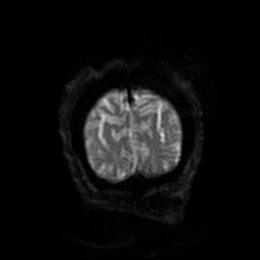
[im 68/68]
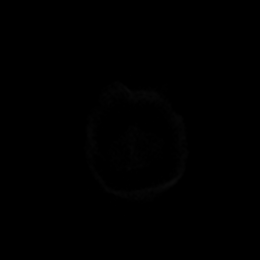

[Series 8: DWI · coronal · 4.0mm · 0.88mm/px · 5 of 34 slices shown (4 of 4)]
[im 1/34]
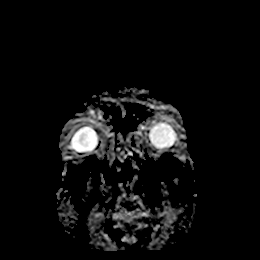
[im 9/34]
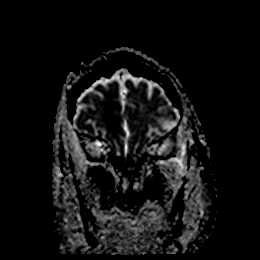
[im 17/34]
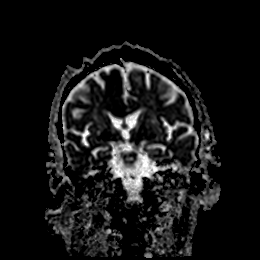
[im 25/34]
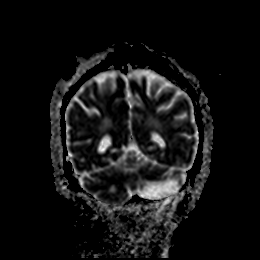
[im 34/34]
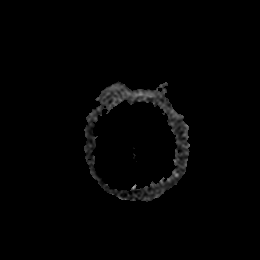

[Series 9: T1 · sagittal · 5.0mm · 0.75mm/px · 3 of 23 slices shown (1 of 2)]
[im 1/23]
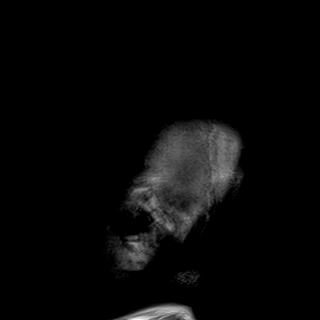
[im 12/23]
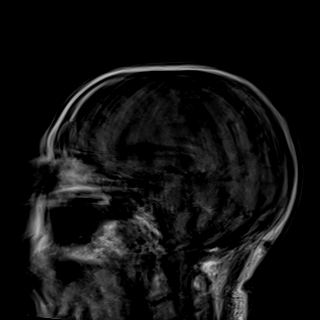
[im 23/23]
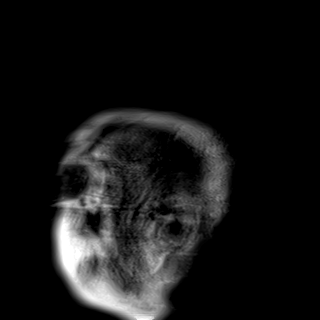

[Series 10: T2 · axial · 5.0mm · 0.72mm/px · z∈[-142,-1]mm · 3 of 25 slices shown]
[im 1/25]
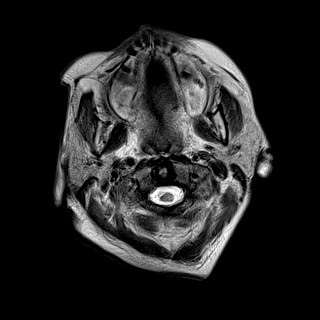
[im 13/25]
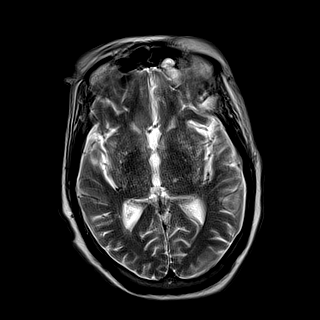
[im 25/25]
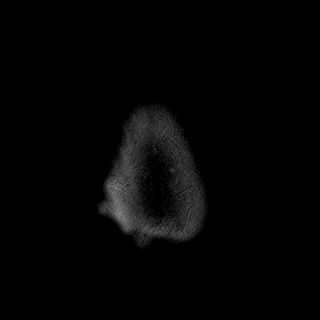

[Series 11: FLAIR · axial · 5.0mm · 0.90mm/px · z∈[-142,-1]mm · 3 of 25 slices shown]
[im 1/25]
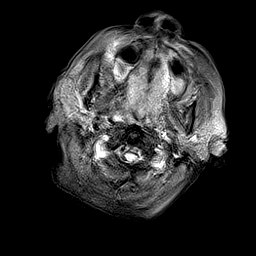
[im 13/25]
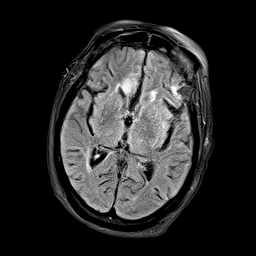
[im 25/25]
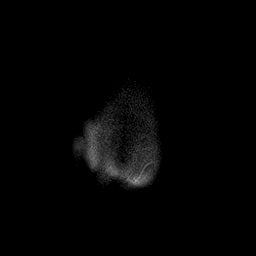

[Series 12: T1 · sagittal · 5.0mm · 0.90mm/px · 3 of 23 slices shown (2 of 2)]
[im 1/23]
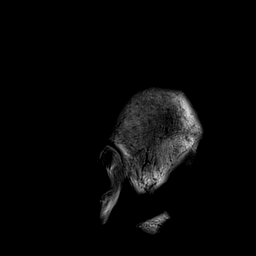
[im 12/23]
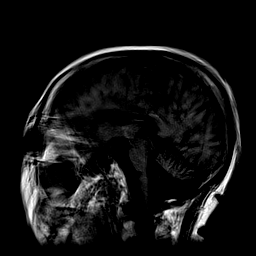
[im 23/23]
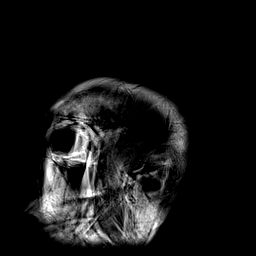

[48 of 48 positions shown; findings below may reference images not displayed]

FINDINGS: The study is severely motion degraded and was terminated prior to
completion. Axial and coronal diffusion, sagittal T1, axial T2, and
axial FLAIR sequences were obtained.

Brain: A large subacute right ACA infarct is unchanged in extent
from the prior MRI. Associated cytotoxic edema has increased without
significant mass effect. No new infarct is identified.
Encephalomalacia is again noted anteriorly in the left frontal lobe
and in the left greater than right cerebellar hemispheres. Chronic
small vessel ischemia is again noted in the cerebral white matter
bilaterally with chronic lacunar infarcts in the basal ganglia, left
thalamus, and pons. No gross intracranial hemorrhage, midline shift,
or extra-axial fluid collection is identified. There is mild
cerebral atrophy.

Vascular: Major intracranial arterial flow voids are grossly
preserved.

Skull and upper cervical spine: No gross skull lesion.

Sinuses/Orbits: Bilateral cataract extraction. Mild mucosal
thickening in the paranasal sinuses. Left mastoid effusion.

Other: None.
IMPRESSION: 1. Severely motion degraded, incomplete examination.
2. Unchanged extent of subacute right ACA infarct.
3. No new infarct identified.
4. Chronic small vessel ischemia with multiple chronic lacunar
infarcts as above.

## 2021-04-16 IMAGING — DX DG CHEST 1V PORT
1 series · 1 of 1 positions shown · non-contrast
Comparison: [DATE]

CLINICAL DATA: In stage renal disease.

EXAM:
PORTABLE CHEST 1 VIEW

[chest ap]
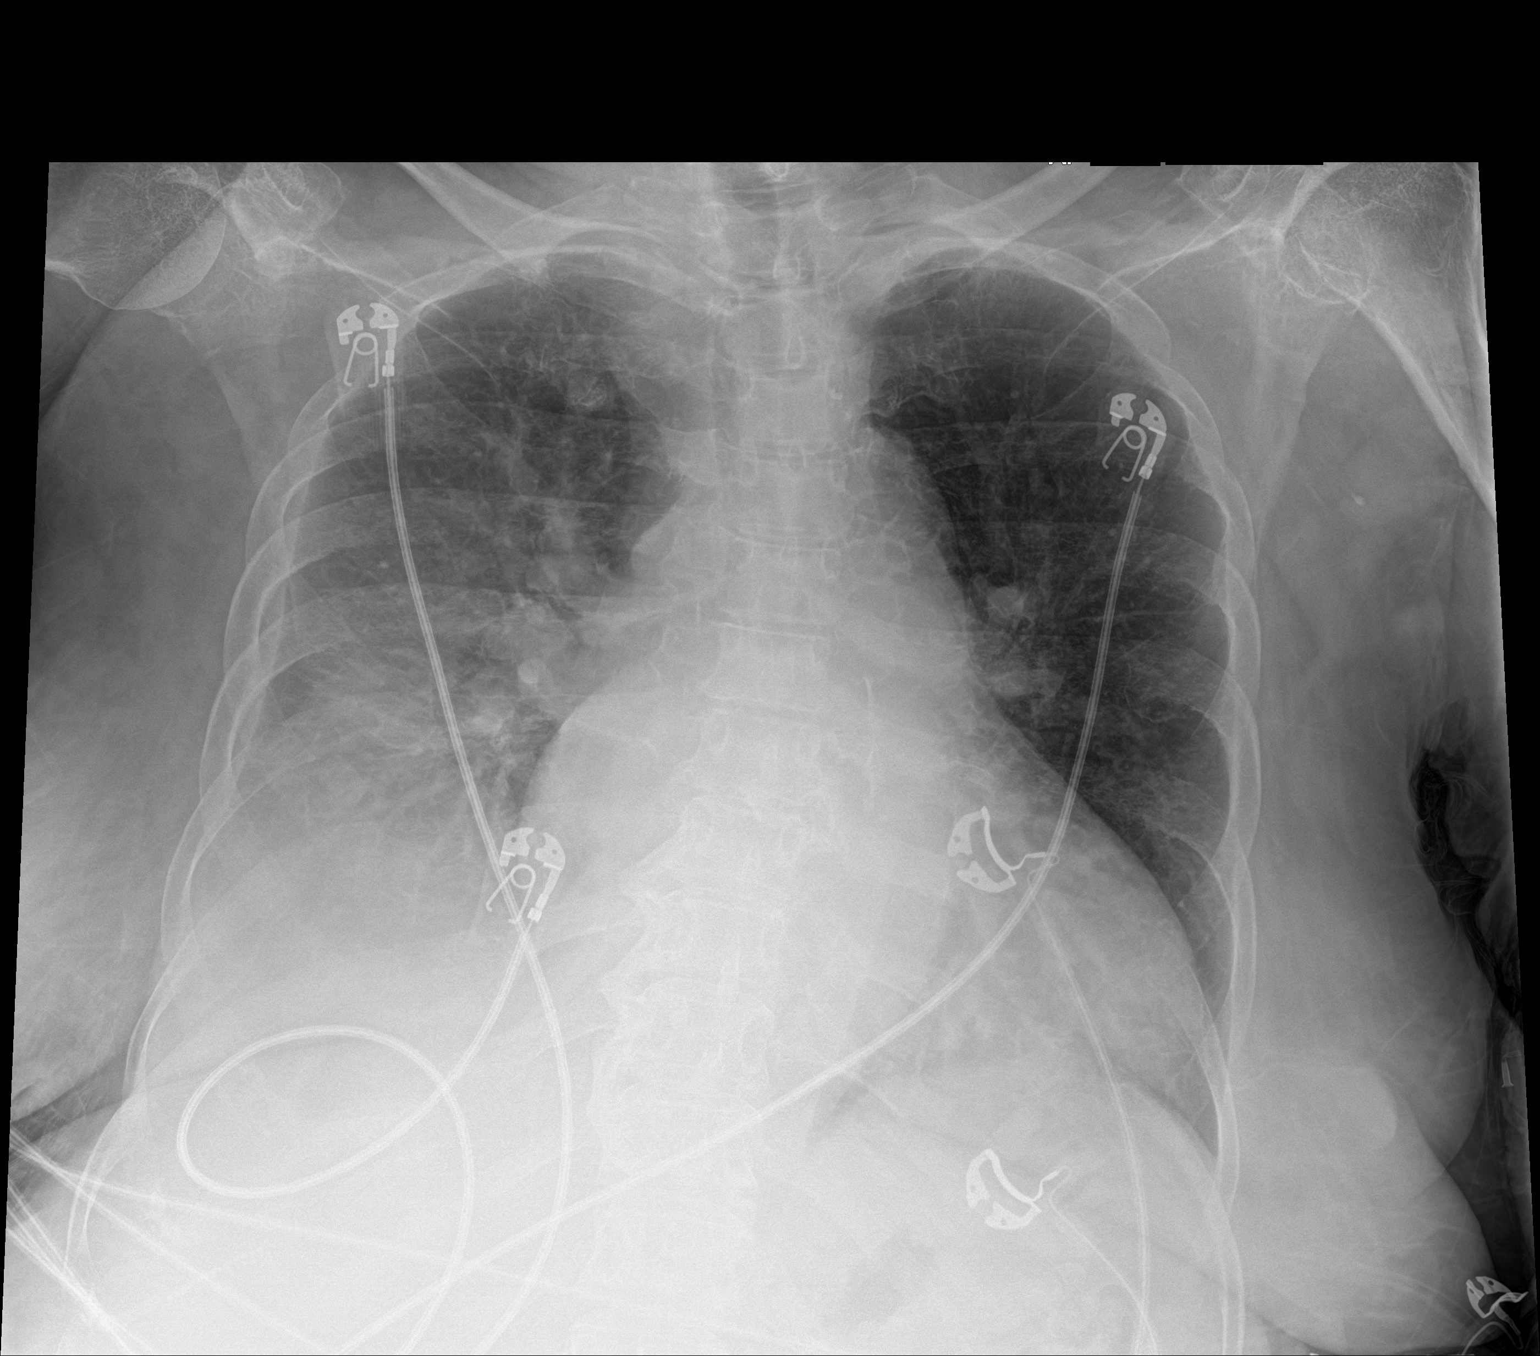

[1 of 1 positions shown; findings below may reference images not displayed]

FINDINGS: The cardiac silhouette remains enlarged. Aortic atherosclerosis is
noted. New opacity in the right mid and lower lung is compatible
with reaccumulation of a moderate-sized pleural effusion with
atelectasis or consolidation. The left lung is grossly clear. No
pneumothorax is identified. No acute osseous abnormality is seen.
IMPRESSION: Reaccumulation of moderate right pleural effusion with right basilar
atelectasis or consolidation.

## 2021-04-16 MED ORDER — AMLODIPINE BESYLATE 10 MG PO TABS
10.0000 mg | ORAL_TABLET | Freq: Every day | ORAL | Status: DC
  Administered 2021-04-16: 10 mg via ORAL
  Filled 2021-04-16 (×2): qty 1

## 2021-04-16 MED ORDER — STROKE: EARLY STAGES OF RECOVERY BOOK
Freq: Once | Status: DC
  Filled 2021-04-16: qty 1

## 2021-04-16 MED ORDER — SEVELAMER CARBONATE 800 MG PO TABS
800.0000 mg | ORAL_TABLET | Freq: Three times a day (TID) | ORAL | Status: DC
  Administered 2021-04-16 – 2021-04-19 (×6): 800 mg via ORAL
  Filled 2021-04-16 (×8): qty 1

## 2021-04-16 MED ORDER — ACETAMINOPHEN 325 MG PO TABS
650.0000 mg | ORAL_TABLET | Freq: Four times a day (QID) | ORAL | Status: DC | PRN
  Administered 2021-04-16 – 2021-04-18 (×4): 650 mg via ORAL
  Filled 2021-04-16 (×4): qty 2

## 2021-04-16 MED ORDER — METOPROLOL SUCCINATE ER 100 MG PO TB24
100.0000 mg | ORAL_TABLET | Freq: Every day | ORAL | Status: DC
  Administered 2021-04-16 – 2021-04-19 (×3): 100 mg via ORAL
  Filled 2021-04-16 (×4): qty 1

## 2021-04-16 MED ORDER — FUROSEMIDE 40 MG PO TABS
80.0000 mg | ORAL_TABLET | Freq: Every day | ORAL | Status: DC
  Administered 2021-04-16 – 2021-04-19 (×3): 80 mg via ORAL
  Filled 2021-04-16: qty 4
  Filled 2021-04-16 (×3): qty 2

## 2021-04-16 MED ORDER — PANTOPRAZOLE SODIUM 40 MG PO TBEC
40.0000 mg | DELAYED_RELEASE_TABLET | Freq: Every day | ORAL | Status: DC
  Administered 2021-04-16 – 2021-04-19 (×3): 40 mg via ORAL
  Filled 2021-04-16 (×4): qty 1

## 2021-04-16 MED ORDER — SODIUM BICARBONATE 650 MG PO TABS
1300.0000 mg | ORAL_TABLET | Freq: Two times a day (BID) | ORAL | Status: DC
  Administered 2021-04-16 – 2021-04-19 (×6): 1300 mg via ORAL
  Filled 2021-04-16 (×7): qty 2

## 2021-04-16 MED ORDER — INSULIN ASPART 100 UNIT/ML IJ SOLN: 0.0000 [IU] | Freq: Three times a day (TID) | INTRAMUSCULAR | Status: DC

## 2021-04-16 MED ORDER — CLONIDINE HCL 0.1 MG PO TABS
0.2000 mg | ORAL_TABLET | Freq: Two times a day (BID) | ORAL | Status: DC
  Administered 2021-04-16: 0.2 mg via ORAL
  Filled 2021-04-16 (×2): qty 2

## 2021-04-16 MED ORDER — HYDRALAZINE HCL 20 MG/ML IJ SOLN: 10.0000 mg | INTRAMUSCULAR | Status: DC | PRN

## 2021-04-16 MED ORDER — HYDRALAZINE HCL 25 MG PO TABS
25.0000 mg | ORAL_TABLET | Freq: Three times a day (TID) | ORAL | Status: DC
  Administered 2021-04-16 – 2021-04-19 (×7): 25 mg via ORAL
  Filled 2021-04-16 (×8): qty 1

## 2021-04-16 MED ORDER — ATORVASTATIN CALCIUM 40 MG PO TABS
40.0000 mg | ORAL_TABLET | Freq: Every day | ORAL | Status: DC
  Administered 2021-04-16 – 2021-04-19 (×3): 40 mg via ORAL
  Filled 2021-04-16 (×4): qty 1

## 2021-04-16 MED ORDER — LORAZEPAM 2 MG/ML IJ SOLN
0.5000 mg | Freq: Once | INTRAMUSCULAR | Status: AC
  Administered 2021-04-16: 0.5 mg via INTRAVENOUS
  Filled 2021-04-16: qty 1

## 2021-04-16 MED ORDER — ACETAMINOPHEN 650 MG RE SUPP: 650.0000 mg | Freq: Four times a day (QID) | RECTAL | Status: DC | PRN

## 2021-04-16 NOTE — Evaluation (Signed)
Occupational Therapy Evaluation Patient Details Name: Tiffany Valencia MRN: [id number] DOB: 28-May-1946 Today's Date: 04/16/2021   History of Present Illness 75 yo female presenting to ED with R sided weakness. MRI negative for acute change. Pt with recent admission at [facility] (discharge on 10/7 to SNF) with new R ACA and L hemiparesis. PMH including end-stage renal disease declining hemodialysis, type 2 diabetes mellitus, hypertension, paroxysmal atrial fibrillation chronically anticoagulated on Eliquis, chronic diastolic heart failure, and recurrent ischemic CVAs.   Clinical Impression   PTA, pt was at [facility] after recent hospital admission for new CVA with L hemiparesis.  Pt currently requiring Max A for ADLs and bed mobility. Pt tolerating sitting at EOB with Max support to swallow pills and water. Pt presenting with lethargy, poor balance, and decreased strength. Pt following simple commands with increased time and cues. Pt would benefit from further acute OT to facilitate safe dc. Recommend dc to SNF for further OT to optimize safety, independence with ADLs, and return to PLOF.      Recommendations for follow up therapy are one component of a multi-disciplinary discharge planning process, led by the attending physician.  Recommendations may be updated based on patient status, additional functional criteria and insurance authorization.   Follow Up Recommendations  SNF    Equipment Recommendations  None recommended by OT    Recommendations for Other Services       Precautions / Restrictions Precautions Precautions: Fall Precaution Comments: L hemiplegia      Mobility Bed Mobility Overal bed mobility: Needs Assistance Bed Mobility: Supine to Sit;Sit to Supine     Supine to sit: Max assist;HOB elevated;+2 for physical assistance Sit to supine: Max assist;+2 for physical assistance   General bed mobility comments: max A for bringing BELs and trunk    Transfers                  General transfer comment: Defer for safety due to lethargy    Balance Overall balance assessment: Needs assistance Sitting-balance support: No upper extremity supported;Feet supported Sitting balance-Leahy Scale: Poor   Postural control: Posterior lean;Right lateral lean                                 ADL either performed or assessed with clinical judgement   ADL Overall ADL's : Needs assistance/impaired   Eating/Feeding Details (indicate cue type and reason): Able to bring cup to mouth for taking medication. Increased time and cues thorughout                                   General ADL Comments: Max A for ADLs and bed mobility     Vision Baseline Vision/History: 1 Wears glasses (reports she wears reading glasses) Patient Visual Report: No change from baseline Vision Assessment?: Yes Additional Comments: Pt presenting with dysconjugate gaze. Denies diplopia     Perception     Praxis      Pertinent Vitals/Pain Pain Assessment: Faces Faces Pain Scale: Hurts little more Pain Location: Buttock - pt reporting two wounds on her bottom Pain Descriptors / Indicators: Sore Pain Intervention(s): Limited activity within patient's tolerance;Monitored during session;Repositioned     Hand Dominance Right   Extremity/Trunk Assessment Upper Extremity Assessment Upper Extremity Assessment: RUE deficits/detail;LUE deficits/detail RUE Deficits / Details: Able to grasp cup and bring to mouth. Weak graso LUE Deficits /  Details: No active movement. increased edema. Good PROM LUE Coordination: decreased fine motor;decreased gross motor   Lower Extremity Assessment Lower Extremity Assessment: Defer to PT evaluation   Cervical / Trunk Assessment Cervical / Trunk Assessment: Kyphotic   Communication Communication Communication: Expressive difficulties   Cognition Arousal/Alertness: Lethargic Behavior During Therapy: Flat affect Overall  Cognitive Status: No family/caregiver present to determine baseline cognitive functioning                                 General Comments: Pt oriented to being at the hospital. Following simple commands. Lethargic and falling asleep throughout session   General Comments       Exercises     Shoulder Instructions      Home Living Family/patient expects to be discharged to:: Skilled nursing facility                                 Additional Comments: Per chart review, pt was SNF. Pt reporting she was living at home and "she" helped her with ADLs      Prior Functioning/Environment Level of Independence: Needs assistance    ADL's / Homemaking Assistance Needed: Pt reporting "she helps me with that" in reference to ADLs. Per chart review, pt's friend assisted with ADLs prior to recent admission on 10/5.            OT Problem List: Decreased strength;Decreased range of motion;Decreased activity tolerance;Impaired balance (sitting and/or standing);Impaired vision/perception;Decreased coordination;Decreased cognition;Impaired tone;Impaired UE functional use      OT Treatment/Interventions: Self-care/ADL training;Therapeutic exercise;Neuromuscular education;DME and/or AE instruction;Manual therapy;Splinting;Therapeutic activities;Cognitive remediation/compensation;Visual/perceptual remediation/compensation;Patient/family education;Balance training    OT Goals(Current goals can be found in the care plan section) Acute Rehab OT Goals Patient Stated Goal: Unstated OT Goal Formulation: With patient Time For Goal Achievement: 04/30/21 Potential to Achieve Goals: Good  OT Frequency: Min 2X/week   Barriers to D/C:            Co-evaluation              AM-PAC OT "6 Clicks" Daily Activity     Outcome Measure Help from another person eating meals?: A Lot Help from another person taking care of personal grooming?: A Lot Help from another person  toileting, which includes using toliet, bedpan, or urinal?: Total Help from another person bathing (including washing, rinsing, drying)?: A Lot Help from another person to put on and taking off regular upper body clothing?: A Lot Help from another person to put on and taking off regular lower body clothing?: Total 6 Click Score: 10   End of Session Nurse Communication: Mobility status  Activity Tolerance: Patient tolerated treatment well Patient left: in bed;with call bell/phone within reach;with bed alarm set  OT Visit Diagnosis: Unsteadiness on feet (R26.81);Other abnormalities of gait and mobility (R26.89);Muscle weakness (generalized) (M62.81);Hemiplegia and hemiparesis Hemiplegia - Right/Left: Left Hemiplegia - dominant/non-dominant: Non-Dominant                Time: [id number] OT Time Calculation (min): 14 min Charges:  OT General Charges $OT Visit: 1 Visit OT Evaluation $OT Eval Moderate Complexity: 1 Mod  Michiel Sivley MSOT, OTR/L Acute Rehab Pager: [phone] Office: [place] 04/16/2021, 2:03 PM

## 2021-04-16 NOTE — Progress Notes (Signed)
  Brief PROGRESS NOTE    Tiffany Valencia  [id number] DOB: April 14, 1946 DOA: 04/16/2021 PCP: Pcp, No   Patient was admitted on day of rounding. Have reviewed patient's chart and examined patient.   Briefly, Tiffany Valencia is 75 y.o. female with PMH of T2DM, HTN, pAFib, chronic diastolic heart failure, and ESRD not on HD who presented as transfer from [facility] ED for suspected acute ischemic CVA. MRI was obtained that was severely motion degraded with incomplete examination--unchanged subacute right ACA infarct without new infarct identified. Neurology has been consulted for the subacute right ACA stroke with residual deficits. PT/OT/SLP consulted.   BP goal of <130/90 recommended by neurology given no longer warrants permissive hypertension given lack of acute CVA. Will resume home medications.   Patient also presenting with metabolic encephalopathy in setting of ESRD. Has refused HD in the past and is a DNR. Palliative care has been consulted. Had long discussion with family members who seemed amenable to option of hospice but would like to consider options further. They asked for nephro consult. Discussed case with Elmarie Shiley., MD who will see in AM. Additional family plans to arrive Wednesday.   Melina Schools, DO [facility] Pager [phone]  If 7PM-7AM, please contact night-coverage [email or web address] Password [email or web address] 04/16/2021, 8:49 AM

## 2021-04-16 NOTE — ED Triage Notes (Signed)
Pt BIB [facility] from [facility] for MRI. Pt c/o new R sided weakness, confusion, and blurred vision. Pt had previous stroke one week ago and CT today showed a new stroke. Pt on Eliquis. 20G R hand and 18GL upper arm placed PTA. GCS 14.

## 2021-04-16 NOTE — ED Provider Notes (Signed)
[place] EMERGENCY DEPARTMENT Provider Note   CSN: [id number] Arrival date & time: 04/16/21  0027     History Chief Complaint  Patient presents with   Cerebrovascular Accident    Tiffany Valencia is a 75 y.o. female.   Cerebrovascular Accident This is a new problem. The current episode started 6 to 12 hours ago. The problem occurs constantly. The problem has not changed since onset.Associated symptoms include headaches. Pertinent negatives include no chest pain, no abdominal pain and no shortness of breath.      Past Medical History:  Diagnosis Date   Anemia in chronic kidney disease (CKD)    Asthma    Chronic kidney disease    Coronary artery disease    Diabetes mellitus without complication ([place])    Hypertension     Patient Active Problem List   Diagnosis Date Noted   Pressure injury of skin 04/12/2021   AKI (acute kidney injury) ([place])    Cerebrovascular accident (CVA) due to embolism of right anterior cerebral artery ([place])    Acute heart failure ([place]) 04/08/2021   Gait instability 04/08/2021   DM (diabetes mellitus), type 2 with peripheral vascular complications ([place]) [id number]   Benign hypertension with CKD (chronic kidney disease) stage IV ([place]) 04/08/2021   Acquired thrombophilia ([place]) 04/08/2021   Chronic anticoagulation 04/08/2021   presumed Paroxysmal atrial fibrillation 04/08/2021   Hypertensive urgency 04/08/2021   Anemia in chronic kidney disease (CKD)    Pleural effusion on right    Altered mental state    Volume overload    Metabolic acidosis    Elevated brain natriuretic peptide (BNP) level    Hypoalbuminemia    Dysarthria    Coronary artery disease s/p STENT    Acute respiratory failure with hypoxia ([place])     Past Surgical History:  Procedure Laterality Date   ABDOMINAL HYSTERECTOMY     BACK SURGERY       OB History   No obstetric history on file.     History reviewed. No pertinent family history.  Social  History   Tobacco Use   Smoking status: Never   Smokeless tobacco: Never  Vaping Use   Vaping Use: Never used  Substance Use Topics   Alcohol use: Not Currently   Drug use: Not Currently    Home Medications Prior to Admission medications   Medication Sig Start Date End Date Taking? Authorizing Provider  acetaminophen (TYLENOL) 325 MG tablet Take 2 tablets (650 mg total) by mouth every 6 (six) hours as needed for mild pain (or Fever >/= 101). 04/13/21   Roxan Hockey, MD  albuterol (VENTOLIN HFA) 108 (90 Base) MCG/ACT inhaler Inhale into the lungs. 02/03/21   [provider]  amLODipine (NORVASC) 10 MG tablet Take 10 mg by mouth daily. 01/25/21   [provider]  atorvastatin (LIPITOR) 80 MG tablet Take 40 mg by mouth daily. 12/26/20   [provider]  cloNIDine (CATAPRES) 0.2 MG tablet Take 1 tablet (0.2 mg total) by mouth 2 (two) times daily. 04/13/21   Emokpae, Courage, MD  ELIQUIS 2.5 MG TABS tablet Take 1 tablet (2.5 mg total) by mouth 2 (two) times daily. 04/13/21   Roxan Hockey, MD  folic acid (FOLVITE) 1 MG tablet Take 1 mg by mouth daily. Patient not taking: No sig reported 02/08/21   [provider]  furosemide (LASIX) 80 MG tablet Take 1 tablet (80 mg total) by mouth daily. 04/14/21   Roxan Hockey, MD  hydrALAZINE (APRESOLINE)  50 MG tablet Take 1 tablet (50 mg total) by mouth 3 (three) times daily. 04/13/21   Roxan Hockey, MD  insulin aspart (NOVOLOG) 100 UNIT/ML FlexPen Inject 0-10 Units into the skin 3 (three) times daily with meals. insulin aspart (novoLOG) injection 0-10 Units 0-10 Units Subcutaneous, 3 times daily with meals CBG < 70: Implement Hypoglycemia Standing Orders and refer to Hypoglycemia Standing Orders sidebar report  CBG 70 - 120: 0 unit CBG 121 - 150: 0 unit  CBG 151 - 200: 1 unit CBG 201 - 250: 2 units CBG 251 - 300: 4 units CBG 301 - 350: 6 units  CBG 351 - 400: 8 units  CBG > 400: 10 units 04/13/21   Emokpae, Courage, MD   isosorbide mononitrate (IMDUR) 30 MG 24 hr tablet Take 1 tablet (30 mg total) by mouth daily. 04/14/21   Roxan Hockey, MD  LANTUS SOLOSTAR 100 UNIT/ML Solostar Pen Inject 4 Units into the skin at bedtime. 04/13/21   Roxan Hockey, MD  metoprolol succinate (TOPROL-XL) 100 MG 24 hr tablet Take 1 tablet (100 mg total) by mouth daily. 04/13/21   Roxan Hockey, MD  mirtazapine (REMERON) 7.5 MG tablet Take 7.5 mg by mouth at bedtime. 12/29/20   [provider]  Nystatin (GERHARDT'S BUTT CREAM) CREA Apply 1 application topically 3 (three) times daily. Apply to buttocks/perineum 04/13/21   Roxan Hockey, MD  ondansetron (ZOFRAN) 4 MG tablet Take 1 tablet (4 mg total) by mouth every 6 (six) hours as needed for nausea. 04/13/21   Roxan Hockey, MD  oxyCODONE (OXY IR/ROXICODONE) 5 MG immediate release tablet Take 1 tablet (5 mg total) by mouth every 6 (six) hours as needed for up to 5 days for severe pain. 04/13/21 04/18/21  Roxan Hockey, MD  pantoprazole (PROTONIX) 40 MG tablet Take 1 tablet (40 mg total) by mouth daily. 04/14/21   Roxan Hockey, MD  senna-docusate (SENOKOT-S) 8.6-50 MG tablet Take 2 tablets by mouth at bedtime. 04/13/21 04/13/22  Roxan Hockey, MD  sevelamer carbonate (RENVELA) 800 MG tablet Take 1 tablet (800 mg total) by mouth 3 (three) times daily with meals. 04/13/21   Roxan Hockey, MD  sodium bicarbonate 650 MG tablet Take 2 tablets (1,300 mg total) by mouth 2 (two) times daily. 04/13/21   Roxan Hockey, MD  triamcinolone cream (KENALOG) 0.1 % SMARTSIG:1 Application Topical 2-3 Times Daily 03/21/21   [provider]  Vitamin D, Ergocalciferol, (DRISDOL) 1.25 MG (50000 UNIT) CAPS capsule Take by mouth. Patient not taking: No sig reported 12/29/20   [provider]    Allergies    Patient has no known allergies.  Review of Systems   Review of Systems  Respiratory:  Negative for shortness of breath.   Cardiovascular:  Negative for chest  pain.  Gastrointestinal:  Negative for abdominal pain.  Neurological:  Positive for headaches.  All other systems reviewed and are negative.  Physical Exam Updated Vital Signs BP (!) 191/72   Pulse 64   Resp 15   SpO2 97%   Physical Exam Vitals and nursing note reviewed.  Constitutional:      Appearance: She is well-developed.  HENT:     Head: Normocephalic and atraumatic.  Eyes:     Conjunctiva/sclera: Conjunctivae normal.     Pupils: Pupils are equal, round, and reactive to light.  Cardiovascular:     Rate and Rhythm: Normal rate and regular rhythm.  Pulmonary:     Effort: No respiratory distress.     Breath sounds:  No stridor.  Abdominal:     General: Abdomen is flat. There is no distension.  Musculoskeletal:        General: No swelling or tenderness. Normal range of motion.     Cervical back: Normal range of motion.  Skin:    General: Skin is warm and dry.  Neurological:     Mental Status: She is alert.     Comments: Appears to have a left gaze deficit and weakness    ED Results / Procedures / Treatments   Labs (all labs ordered are listed, but only abnormal results are displayed) Labs Reviewed - No data to display  EKG None  Radiology No results found.  Procedures Procedures   Medications Ordered in ED Medications - No data to display  ED Course  I have reviewed the triage vital signs and the nursing notes.  Pertinent labs & imaging results that were available during my care of the patient were reviewed by me and considered in my medical decision making (see chart for details).    MDM Rules/Calculators/A&P                         [facility] after [facility] refused [facility] 2/2 'continuity of care'. Was recently admitted to [facility] for CHF, had a stroke, not a candidate for thrombolytics as she was already on anticoaulgants. D/c to SNF. Apparently LKN was 1830 and now with new L sided deficits. Went to OSH found to have new acute stroke.  Apparently d/w our hosptialist (and maybe neurologist?) Here and accepted for admission.   Final Clinical Impression(s) / ED Diagnoses Final diagnoses:  None    Rx / DC Orders ED Discharge Orders     None        Albertia Carvin, Corene Cornea, MD 04/16/21 [phone]

## 2021-04-16 NOTE — Consult Note (Signed)
Neurology Consult H&P  Tiffany Valencia MR# [id number] 04/16/2021  CC: right lower extremity weakness  History is obtained from: chart  HPI: Tiffany Valencia is a 75 y.o. female PMHx ESRD not on HD, DM 2, HTN, paroxysmal atrial fibrillation, chronically anticoagulated (apixaban), chronic diastolic heart failure, recurrent ischemic CVAs, transferred from OSH for acute right lower extremity weakness.    to [facility] on 04/16/2021 by way of transfer from [place] [facility] ED for further evaluation and management of potential acute ischemic CVA after presenting from SNF to the latter facility complaining of    LKW: 1830 on 04/15/2021  tNK given: No outside window IR Thrombectomy Not candidate Modified Rankin Scale: 1-No significant post stroke disability and can perform usual duties with stroke symptoms NIHSS: 12 LOC Responsiveness 2 LOC Questions 1 LOC Commands 1 Horizontal eye movement 0 Visual field 0 Facial palsy 0 Motor arm - Right arm 0 Motor arm - Left arm 0 Motor leg - Right leg 2 Motor leg - Left leg 4 Limb ataxia 2 Sensory test 0 Language 0 Speech 0 Extinction and inattention 0  ROS: Unable to assess due to encephalopathy.  Past Medical History:  Diagnosis Date   Anemia in chronic kidney disease (CKD)    Asthma    Chronic kidney disease    Coronary artery disease    Diabetes mellitus without complication ([place])    Hypertension    History reviewed. No pertinent family history.  Social History:  reports that she has never smoked. She has never used smokeless tobacco. She reports that she does not currently use alcohol. She reports that she does not currently use drugs.  Prior to Admission medications   Medication Sig Start Date End Date Taking? Authorizing Provider  acetaminophen (TYLENOL) 325 MG tablet Take 2 tablets (650 mg total) by mouth every 6 (six) hours as needed for mild pain (or Fever >/= 101). 04/13/21   Roxan Hockey, MD  albuterol  (VENTOLIN HFA) 108 (90 Base) MCG/ACT inhaler Inhale into the lungs. 02/03/21   [provider]  amLODipine (NORVASC) 10 MG tablet Take 10 mg by mouth daily. 01/25/21   [provider]  atorvastatin (LIPITOR) 80 MG tablet Take 40 mg by mouth daily. 12/26/20   [provider]  cloNIDine (CATAPRES) 0.2 MG tablet Take 1 tablet (0.2 mg total) by mouth 2 (two) times daily. 04/13/21   Emokpae, Courage, MD  ELIQUIS 2.5 MG TABS tablet Take 1 tablet (2.5 mg total) by mouth 2 (two) times daily. 04/13/21   Roxan Hockey, MD  folic acid (FOLVITE) 1 MG tablet Take 1 mg by mouth daily. Patient not taking: No sig reported 02/08/21   [provider]  furosemide (LASIX) 80 MG tablet Take 1 tablet (80 mg total) by mouth daily. 04/14/21   Roxan Hockey, MD  hydrALAZINE (APRESOLINE) 50 MG tablet Take 1 tablet (50 mg total) by mouth 3 (three) times daily. 04/13/21   Roxan Hockey, MD  insulin aspart (NOVOLOG) 100 UNIT/ML FlexPen Inject 0-10 Units into the skin 3 (three) times daily with meals. insulin aspart (novoLOG) injection 0-10 Units 0-10 Units Subcutaneous, 3 times daily with meals CBG < 70: Implement Hypoglycemia Standing Orders and refer to Hypoglycemia Standing Orders sidebar report  CBG 70 - 120: 0 unit CBG 121 - 150: 0 unit  CBG 151 - 200: 1 unit CBG 201 - 250: 2 units CBG 251 - 300: 4 units CBG 301 - 350: 6 units  CBG 351 - 400: 8 units  CBG > 400: 10 units 04/13/21   Emokpae, Courage, MD  isosorbide mononitrate (IMDUR) 30 MG 24 hr tablet Take 1 tablet (30 mg total) by mouth daily. 04/14/21   Roxan Hockey, MD  LANTUS SOLOSTAR 100 UNIT/ML Solostar Pen Inject 4 Units into the skin at bedtime. 04/13/21   Roxan Hockey, MD  metoprolol succinate (TOPROL-XL) 100 MG 24 hr tablet Take 1 tablet (100 mg total) by mouth daily. 04/13/21   Roxan Hockey, MD  mirtazapine (REMERON) 7.5 MG tablet Take 7.5 mg by mouth at bedtime. 12/29/20   [provider]  Nystatin (GERHARDT'S  BUTT CREAM) CREA Apply 1 application topically 3 (three) times daily. Apply to buttocks/perineum 04/13/21   Roxan Hockey, MD  ondansetron (ZOFRAN) 4 MG tablet Take 1 tablet (4 mg total) by mouth every 6 (six) hours as needed for nausea. 04/13/21   Roxan Hockey, MD  oxyCODONE (OXY IR/ROXICODONE) 5 MG immediate release tablet Take 1 tablet (5 mg total) by mouth every 6 (six) hours as needed for up to 5 days for severe pain. 04/13/21 04/18/21  Roxan Hockey, MD  pantoprazole (PROTONIX) 40 MG tablet Take 1 tablet (40 mg total) by mouth daily. 04/14/21   Roxan Hockey, MD  senna-docusate (SENOKOT-S) 8.6-50 MG tablet Take 2 tablets by mouth at bedtime. 04/13/21 04/13/22  Roxan Hockey, MD  sevelamer carbonate (RENVELA) 800 MG tablet Take 1 tablet (800 mg total) by mouth 3 (three) times daily with meals. 04/13/21   Roxan Hockey, MD  sodium bicarbonate 650 MG tablet Take 2 tablets (1,300 mg total) by mouth 2 (two) times daily. 04/13/21   Roxan Hockey, MD  triamcinolone cream (KENALOG) 0.1 % SMARTSIG:1 Application Topical 2-3 Times Daily 03/21/21   [provider]  Vitamin D, Ergocalciferol, (DRISDOL) 1.25 MG (50000 UNIT) CAPS capsule Take by mouth. Patient not taking: No sig reported 12/29/20   [provider]   Exam: Current vital signs: BP (!) 177/69   Pulse 66   Temp 98.4 F (36.9 C) (Oral)   Resp (!) 22   SpO2 91%   Physical Exam  Constitutional: Appears well-developed and well-nourished.  Psych: Patient is somnolent but can be aroused with continuous stimulation.  Eyes: No scleral injection HENT: No OP obstruction. Head: Normocephalic.  Cardiovascular: Normal rate and regular rhythm.  Respiratory: Effort normal, symmetric excursions bilaterally, no audible wheezing. GI: Soft.  No distension. There is no tenderness.  Skin: WDI  Neuro: Mental Status: Unable to completely assess due to somnolence however, aroused with continuous stimulation. Able to answer  limited yes/no questions. Visual Fields are full to confrontation. Pupils are equal, round, and reactive to light.  EOMI without ptosis or diploplia.  Facial sensation is symmetric to temperature Facial movement is symmetric.  Hearing is intact to voice. Uvula midline and palate elevates symmetrically. Shoulder shrug is symmetric. Tongue is midline without atrophy or fasciculations.  Tone is normal. Bulk is normal. Diffuse weakness (effort dependent) with profound left lower extremity weakness.  Sensation is symmetric to light touch and temperature in the arms and legs. Deep Tendon Reflexes: brisk left lower extremity. Toe are up going on left FNF and HKS unable to assess due to encephalopathy. Gait - Deferred  I have reviewed labs in epic and the pertinent results are:  Ref. Range 04/16/2021   BUN Latest Ref Range: 8 - 23 mg/dL 53 (H)  Creatinine Latest Ref Range: 0.44 - 1.00 mg/dL 7.00 (H)  Calcium Latest Ref Range: 8.9 - 10.3 mg/dL 7.8 (L)  Anion  gap Latest Ref Range: 5 - 15  9  Phosphorus Latest Ref Range: 2.5 - 4.6 mg/dL 6.0 (H)   I have reviewed the images obtained: MRI brain showed severely motion degraded and incomplete examination. However, no new infarct identified and unchanged extent of subacute right ACA infarct.   Assessment: Kamyri Dautrich is a 76 y.o. female PMHx as noted above recent right ACA stroke with residual left lower extremity weakness now with acute right extremity weakness. The patient has not had dialysis and there is a metabolic encephalopathy; complicating the neurological exam. Unable to completely assess right lower extremity weakness and MRI brain did not show new ischemic lesion to explain the weakness. Recommend correcting metabolic derangements so a more thorough history and exam may be completed.    Impression:  Subacute right ACA stroke with residual left lower extremity weakness. Toxic metabolic encephalopathy. ESRD not on  dialysis.   Plan: - Continue statin for goal LDL < 70. - Continue apixaban. - BP goal <130/90. - Continue control of diabetes. - Consider dialysis and correct metabolic derangements. - Telemetry monitoring for arrhythmia. - Recommend bedside Swallow screen. - Recommend Stroke education. - Recommend PT/OT/SLP consult. - Please contact neurology once patient has had dialysis and patient is more awake.  Electronically signed by:  Lynnae Sandhoff, MD Page: [phone] 04/16/2021, 9:54 AM

## 2021-04-16 NOTE — Evaluation (Signed)
Physical Therapy Evaluation Patient Details Name: Tiffany Valencia MRN: [id number] DOB: June 30, 1946 Today's Date: 04/16/2021  History of Present Illness  75 yo female presenting to ED with R sided weakness on 10/10. MRI negative for acute change. Pt with recent admission at [facility] (discharge on 10/7 to SNF) with new R ACA and L hemiparesis. PMH including end-stage renal disease declining hemodialysis, type 2 diabetes mellitus, hypertension, paroxysmal atrial fibrillation chronically anticoagulated on Eliquis, chronic diastolic heart failure, and recurrent ischemic CVAs.  Clinical Impression  Pt admitted secondary to problem above with deficits below. Pt very lethargic this session, so mobility limited to sitting EOB. Required max A +2 for all bed mobility tasks. Pt requiring external assist to maintain sitting balance as well secondary to posterior and L lateral lean. Pt was at SNF prior to admission for rehab and recommend return at d/c. Will continue to follow acutely.        Recommendations for follow up therapy are one component of a multi-disciplinary discharge planning process, led by the attending physician.  Recommendations may be updated based on patient status, additional functional criteria and insurance authorization.  Follow Up Recommendations SNF    Equipment Recommendations  None recommended by PT    Recommendations for Other Services       Precautions / Restrictions Precautions Precautions: Fall Precaution Comments: L hemiplegia Restrictions Weight Bearing Restrictions: No      Mobility  Bed Mobility Overal bed mobility: Needs Assistance Bed Mobility: Supine to Sit;Sit to Supine     Supine to sit: Max assist;HOB elevated;+2 for physical assistance Sit to supine: Max assist;+2 for physical assistance   General bed mobility comments: max A +2 for bringing BLEs and trunk. Required assist to maintain sitting balance.    Transfers                  General transfer comment: Defer for safety due to lethargy  Ambulation/Gait                Stairs            Wheelchair Mobility    Modified Rankin (Stroke Patients Only)       Balance Overall balance assessment: Needs assistance Sitting-balance support: No upper extremity supported;Feet supported Sitting balance-Leahy Scale: Poor Sitting balance - Comments: Reliant on external support for balance Postural control: Posterior lean;Right lateral lean                                   Pertinent Vitals/Pain Pain Assessment: Faces Faces Pain Scale: Hurts little more Pain Location: Buttock - pt reporting two wounds on her bottom Pain Descriptors / Indicators: Sore Pain Intervention(s): Limited activity within patient's tolerance;Monitored during session;Repositioned    Home Living Family/patient expects to be discharged to:: Skilled nursing facility                 Additional Comments: Per chart review, pt was at SNF.    Prior Function Level of Independence: Needs assistance   Gait / Transfers Assistance Needed: Unsure of mobility status following stroke. prior to stroke, but was using AD for ambulation.  ADL's / Homemaking Assistance Needed: Pt reporting "she helps me with that" in reference to ADLs. Per chart review, pt's friend assisted with ADLs prior to recent admission on 10/5.        Hand Dominance   Dominant Hand: Right    Extremity/Trunk Assessment  Upper Extremity Assessment Upper Extremity Assessment: Defer to OT evaluation RUE Deficits / Details: Able to grasp cup and bring to mouth. Weak graso LUE Deficits / Details: No active movement. increased edema. Good PROM LUE Coordination: decreased fine motor;decreased gross motor    Lower Extremity Assessment Lower Extremity Assessment: LLE deficits/detail LLE Deficits / Details: No AROM noted. PROM at ankle and knee WFL    Cervical / Trunk Assessment Cervical / Trunk  Assessment: Kyphotic  Communication   Communication: Expressive difficulties  Cognition Arousal/Alertness: Lethargic Behavior During Therapy: Flat affect Overall Cognitive Status: No family/caregiver present to determine baseline cognitive functioning                                 General Comments: Pt oriented to being at the hospital. Following simple commands. Lethargic and falling asleep throughout session      General Comments General comments (skin integrity, edema, etc.): No family present    Exercises     Assessment/Plan    PT Assessment Patient needs continued PT services  PT Problem List Decreased strength;Decreased range of motion;Decreased activity tolerance;Decreased balance;Decreased mobility;Decreased coordination;Decreased cognition;Decreased knowledge of use of DME;Impaired tone;Obesity;Decreased safety awareness;Decreased knowledge of precautions       PT Treatment Interventions DME instruction;Gait training;Functional mobility training;Therapeutic activities;Therapeutic exercise;Balance training;Neuromuscular re-education;Cognitive remediation;Patient/family education;Wheelchair mobility training    PT Goals (Current goals can be found in the Care Plan section)  Acute Rehab PT Goals Patient Stated Goal: Unstated PT Goal Formulation: Patient unable to participate in goal setting Time For Goal Achievement: 04/30/21 Potential to Achieve Goals: Fair    Frequency Min 2X/week   Barriers to discharge        Co-evaluation PT/OT/SLP Co-Evaluation/Treatment: Yes Reason for Co-Treatment: For patient/therapist safety;To address functional/ADL transfers PT goals addressed during session: Mobility/safety with mobility;Balance         AM-PAC PT "6 Clicks" Mobility  Outcome Measure Help needed turning from your back to your side while in a flat bed without using bedrails?: Total Help needed moving from lying on your back to sitting on the side of a  flat bed without using bedrails?: Total Help needed moving to and from a bed to a chair (including a wheelchair)?: Total Help needed standing up from a chair using your arms (e.g., wheelchair or bedside chair)?: Total Help needed to walk in hospital room?: Total Help needed climbing 3-5 steps with a railing? : Total 6 Click Score: 6    End of Session   Activity Tolerance: Patient limited by lethargy Patient left: in bed;with call bell/phone within reach;with nursing/sitter in room (on strethcer in ED) Nurse Communication: Mobility status PT Visit Diagnosis: Other abnormalities of gait and mobility (R26.89);Muscle weakness (generalized) (M62.81);Other symptoms and signs involving the nervous system (R29.898);Hemiplegia and hemiparesis Hemiplegia - Right/Left: Left Hemiplegia - caused by: Cerebral infarction    Time: 1135-1150 PT Time Calculation (min) (ACUTE ONLY): 15 min   Charges:   PT Evaluation $PT Eval Moderate Complexity: 1 Mod          Reuel Derby, PT, DPT  Acute Rehabilitation Services  Pager: [phone] Office: [phone]   Rudean Hitt 04/16/2021, 4:47 PM

## 2021-04-16 NOTE — ED Notes (Signed)
Pt awake now and drinking a cup of juice. Will continue to monitor and recheck CBG in about 15 mins.

## 2021-04-16 NOTE — H&P (Signed)
History and Physical    PLEASE NOTE THAT DRAGON DICTATION SOFTWARE WAS USED IN THE CONSTRUCTION OF THIS NOTE.   Tiffany Valencia [id number] DOB: 05-13-1946 DOA: 04/16/2021  PCP: Pcp, No Patient coming from: [facility]   I have personally briefly reviewed patient's old medical records in [place]  Chief Complaint: Right lower extremity weakness  HPI: Tiffany Valencia is a 75 y.o. female with medical history significant for end-stage renal disease declining hemodialysis, type 2 diabetes mellitus, hypertension, paroxysmal atrial fibrillation chronically anticoagulated on Eliquis, chronic diastolic heart failure, recurrent ischemic CVAs, who is admitted to [facility] on 04/16/2021 by way of transfer from [place] ED for further evaluation and management of potential acute ischemic CVA after presenting from SNF to the latter facility complaining of acute right lower extremity weakness.   The patient was recently hospitalized at [facility] from 10-20 2-10 722, initially for acute on chronic diastolic heart failure.  However, on hospital day 2, which leads to 04/09/2021, the patient reportedly had evidence of acute left hemiparesis prompting CT head with ensuing MRI brain which reportedly showed evidence of acute embolic stroke involving the right ACA.  Neurology at any point was consulted, and ensuing stroke work-up included MRA of the head and neck as well as monitoring of A1c a lipid panel.  Neurology recommended resumption of her chronic anticoagulation via Eliquis in the setting of paroxysmal atrial fibrillation to be resumed on 04/12/2021.  Additional hospital course at event was notable for IV diuresis with Lasix, with ultimate discharge diuretic regimen noted to be Lasix 80 mg p.o. daily.  Additionally, in the context of a history of end-stage renal disease for which the patient and her family have been refusing hemodialysis, palliative care consult took  place during this recent event hospitalization, with documentation of the patient confirming her Jehovah's Witness status with documentation that she continues to decline hemodialysis and requested full code status, as further detailed in documentation from this prior palliative care consult.  In the setting of end-stage renal disease patient refused optimization of patient's medications associated with she was ultimately discharged from [facility] to SNF for rehab on 04/13/2021, noting residual left hemiparesis at the time of discharge.   While at SNF on 04/15/2021, patient reports acute development of right lower extremity weakness at 1830 while eating dinner.  She reports that this was associated with a transient generalized headache, and denied any associated acute change in sensation, facial droop, acute change in vision, dysarthria, nausea, vomiting, vertigo.  She also confirmed no interval change in the left hemiparesis with which she was discharged from [facility] on 04/13/2021.  She conveys that to her right lower extremity weakness has been constant since onset, and notes no significant improvement since initially noting this deficit 1830 on 04/15/2021.  Patient was subsequently brought to [facility] emergency department for further evaluation management of the above.  [facility] ED Course: CT head showed acute right frontal lobe infarction in the cerebral artery territory. Chronic left cerebellar infarction. No intracranial hemorrhage. CTA head: complete occlusion of right ACA A2; severe stenosis of the left supraclinoid ica . CTA neck w/ contrast:no significant stenosis or large vessel occlusion.  The emergency department physician at [facility] discussed the patient's case and imaging with the on-call neurologist at [facility], Dr. Cheral Marker, recommended transfer to [facility] for further evaluation including MRI brain, and agreed to see patient in consult on arrival.   Consequently, the  patient was subsequently transferred from [facility] ED to [facility] for further evaluation management of potential acute ischemic CVA.   In terms of potential modifiable ischemic CVA risk factors, the patient has a history of type 2 diabetes mellitus for which she is on basal and short acting insulin, with most recent hemoglobin A1c noted to be 5.9% on 04/08/2021.  She also has a history of hypertension, for which she is on several antihypertensive medications as an outpatient, including Imdur, Norvasc, oral clonidine, Dralzine, Toprol-XL.  Medical history also notable for paroxysmal atrial fibrillation for which she is chronically anticoagulated on Eliquis, with resumption of Eliquis 2.5 mg p.o. twice daily on 04/12/2021 per neurology consult during recent event hospitalization, as further detailed above.  She also has a history of chronic diastolic heart failure, with echocardiogram on 04/09/2021 showing LVEF 66%, no focal wall motion normalities, and demonstrating severe concentric LVH, grade 2 diastolic dysfunction, and mild mitral regurgitation.     Review of Systems: As per HPI otherwise 10 point review of systems negative.   Past Medical History:  Diagnosis Date   Anemia in chronic kidney disease (CKD)    Asthma    Chronic kidney disease    Coronary artery disease    Diabetes mellitus without complication ([place])    Hypertension     Past Surgical History:  Procedure Laterality Date   ABDOMINAL HYSTERECTOMY     BACK SURGERY      Social History:  reports that she has never smoked. She has never used smokeless tobacco. She reports that she does not currently use alcohol. She reports that she does not currently use drugs.   No Known Allergies  History reviewed. No pertinent family history.   Prior to Admission medications   Medication Sig Start Date End Date Taking? Authorizing Provider  acetaminophen (TYLENOL) 325 MG tablet Take 2 tablets (650 mg total) by mouth every 6 (six)  hours as needed for mild pain (or Fever >/= 101). 04/13/21   Roxan Hockey, MD  albuterol (VENTOLIN HFA) 108 (90 Base) MCG/ACT inhaler Inhale into the lungs. 02/03/21   [provider]  amLODipine (NORVASC) 10 MG tablet Take 10 mg by mouth daily. 01/25/21   [provider]  atorvastatin (LIPITOR) 80 MG tablet Take 40 mg by mouth daily. 12/26/20   [provider]  cloNIDine (CATAPRES) 0.2 MG tablet Take 1 tablet (0.2 mg total) by mouth 2 (two) times daily. 04/13/21   Emokpae, Courage, MD  ELIQUIS 2.5 MG TABS tablet Take 1 tablet (2.5 mg total) by mouth 2 (two) times daily. 04/13/21   Roxan Hockey, MD  folic acid (FOLVITE) 1 MG tablet Take 1 mg by mouth daily. Patient not taking: No sig reported 02/08/21   [provider]  furosemide (LASIX) 80 MG tablet Take 1 tablet (80 mg total) by mouth daily. 04/14/21   Roxan Hockey, MD  hydrALAZINE (APRESOLINE) 50 MG tablet Take 1 tablet (50 mg total) by mouth 3 (three) times daily. 04/13/21   Roxan Hockey, MD  insulin aspart (NOVOLOG) 100 UNIT/ML FlexPen Inject 0-10 Units into the skin 3 (three) times daily with meals. insulin aspart (novoLOG) injection 0-10 Units 0-10 Units Subcutaneous, 3 times daily with meals CBG < 70: Implement Hypoglycemia Standing Orders and refer to Hypoglycemia Standing Orders sidebar report  CBG 70 - 120: 0 unit CBG 121 - 150: 0 unit  CBG 151 - 200: 1 unit CBG 201 - 250: 2 units CBG 251 - 300: 4 units CBG  301 - 350: 6 units  CBG 351 - 400: 8 units  CBG > 400: 10 units 04/13/21   Emokpae, Courage, MD  isosorbide mononitrate (IMDUR) 30 MG 24 hr tablet Take 1 tablet (30 mg total) by mouth daily. 04/14/21   Roxan Hockey, MD  LANTUS SOLOSTAR 100 UNIT/ML Solostar Pen Inject 4 Units into the skin at bedtime. 04/13/21   Roxan Hockey, MD  metoprolol succinate (TOPROL-XL) 100 MG 24 hr tablet Take 1 tablet (100 mg total) by mouth daily. 04/13/21   Roxan Hockey, MD  mirtazapine (REMERON) 7.5 MG tablet  Take 7.5 mg by mouth at bedtime. 12/29/20   [provider]  Nystatin (GERHARDT'S BUTT CREAM) CREA Apply 1 application topically 3 (three) times daily. Apply to buttocks/perineum 04/13/21   Roxan Hockey, MD  ondansetron (ZOFRAN) 4 MG tablet Take 1 tablet (4 mg total) by mouth every 6 (six) hours as needed for nausea. 04/13/21   Roxan Hockey, MD  oxyCODONE (OXY IR/ROXICODONE) 5 MG immediate release tablet Take 1 tablet (5 mg total) by mouth every 6 (six) hours as needed for up to 5 days for severe pain. 04/13/21 04/18/21  Roxan Hockey, MD  pantoprazole (PROTONIX) 40 MG tablet Take 1 tablet (40 mg total) by mouth daily. 04/14/21   Roxan Hockey, MD  senna-docusate (SENOKOT-S) 8.6-50 MG tablet Take 2 tablets by mouth at bedtime. 04/13/21 04/13/22  Roxan Hockey, MD  sevelamer carbonate (RENVELA) 800 MG tablet Take 1 tablet (800 mg total) by mouth 3 (three) times daily with meals. 04/13/21   Roxan Hockey, MD  sodium bicarbonate 650 MG tablet Take 2 tablets (1,300 mg total) by mouth 2 (two) times daily. 04/13/21   Roxan Hockey, MD  triamcinolone cream (KENALOG) 0.1 % SMARTSIG:1 Application Topical 2-3 Times Daily 03/21/21   [provider]  Vitamin D, Ergocalciferol, (DRISDOL) 1.25 MG (50000 UNIT) CAPS capsule Take by mouth. Patient not taking: No sig reported 12/29/20   [provider]     Objective    Physical Exam: Vitals:   04/16/21 0030 04/16/21 0040 04/16/21 0045  BP: (!) 191/72  (!) 189/71  Pulse: 64  63  Resp: 15  10  Temp:  98.4 F (36.9 C)   TempSrc:  Oral   SpO2: 97%  97%    General: appears to be stated age; alert, oriented Skin: warm, dry, no rash Head:  AT/[place]:  Oral mucosa membranes appear moist, normal dentition Neck: supple; trachea midline Heart:  RRR; did not appreciate any M/R/G Lungs: diminished bibasilar breath sounds, but otherwise CTAB, did not appreciate any wheezes, rales, or rhonchi Abdomen: + BS; soft, ND,  NT Vascular: 2+ pedal pulses b/l; 2+ radial pulses b/l Extremities: 1-2+ edema in b/l Le's , no muscle wasting Neuro: sensation intact in upper and lower extremities b/l; 1/5 strength in upper and lower extre on left; 3/5 strength in RLE; 5/5 strength in RUE; no facial droop, no tremors.    Labs on Admission: I have personally reviewed following labs and imaging studies  CBC: Recent Labs  Lab 04/09/21 0558 04/10/21 0659 04/11/21 0702 04/12/21 0630 04/13/21 0536  WBC 4.2 4.9 5.5 4.8 5.3  HGB 7.6* 8.0* 8.1* 7.9* 8.8*  HCT 26.2* 28.7* 27.3* 28.1* 30.7*  MCV 92.3 94.1 91.6 91.5 90.8  PLT 123* 137* 129* 130* [id number]*   Basic Metabolic Panel: Recent Labs  Lab 04/09/21 0558 04/10/21 0659 04/11/21 0702 04/12/21 0630 04/13/21 0536  NA 139 137 138 137 137  K 4.0 4.1 4.0  4.2 4.4  CL 113* 110 111 110 109  CO2 21* 21* 21* 21* 21*  GLUCOSE 104* 127* 111* 90 108*  BUN 51* 52* 55* 53* 54*  CREATININE 6.72* 6.88* 6.81* 6.75* 6.74*  CALCIUM 7.8* 7.8* 7.7* 7.7* 7.8*  MG 1.8 1.8 1.7  --   --   PHOS 5.4* 6.5* 5.5* 5.6* 6.0*   GFR: Estimated Creatinine Clearance: 7.3 mL/min (A) (by C-G formula based on SCr of 6.74 mg/dL (H)). Liver Function Tests: Recent Labs  Lab 04/09/21 0558 04/10/21 0659 04/11/21 0702 04/12/21 0630 04/13/21 0536  ALBUMIN 2.3* 2.3* 2.1* 2.2* 2.3*   No results for input(s): LIPASE, AMYLASE in the last 168 hours. No results for input(s): AMMONIA in the last 168 hours. Coagulation Profile: No results for input(s): INR, PROTIME in the last 168 hours. Cardiac Enzymes: No results for input(s): CKTOTAL, CKMB, CKMBINDEX, TROPONINI in the last 168 hours. BNP (last 3 results) No results for input(s): PROBNP in the last 8760 hours. HbA1C: No results for input(s): HGBA1C in the last 72 hours. CBG: Recent Labs  Lab 04/12/21 2034 04/13/21 0420 04/13/21 0752 04/13/21 1211 04/13/21 1539  GLUCAP 152* 111* 101* 182* 143*   Lipid Profile: No results for input(s):  CHOL, HDL, LDLCALC, TRIG, CHOLHDL, LDLDIRECT in the last 72 hours. Thyroid Function Tests: No results for input(s): TSH, T4TOTAL, FREET4, T3FREE, THYROIDAB in the last 72 hours. Anemia Panel: No results for input(s): VITAMINB12, FOLATE, FERRITIN, TIBC, IRON, RETICCTPCT in the last 72 hours. Urine analysis:    Component Value Date/Time   COLORURINE YELLOW 04/08/2021 [place] 04/08/2021 1714   LABSPEC 1.014 04/08/2021 1714   PHURINE 6.0 04/08/2021 1714   GLUCOSEU 150 (A) 04/08/2021 1714   HGBUR SMALL (A) 04/08/2021 1714   BILIRUBINUR NEGATIVE 04/08/2021 1714   KETONESUR NEGATIVE 04/08/2021 1714   PROTEINUR >=300 (A) 04/08/2021 1714   NITRITE NEGATIVE 04/08/2021 1714   LEUKOCYTESUR NEGATIVE 04/08/2021 1714    Radiological Exams on Admission: No results found.    Assessment/Plan   ; SYNOPSIS / BIG PICTURE: 75 y.o F admitted via transfer from [facility] ED for suspected acute ischemic CVA. MRI brain pending. Neuro consulted.   ;    Principal Problem:   Stroke [facility]) Active Problems:   DM (diabetes mellitus), type 2 with peripheral vascular complications (HCC)   AF (paroxysmal atrial fibrillation) (HCC)   Hypertension   ESRD (end stage renal disease) (HCC)   Chronic diastolic CHF (congestive heart failure) ([place])    #) Acute right lower extremity weakness: acute onset of right lower extremity weakness at 1830 on 04/15/2021 while eating dinner, thereby correlating with last known normal,  Without significant ensuing improvement of this reported deficit,concerning for potential acute ischemic CVA and in the absence of any associated new acute focal neurologic deficits, with discharge summary from recent hospitalization at any been noting residual left hemiparesis upon discharge, the patient confirming no interval change in her left hemiparesis.  In the context of her recent hospitalization a been included acute right ACA, CT of the head at [facility] ED today  showed acute right frontal lobe infarct with associated cerebral artery territory neck left cerebellar infarct.  CTA head and neck, as further noted above.  EDP at [facility] discussed the patient's case and imaging with the neurologist at [facility], Dr. Cheral Marker, recommended transfer to the hospital service at [place], for further evaluation management of the above, including MRI brain, and will consult on arrival at  [place].   Of note, the patient reportedly possesses multiple modifiable CVA risk factors including a history of type 2 diabetes mellitus, hypertension, and paroxsymal atrial fibrillation on chronic anticoagulation via Eliquis, with resumption of such on 04/12/2021 per neurology consultation that occurred during recent an event hospitalization, as further detailed above.  She is a lifelong non-smoker, without any known history of underlying hyperlipidemia .  No known history of obstructive sleep apnea.  The setting of this Eliquis resumption, patient is not a candidate for thrombolytic therapy.   Current outpatient anti-lipid regimen: Atorvastatin 40 mg p.o. nightly, with consideration for escalating to 80 mg p.o. daily given the recurrent nature of her documented history of ischemic CVAs as well as potential acute ischemic CVA leading to today's presentation while on 40 mg dose.  We will allow forpermissive hypertension for 24 hours following onset of acute focal neurologic deficits at 1830 on 04/15/2021, with associated parameters further quantified below, and with period of observance of permissive hypertension to end at [place] on 04/16/2021.     Plan: Nursing bedside swallow evaluation x 1 now, and will not initiate oral medications or diet until the patient has passed this. Head of the bed at 30 degrees. Neuro checks per protocol. VS per protocol. Will allow for permissive hypertension for 24 hours following onset of acute focal neurologic deficits until 1830 on 04/16/2021 during  which will hold home antihypertensive medications, with prn IV hydralazine ordered for systolic blood pressure greater than [id number] mmHg or diastolic blood pressure greater than 110 mmHg. Monitor on telemetry,   MRI brain.  As the patient recently had echocardiogram 1 week ago, will refrain from reordering at this study at the present time.  Additionally, no indication to repeat hemoglobin A1c or lipid panel given that both of these were checked within the last week, as further detailed above.  PT OT consults have been placed. Dr. Cheral Marker notified of patient's arrival at [facility], with additional recommendations pending, including debridement of recommended resumption of Eliquis. Will follow for result of MRI brain.  Resume atorvastatin 40 mg, with next dose now.       #) End-stage renal disease: Documented history of such, with recommendation her height of care consult during recent meeting and hospitalization the patient's continued refusal for hemodialysis. Will check CMP now to assess serum potassium level as well as to evaluate for evidence of metabolic acidosis and closely monitor ensuing volume status.  At this time, the patient does not appear grossly volume overloaded, but will proceed with chest x-ray to further assess.  Maintaining oxygen saturations in the high 90s on room air.  It is noted that the patient received IV contrast via the aforementioned CTA head and neck performed at [facility] ED earlier this evening.  Plan: CMP now, as above.  Check serum magnesium and phosphorus levels.  Monitor strict I's and O's Daily weights.  Chest x-ray, as above. Will re-consult palliative care service for assistance with additional clarification and reconciliation of goals of care as well as clarification of CODE STATUS given previous report of refusal of hemodialysis while maintaining desire for full CODE STATUS.       #) Type 2 diabetes mellitus: Documented history of such, with most recent  hemoglobin A1c noted to be 5.9% when checked on 04/08/2021.  She is on Lantus 4 units subcu nightly as well as sliding scale NovoLog as an outpatient.  Hold home scheduled basal insulin for now.  Accu-Cheks before every meal and  at bedtime with low-dose sliding scale insulin been ordered, with attention for risk of hypoglycemia in the setting of concomitant end-stage renal disease.     #) Essential hypertension: Documented history of such, with suspected secondary contribution from end-stage renal disease refusing hemodialysis, with outpatient antihypertensive regimen noted to include Imdur, hydralazine, Norvasc, oral clonidine, Lasix, Toprol-XL.  Presenting systolic blood pressures upon arrival at [facility] noted to be in the range of the 160s to 190s.  As noted above, we will observe 24 hours of permissive hypertension until 1830 on 04/16/2021.  Plan: Hold home and hypertensive medications for now.  Present hypertension, as above, with as needed IV hydralazine ordered for systolic blood pressure greater than [id number] or diastolic blood pressure greater than 110 until 1830 on 04/16/2021, as above.  Close monitoring of ensuing blood pressure via routine vital signs.  Monitor strict I's and O's.  Daily weights.       #) Paroxysmal atrial fibrillation: Documented history of such. In the setting of a CHA2DS2-VASc score of 9, there is an indication for the patient to be on chronic anticoagulation for thromboembolic prophylaxis. Consistent with this, the patient is chronically anticoagulated on Eliquis, with timing of recent resumption following recent acute embolic CVA, as further detailed above. Home AV nodal blocking regimen: Toprol-XL.  Most recent echocardiogram occurred on 04/09/2021, as further detailed above.   Plan: Check EKG now.  Monitor strict I's & O's and daily weights. Repeat BMP and CBC in the morning. Check serum magnesium level. Continue home AV nodal blocking regimen, as above. Will defer  timing of resumption of Eliquis to neurology recommendations associated with formal neurology consultation for suspected presenting acute ischemic CVA, as further detailed above.        #) Chronic diastolic heart failure: documented history of such, with most recent echocardiogram performed on 04/09/2021, as detailed above. No overt clinical evidence to suggest acutely decompensated heart failure at this time, however the patient is certainly at risk for ensuing development of such given her history of end-stage renal disease with declination of hemodialysis, as further detailed above.  We will closely monitor ensuing strict I's and O's, continuous pulse oximetry, and check chest x-ray now.  Home diuretic regimen noted to be Lasix 80 mg p.o. daily, as confirmed via review of discharge summary from recent hospitalization at [facility].   Plan: monitor strict I's & O's and daily weights. Repeat BMP in the morning. Check serum magnesium level. Continue home diuretic regimen.  Chest x-ray, as above.     DVT prophylaxis: SCD's   Code Status: Full code Disposition Plan: Per Rounding Team Consults called: neurology consulted and notified of patient's arrival at [facility], as further detailed above;  Admission status: Observation;   Of note, this patient was added by me to the following Admit List/Treatment Team:  mcadmits.   Of note, the Adult Admission Order Set (Multimorbid order set) was used by me in the admission process for this patient.  PLEASE NOTE THAT DRAGON DICTATION SOFTWARE WAS USED IN THE CONSTRUCTION OF THIS NOTE.   Rhetta Mura DO [facility] Pager [phone] From [place]   04/16/2021, 12:56 AM

## 2021-04-16 NOTE — ED Notes (Signed)
Patient transported to MRI 

## 2021-04-16 NOTE — Progress Notes (Signed)
New Admission Note:  Arrival Method:via stretcher from [facility] ED Mental Orientation: alert and oriented to person and place Telemetry: yes NSR noted on monitor Assessment: Completed Skin: pt has stage II on sacrum IV: SL Rt hand Pain: no c/o pain or discomfort Safety Measures: Safety Fall Prevention Plan was given, discussed. Admission: Completed 3W: Patient has been orientated to the room, unit and the staff. Family: no family at bedside  Orders have been reviewed and implemented. Will continue to monitor the patient. Call light has been placed within reach and bed alarm has been activated.   Janus Molder, RN

## 2021-04-16 NOTE — Consult Note (Signed)
Consultation Note Date: 04/16/2021   Patient Name: Tiffany Valencia  DOB: [birth year]  MRN: [id number]  Age / Sex: 75 y.o., female  PCP: Pcp, No Referring Physician: Erasmo Downer*  Reason for Consultation: Establishing goals of care  HPI/Patient Profile: 75 y.o. female  with past medical history of end-stage renal disease declining hemodialysis, type 2 diabetes mellitus, hypertension, paroxysmal atrial fibrillation chronically anticoagulated on Eliquis, chronic diastolic heart failure, recurrent ischemic CVAs admitted on 04/16/2021 from SNF with acute right lower extremity weakness.   The patient was recently hospitalized at [facility] from 04-08-21 to 04-13-21, initially for acute on chronic diastolic heart failure. She then had acute embolic stroke involving the right ACA. Patient's family reports this is now her 5th stroke on current admission. PMT has been consulted to assist with goals of care conversation.  Clinical Assessment and Goals of Care:  I have reviewed medical records including EPIC notes, labs and imaging, received report from RN, assessed the patient and then called patient's daughter Joellyn Rued to discuss diagnosis prognosis, GOC, EOL wishes, disposition and options. Patient's sister Margaretha Sheffield then joined our call to continue the conversation.  I introduced Palliative Medicine as specialized medical care for people living with serious illness. It focuses on providing relief from the symptoms and stress of a serious illness. The goal is to improve quality of life for both the patient and the family.  We discussed a brief life review of the patient and then focused on their current illness. The natural disease trajectory and expectations at EOL were discussed.   Patient's daughter and sister are quite upset that she was moved to [place] last month (Sept 2022) from her home of [place],  attributing her recent decline to a lack of access to her medications and well-established primary care (Dr. Andreas Blower). They report that the family member who relocated the patient was attempting to get POA documentation completed despite her lack of capacity. Patient is not married and has several children who are hoping to come visit later this week. Patient is a Restaurant manager, fast food. Patient's family confirms that they have only recently learned of the severity of her illness, as she was keeping this information private. For example, family learned during the last admission that her kidney function was severe and near the point of requiring HD if consistent patient's with goals. Provided education on the risks and benefits of HD, sharing my concern that this would likely cause more harm than benefit given patient's frailty and comorbidities. Shared my recommendation to honor patient's wishes and transition to comfort care if patient's kidney function continues to decline.  I attempted to elicit values and goals of care important to the patient.   Patient's family confirms that she has previously stated her wishes for a DNR order. Patient did not want HD even if this resulted in her death. At this time, they are hopeful to avoid HD and stabilize with medical management. They are hopeful for patient's return to SNF for continued rehab, with the eventual goal of  moving her back to [place].   Advanced directives, concepts specific to code status and rehospitalization were considered and discussed.  Hospice and Palliative Care services outpatient were explained and offered.  Discussed the importance of continued conversation with family and the medical providers regarding overall plan of care and treatment options, ensuring decisions are within the context of the patient's values and GOCs.    Questions and concerns were addressed. The family was encouraged to call with questions or concerns.  PMT will  continue to support holistically.     NEXT OF KIN are the majority of patient's reasonably available adult children. No HCPOA on file.    SUMMARY OF RECOMMENDATIONS   -Family agrees with DNR -Continue current care; patient would not want HD even if she would die -Patient's children will attempt to visit in person later this week; recommended to honor patient's wishes to decline HD and transition to comfort care/hospice if she declines further -Patient is [employer] W[employer]NESS - no blood products -Psychosocial and emotional support provided -Ongoing goc discussions -PMT will continue to follow   Code Status/Advance Care Planning: DNR  Palliative Prophylaxis:  Aspiration, Delirium Protocol, and Turn Reposition  Additional Recommendations (Limitations, Scope, Preferences): No Blood Transfusions and No Hemodialysis  Psycho-social/Spiritual:  Desire for further Chaplaincy support:tbd Additional Recommendations: Caregiving  Support/Resources, Education on Hospice, and Referral to [employer]   Prognosis:  Unable to determine  Discharge Planning: To Be Determined      Primary Diagnoses: Present on Admission:  Stroke ([place])  DM (diabetes mellitus), type 2 with peripheral vascular complications (HCC)  AF (paroxysmal atrial fibrillation) (HCC)  Hypertension  ESRD (end stage renal disease) (HCC)  Chronic diastolic CHF (congestive heart failure) ([place])   I have reviewed the medical record, interviewed the patient and family, and examined the patient. The following aspects are pertinent.  Past Medical History:  Diagnosis Date   Anemia in chronic kidney disease (CKD)    Asthma    Chronic kidney disease    Coronary artery disease    Diabetes mellitus without complication (HCC)    Hypertension    Social History   Socioeconomic History   Marital status: Unknown    Spouse name: Not on file   Number of children: Not on file   Years of education: Not on file   Highest  education level: Not on file  Occupational History   Not on file  Tobacco Use   Smoking status: Never   Smokeless tobacco: Never  Vaping Use   Vaping Use: Never used  Substance and Sexual Activity   Alcohol use: Not Currently   Drug use: Not Currently   Sexual activity: Not on file  Other Topics Concern   Not on file  Social History Narrative   Not on file   Social Determinants of Health   Financial Resource Strain: Not on file  Food Insecurity: Not on file  Transportation Needs: Not on file  Physical Activity: Not on file  Stress: Not on file  Social Connections: Not on file   History reviewed. No pertinent family history. Scheduled Meds:   stroke: mapping our early stages of recovery book   Does not apply Once   atorvastatin  40 mg Oral Daily   furosemide  80 mg Oral Daily   insulin aspart  0-6 Units Subcutaneous TID WC   metoprolol succinate  100 mg Oral Daily   pantoprazole  40 mg Oral Daily   sevelamer carbonate  800 mg Oral TID WC  sodium bicarbonate  1,300 mg Oral BID   Continuous Infusions: PRN Meds:.acetaminophen **OR** acetaminophen, hydrALAZINE Medications Prior to Admission:  Prior to Admission medications   Medication Sig Start Date End Date Taking? Authorizing Provider  albuterol (VENTOLIN HFA) 108 (90 Base) MCG/ACT inhaler Inhale into the lungs. 02/03/21  Yes [provider]  amLODipine (NORVASC) 10 MG tablet Take 10 mg by mouth daily. 01/25/21  Yes [provider]  atorvastatin (LIPITOR) 80 MG tablet Take 40 mg by mouth daily. 12/26/20  Yes [provider]  cloNIDine (CATAPRES) 0.2 MG tablet Take 1 tablet (0.2 mg total) by mouth 2 (two) times daily. 04/13/21  Yes Emokpae, Courage, MD  ELIQUIS 2.5 MG TABS tablet Take 1 tablet (2.5 mg total) by mouth 2 (two) times daily. 04/13/21  Yes Emokpae, Courage, MD  isosorbide mononitrate (IMDUR) 30 MG 24 hr tablet Take 1 tablet (30 mg total) by mouth daily. 04/14/21  Yes Emokpae, Courage, MD   LANTUS SOLOSTAR 100 UNIT/ML Solostar Pen Inject 4 Units into the skin at bedtime. Patient taking differently: Inject 6 Units into the skin at bedtime. 04/13/21  Yes Emokpae, Courage, MD  metoprolol succinate (TOPROL-XL) 100 MG 24 hr tablet Take 1 tablet (100 mg total) by mouth daily. 04/13/21  Yes Emokpae, Courage, MD  mirtazapine (REMERON) 7.5 MG tablet Take 7.5 mg by mouth at bedtime. 12/29/20  Yes [provider]  pantoprazole (PROTONIX) 40 MG tablet Take 1 tablet (40 mg total) by mouth daily. 04/14/21  Yes Emokpae, Courage, MD  senna-docusate (SENOKOT-S) 8.6-50 MG tablet Take 2 tablets by mouth at bedtime. 04/13/21 04/13/22 Yes Emokpae, Courage, MD  Vitamin D, Ergocalciferol, (DRISDOL) 1.25 MG (50000 UNIT) CAPS capsule Take 50,000 Units by mouth every 7 (seven) days. Monday 12/29/20  Yes [provider]  acetaminophen (TYLENOL) 325 MG tablet Take 2 tablets (650 mg total) by mouth every 6 (six) hours as needed for mild pain (or Fever >/= 101). 04/13/21   Roxan Hockey, MD  folic acid (FOLVITE) 1 MG tablet Take 1 mg by mouth daily. Patient not taking: No sig reported 02/08/21   [provider]  furosemide (LASIX) 80 MG tablet Take 1 tablet (80 mg total) by mouth daily. 04/14/21   Roxan Hockey, MD  hydrALAZINE (APRESOLINE) 50 MG tablet Take 1 tablet (50 mg total) by mouth 3 (three) times daily. 04/13/21   Roxan Hockey, MD  insulin aspart (NOVOLOG) 100 UNIT/ML FlexPen Inject 0-10 Units into the skin 3 (three) times daily with meals. insulin aspart (novoLOG) injection 0-10 Units 0-10 Units Subcutaneous, 3 times daily with meals CBG < 70: Implement Hypoglycemia Standing Orders and refer to Hypoglycemia Standing Orders sidebar report  CBG 70 - 120: 0 unit CBG 121 - 150: 0 unit  CBG 151 - 200: 1 unit CBG 201 - 250: 2 units CBG 251 - 300: 4 units CBG 301 - 350: 6 units  CBG 351 - 400: 8 units  CBG > 400: 10 units 04/13/21   Emokpae, Courage, MD  Nystatin (GERHARDT'S BUTT CREAM)  CREA Apply 1 application topically 3 (three) times daily. Apply to buttocks/perineum 04/13/21   Roxan Hockey, MD  ondansetron (ZOFRAN) 4 MG tablet Take 1 tablet (4 mg total) by mouth every 6 (six) hours as needed for nausea. 04/13/21   Roxan Hockey, MD  oxyCODONE (OXY IR/ROXICODONE) 5 MG immediate release tablet Take 1 tablet (5 mg total) by mouth every 6 (six) hours as needed for up to 5 days for severe pain. 04/13/21 04/18/21  Roxan Hockey, MD  sevelamer carbonate (RENVELA) 800 MG tablet Take 1 tablet (800 mg total) by mouth 3 (three) times daily with meals. 04/13/21   Roxan Hockey, MD  sodium bicarbonate 650 MG tablet Take 2 tablets (1,300 mg total) by mouth 2 (two) times daily. 04/13/21   Roxan Hockey, MD  triamcinolone cream (KENALOG) 0.1 % SMARTSIG:1 Application Topical 2-3 Times Daily 03/21/21   [provider]   No Known Allergies Review of Systems  Unable to perform ROS: Mental status change   Physical Exam Vitals and nursing note reviewed.  Constitutional:      Appearance: She is obese. She is ill-appearing.  Cardiovascular:     Rate and Rhythm: Normal rate.  Pulmonary:     Effort: Pulmonary effort is normal. No respiratory distress.  Neurological:     Mental Status: She is lethargic and disoriented.    Vital Signs: BP (!) 194/70   Pulse 63   Temp 98.4 F (36.9 C) (Oral)   Resp 16   SpO2 97%      SpO2: SpO2: 97 % O2 Device:SpO2: 97 % O2 Flow Rate: .   IO: Intake/output summary: No intake or output data in the 24 hours ending 04/16/21 1442  LBM:   Baseline Weight:   Most recent weight:       Palliative Assessment/Data:     Time In: 12:45pm Time Out: 2:00pm Time Total: 75 minutes Greater than 50% of this time was spent in counseling and coordinating care related to the above assessment and plan.  Dorthy Cooler, PA-C Palliative Medicine Team Team phone # [phone]  Thank you for allowing the Palliative Medicine Team to assist  in the care of this patient. Please utilize secure chat with additional questions, if there is no response within 30 minutes please call the above phone number.  Palliative Medicine Team providers are available by phone from 7am to 7pm daily and can be reached through the team cell phone.  Should this patient require assistance outside of these hours, please call the patient's attending physician.

## 2021-04-16 NOTE — ED Notes (Signed)
EDP notfied of BP 189/79. Prn orders given

## 2021-04-16 NOTE — ED Notes (Signed)
Patient able to take medications with coercion to swallow. Patient seems to fall asleep during administration. PT and OT able to work with patient at this time

## 2021-04-16 NOTE — Progress Notes (Signed)
Pt calling out every 5 min or more frequent for nurse to come to rm now. When I entered rm I asked what she wanted and she stated she wanted her phone. When I asked her what she wanted it for she said to call 911 about her HA. I explained although she knew that she was already at the hospital and that's where they would be taking her. Pt given Tylenol 650 mg po for HA with HS meds. Lights turned off and door closed 1/2 way to promote sleep

## 2021-04-16 NOTE — ED Notes (Signed)
This RN along with the NT attempted to get the pt to drink a cup of juice d/t pt's CBG being 63. Pt will open her eyes and look at [place] but will not follow commands at this time. Pt is also not trying to drink from the straw even with assistance. This RN had the Network engineer page admitting. Will continue to monitor.

## 2021-04-16 NOTE — ED Notes (Signed)
Breakfast order placed ?

## 2021-04-16 NOTE — ED Notes (Signed)
Pt had incontinent episode. Pt cleaned, sheets replaced, purewick applied. RN noted small pressure wound on pts sacral area. Pressure dressing was applied.

## 2021-04-17 DIAGNOSIS — E669 Obesity, unspecified: Secondary | ICD-10-CM | POA: Diagnosis present

## 2021-04-17 DIAGNOSIS — R29716 NIHSS score 16: Secondary | ICD-10-CM | POA: Diagnosis present

## 2021-04-17 DIAGNOSIS — E1151 Type 2 diabetes mellitus with diabetic peripheral angiopathy without gangrene: Secondary | ICD-10-CM | POA: Diagnosis present

## 2021-04-17 DIAGNOSIS — R29714 NIHSS score 14: Secondary | ICD-10-CM | POA: Diagnosis not present

## 2021-04-17 DIAGNOSIS — E1122 Type 2 diabetes mellitus with diabetic chronic kidney disease: Secondary | ICD-10-CM | POA: Diagnosis present

## 2021-04-17 DIAGNOSIS — G928 Other toxic encephalopathy: Secondary | ICD-10-CM | POA: Diagnosis present

## 2021-04-17 DIAGNOSIS — Z66 Do not resuscitate: Secondary | ICD-10-CM | POA: Diagnosis present

## 2021-04-17 DIAGNOSIS — Z20822 Contact with and (suspected) exposure to covid-19: Secondary | ICD-10-CM | POA: Diagnosis present

## 2021-04-17 DIAGNOSIS — I6300 Cerebral infarction due to thrombosis of unspecified precerebral artery: Secondary | ICD-10-CM | POA: Diagnosis not present

## 2021-04-17 DIAGNOSIS — I132 Hypertensive heart and chronic kidney disease with heart failure and with stage 5 chronic kidney disease, or end stage renal disease: Secondary | ICD-10-CM | POA: Diagnosis present

## 2021-04-17 DIAGNOSIS — Z6832 Body mass index (BMI) 32.0-32.9, adult: Secondary | ICD-10-CM | POA: Diagnosis not present

## 2021-04-17 DIAGNOSIS — R29712 NIHSS score 12: Secondary | ICD-10-CM | POA: Diagnosis present

## 2021-04-17 DIAGNOSIS — I63443 Cerebral infarction due to embolism of bilateral cerebellar arteries: Secondary | ICD-10-CM | POA: Diagnosis present

## 2021-04-17 DIAGNOSIS — Z515 Encounter for palliative care: Secondary | ICD-10-CM | POA: Diagnosis not present

## 2021-04-17 DIAGNOSIS — I63421 Cerebral infarction due to embolism of right anterior cerebral artery: Secondary | ICD-10-CM | POA: Diagnosis present

## 2021-04-17 DIAGNOSIS — R29708 NIHSS score 8: Secondary | ICD-10-CM | POA: Diagnosis not present

## 2021-04-17 DIAGNOSIS — Z992 Dependence on renal dialysis: Secondary | ICD-10-CM | POA: Diagnosis not present

## 2021-04-17 DIAGNOSIS — J45909 Unspecified asthma, uncomplicated: Secondary | ICD-10-CM | POA: Diagnosis present

## 2021-04-17 DIAGNOSIS — Z9115 Patient's noncompliance with renal dialysis: Secondary | ICD-10-CM | POA: Diagnosis not present

## 2021-04-17 DIAGNOSIS — N186 End stage renal disease: Secondary | ICD-10-CM | POA: Diagnosis present

## 2021-04-17 DIAGNOSIS — G8314 Monoplegia of lower limb affecting left nondominant side: Secondary | ICD-10-CM | POA: Diagnosis present

## 2021-04-17 DIAGNOSIS — I5032 Chronic diastolic (congestive) heart failure: Secondary | ICD-10-CM | POA: Diagnosis present

## 2021-04-17 DIAGNOSIS — I480 Paroxysmal atrial fibrillation: Secondary | ICD-10-CM | POA: Diagnosis present

## 2021-04-17 DIAGNOSIS — R29709 NIHSS score 9: Secondary | ICD-10-CM | POA: Diagnosis not present

## 2021-04-17 DIAGNOSIS — D631 Anemia in chronic kidney disease: Secondary | ICD-10-CM | POA: Diagnosis present

## 2021-04-17 LAB — GLUCOSE, CAPILLARY
Glucose-Capillary: 95 mg/dL (ref 70–99)
Glucose-Capillary: 121 mg/dL — ABNORMAL HIGH (ref 70–99)
Glucose-Capillary: 120 mg/dL — ABNORMAL HIGH (ref 70–99)
Glucose-Capillary: 105 mg/dL — ABNORMAL HIGH (ref 70–99)

## 2021-04-17 MED ORDER — CLONIDINE HCL 0.1 MG PO TABS
0.1000 mg | ORAL_TABLET | Freq: Three times a day (TID) | ORAL | Status: DC | PRN
  Filled 2021-04-17: qty 1

## 2021-04-17 MED ORDER — APIXABAN 5 MG PO TABS
5.0000 mg | ORAL_TABLET | Freq: Two times a day (BID) | ORAL | Status: DC
  Administered 2021-04-17 – 2021-04-19 (×4): 5 mg via ORAL
  Filled 2021-04-17 (×4): qty 1

## 2021-04-17 NOTE — Progress Notes (Signed)
RN offered food, fluids, prescribed medications. Pt refused food and medications, took fluids. Stated she "can't eat that crap." RN attempted to find out foods she would like. Pt stated she prefers chicken. RN asked nutrition services to send chicken on trays. Will continue to attempt giving medications and offer fluids and food.

## 2021-04-17 NOTE — Plan of Care (Signed)
  Problem: Education: Goal: Knowledge of General Education information will improve Description: Including pain rating scale, medication(s)/side effects and non-pharmacologic comfort measures Outcome: Progressing   Problem: Health Behavior/Discharge Planning: Goal: Ability to manage health-related needs will improve Outcome: Progressing   Problem: Clinical Measurements: Goal: Ability to maintain clinical measurements within normal limits will improve Outcome: Progressing Goal: Will remain free from infection Outcome: Progressing Goal: Diagnostic test results will improve Outcome: Progressing Goal: Respiratory complications will improve Outcome: Progressing Goal: Cardiovascular complication will be avoided Outcome: Progressing   Problem: Activity: Goal: Risk for activity intolerance will decrease Outcome: Progressing   Problem: Nutrition: Goal: Adequate nutrition will be maintained Outcome: Progressing   Problem: Coping: Goal: Level of anxiety will decrease Outcome: Progressing   Problem: Elimination: Goal: Will not experience complications related to bowel motility Outcome: Progressing Goal: Will not experience complications related to urinary retention Outcome: Progressing   Problem: Pain Managment: Goal: General experience of comfort will improve Outcome: Progressing   Problem: Safety: Goal: Ability to remain free from injury will improve Outcome: Progressing   Problem: Skin Integrity: Goal: Risk for impaired skin integrity will decrease Outcome: Progressing   Problem: Education: Goal: Knowledge of disease or condition will improve Outcome: Progressing Goal: Knowledge of secondary prevention will improve Outcome: Progressing Goal: Knowledge of patient specific risk factors addressed and post discharge goals established will improve Outcome: Progressing Goal: Individualized Educational Video(s) Outcome: Progressing   Problem: Self-Care: Goal:  Verbalization of feelings and concerns over difficulty with self-care will improve Outcome: Progressing Goal: Ability to communicate needs accurately will improve Outcome: Progressing   Problem: Nutrition: Goal: Risk of aspiration will decrease Outcome: Progressing

## 2021-04-17 NOTE — Progress Notes (Signed)
Pt repeatedly pulling off tele monitor leads and throwing box on floor. MD Candiss Norse asked this RN to d/c tele order, and will continue to monitor.

## 2021-04-17 NOTE — Progress Notes (Signed)
Pt repeatedly calling 911. RN in room 911  returned call, asked if everything was OK. RN told 911 that she is RN, that pt is in [facility]. EMS ended call. RN explained to pt that 911 takes patients to the hospital, but not from the hospital. Pt demanding that RN give her phone to call her daughter. RN stated she would call the daughter, and give phone to pt. Will continue to monitor.

## 2021-04-17 NOTE — Consult Note (Signed)
Reason for Consult: Chronic kidney disease stage V Referring Physician: Phill Myron, MD [facility])  HPI:  75 year old woman with history predominantly obtained from chart review/family (over the phone).  She has underlying history of hypertension, paroxysmal atrial fibrillation on anticoagulation with Eliquis, chronic diastolic heart failure and diabetes with consequent chronic kidney disease stage V and was previously under the care of a nephrologist up in the [place] area.  She was admitted to [facility] last week for acute CVA and was seen by Drs. Georgie Chard of nephrology for concern of advanced chronic kidney disease.  Extensive discussions were held and she echoed her decision not to pursue any forms of renal replacement therapy that she continues to endorse at this time.  She was admitted to [facility] with worsening left lower extremity weakness after recent subacute right CVA.  When I saw her today, she appears to be vaguely oriented to place and time.  She complains of feeling uncomfortable and wanting to be repositioned and asks repeatedly about going home.  She raises her voice when I start discussing dialysis and informs me that she would never do it and is ready to "go and be with the [place]".   Past Medical History:  Diagnosis Date   Anemia in chronic kidney disease (CKD)    Asthma    Chronic kidney disease    Coronary artery disease    Diabetes mellitus without complication ([place])    Hypertension     Past Surgical History:  Procedure Laterality Date   ABDOMINAL HYSTERECTOMY     BACK SURGERY      History reviewed. No pertinent family history.  Social History:  reports that she has never smoked. She has never used smokeless tobacco. She reports that she does not currently use alcohol. She reports that she does not currently use drugs.  Allergies: No Known Allergies  Medications: I have reviewed the patient's current medications. Scheduled:    stroke: mapping our early stages of recovery book   Does not apply Once   amLODipine  10 mg Oral Daily   atorvastatin  40 mg Oral Daily   cloNIDine  0.2 mg Oral BID   furosemide  80 mg Oral Daily   hydrALAZINE  25 mg Oral TID   insulin aspart  0-6 Units Subcutaneous TID WC   metoprolol succinate  100 mg Oral Daily   pantoprazole  40 mg Oral Daily   sevelamer carbonate  800 mg Oral TID WC   sodium bicarbonate  1,300 mg Oral BID    BMP Latest Ref Rng & Units 04/16/2021 04/13/2021 04/12/2021  Glucose 70 - 99 mg/dL 89 108(H) 90  BUN 8 - 23 mg/dL 53(H) 54(H) 53(H)  Creatinine 0.44 - 1.00 mg/dL 7.00(H) 6.74(H) 6.75(H)  Sodium 135 - 145 mmol/L 136 137 137  Potassium 3.5 - 5.1 mmol/L 4.8 4.4 4.2  Chloride 98 - 111 mmol/L 105 109 110  CO2 22 - 32 mmol/L 22 21(L) 21(L)  Calcium 8.9 - 10.3 mg/dL 7.8(L) 7.8(L) 7.7(L)    CBC Latest Ref Rng & Units 04/16/2021 04/13/2021 04/12/2021  WBC 4.0 - 10.5 K/uL 5.6 5.3 4.8  Hemoglobin 12.0 - 15.0 g/dL 8.3(L) 8.8(L) 7.9(L)  Hematocrit 36.0 - 46.0 % 28.8(L) 30.7(L) 28.1(L)  Platelets 150 - 400 K/uL 165 105(L) 130(L)     MR BRAIN WO CONTRAST  Result Date: 04/16/2021 CLINICAL DATA:  Neuro deficit, acute, stroke suspected. EXAM: MRI HEAD WITHOUT CONTRAST TECHNIQUE: Multiplanar, multiecho pulse sequences of the  brain and surrounding structures were obtained without intravenous contrast. COMPARISON:  04/09/2021 FINDINGS: The study is severely motion degraded and was terminated prior to completion. Axial and coronal diffusion, sagittal T1, axial T2, and axial FLAIR sequences were obtained. Brain: A large subacute right ACA infarct is unchanged in extent from the prior MRI. Associated cytotoxic edema has increased without significant mass effect. No new infarct is identified. Encephalomalacia is again noted anteriorly in the left frontal lobe and in the left greater than right cerebellar hemispheres. Chronic small vessel ischemia is again noted in the cerebral white  matter bilaterally with chronic lacunar infarcts in the basal ganglia, left thalamus, and pons. No gross intracranial hemorrhage, midline shift, or extra-axial fluid collection is identified. There is mild cerebral atrophy. Vascular: Major intracranial arterial flow voids are grossly preserved. Skull and upper cervical spine: No gross skull lesion. Sinuses/Orbits: Bilateral cataract extraction. Mild mucosal thickening in the paranasal sinuses. Left mastoid effusion. Other: None. IMPRESSION: 1. Severely motion degraded, incomplete examination. 2. Unchanged extent of subacute right ACA infarct. 3. No new infarct identified. 4. Chronic small vessel ischemia with multiple chronic lacunar infarcts as above. Electronically Signed   By: Logan Bores M.D.   On: 04/16/2021 06:52   DG CHEST PORT 1 VIEW  Result Date: 04/16/2021 CLINICAL DATA:  In stage renal disease. EXAM: PORTABLE CHEST 1 VIEW COMPARISON:  04/09/2021 FINDINGS: The cardiac silhouette remains enlarged. Aortic atherosclerosis is noted. New opacity in the right mid and lower lung is compatible with reaccumulation of a moderate-sized pleural effusion with atelectasis or consolidation. The left lung is grossly clear. No pneumothorax is identified. No acute osseous abnormality is seen. IMPRESSION: Reaccumulation of moderate right pleural effusion with right basilar atelectasis or consolidation. Electronically Signed   By: Logan Bores M.D.   On: 04/16/2021 05:39    Review of Systems  Unable to perform ROS: Mental status change  Blood pressure (!) 154/54, pulse 66, temperature 98.1 F (36.7 C), temperature source Oral, resp. rate 18, height '5\' 3"'$  (1.6 m), weight 82.5 kg, SpO2 96 %. Physical Exam Vitals and nursing note reviewed.  Constitutional:      General: She is not in acute distress.    Appearance: Normal appearance. She is obese. She is not ill-appearing.  HENT:     Head: Normocephalic and atraumatic.     Right Ear: External ear normal.      Left Ear: External ear normal.     Nose: Nose normal.     Mouth/Throat:     Mouth: Mucous membranes are dry.     Pharynx: Oropharynx is clear.  Eyes:     Extraocular Movements: Extraocular movements intact.     Conjunctiva/sclera: Conjunctivae normal.  Cardiovascular:     Rate and Rhythm: Normal rate and regular rhythm.     Pulses: Normal pulses.  Musculoskeletal:     Cervical back: Neck supple.  Neurological:     Mental Status: She is alert.    Assessment/Plan: 1.  Chronic kidney disease stage V: With azotemia that indeed could be affecting her mental status.  She does not intend to pursue any forms of renal replacement therapy and is aware of this likely resulting in her mortality.  Whereas she does not have any florid uremic symptoms, would support comfort care/palliative care service as she is likely to have a declining course over the next several weeks and will need additional care.  I have called her daughter Sol Blazing [phone]) and updated her of my findings/management plan  with which she agrees (in line with her mother's wishes). 2.  Acute left lower extremity weakness: With history of recurrent CVAs including a recent 1 last week.  With underlying atrial fibrillation on anticoagulation with Eliquis.  Continue risk factor modification per primary service/neurology.  Initial imaging limited by motion artifact. 3.  History of chronic diastolic heart failure: Appears to be compensated on the current dose of diuresis, continue sodium restriction/fluid restriction with ongoing management including beta-blocker. 4.  Paroxysmal atrial fibrillation: Currently rate controlled and appears to be in sinus rhythm, on Eliquis. 5.  Anemia of chronic disease: Without overt blood loss, continue to trend hemoglobin and hematocrit to decide on need for ESA.  She is a [employer] account [employer] and blood products are prohibited.  Lacretia Tindall K. 04/17/2021, 10:39 AM

## 2021-04-17 NOTE — Progress Notes (Signed)
If somebody doesn't come to the pt's rm when she wants them she throws things (cups of water and anything on table), knocks on table, or shakes siderail

## 2021-04-17 NOTE — Progress Notes (Signed)
PROGRESS NOTE                                                                                                                                                                                                             Patient Demographics:    Tiffany Valencia, is a 75 y.o. female, DOB - 18-Feb-1946, PE:[id number]  Outpatient Primary MD for the patient is Pcp, No    LOS - 1  Admit date - 04/16/2021    Chief Complaint  Patient presents with   Cerebrovascular Accident       Brief Narrative (HPI from H&P)   Tiffany Valencia is a 75 y.o. female with medical history significant for end-stage renal disease declining hemodialysis, type 2 diabetes mellitus, hypertension, paroxysmal atrial fibrillation chronically anticoagulated on Eliquis, chronic diastolic heart failure, recurrent ischemic CVAs who presented for worsening L. Leg weakness was diagnosed with subacute CVA and admitted to the hospital.   Subjective:    Tiffany Valencia today in bed, no distress but unable to answer questions or follow commands reliably.   Assessment  & Plan :     Acute L Leg weakness with H/O recurrent CVAs - H/O Afib on Eliquis - MRI limited and inconclusive ? Extension of recent CVA, Neuro following, on Eliquis + Statin, CVA workup per Neuro, will keep SBP > 150 for now, ? Comfort care due to ESRD.  2. ESRD - patient wishes no HD, Pall Care following, DW daughter 04/17/21 - comfort Rx likely Hospice.  3. Chr. Diastolic CHF EF [id number] - currently compensated on beta-blocker along with diuretic, no ACE/ARB or Entresto due to renal failure.  4. Paroxysmal atrial fibrillation - [place] VASC 2 > 5 - Eliquis and B Blocker.  5. HTN - permissive HTN, goal 150 for now.  6. Obesity - BMI 32 - supportive Rx.   7. Metabolic Encephalopathy - likely due to #1 and #2  - supportive Rx.        Condition - Extremely Guarded  Family Communication  :   daughter Joellyn Rued [phone] 04/17/21   Code Status :  DNR  Consults  :  Renal, Pall Care, Neuro  PUD Prophylaxis :     Procedures  :     MRI -  1. Severely motion degraded, incomplete examination. 2. Unchanged extent of subacute right ACA infarct. 3. No  new infarct identified. 4. Chronic small vessel ischemia with multiple chronic lacunar infarcts .      Disposition Plan  :    Status is: Observation    Dispo: The patient is from: Home              Anticipated d/c is to: SNF              Patient currently is not medically stable to d/c.   Difficult to place patient No   DVT Prophylaxis  :    SCDs Start: 04/16/21 0055   Lab Results  Component Value Date   PLT 165 04/16/2021    Diet :  Diet Order             Diet regular Room service appropriate? Yes; Fluid consistency: Thin  Diet effective now                    Inpatient Medications  Scheduled Meds:    stroke: mapping our early stages of recovery book   Does not apply Once   amLODipine  10 mg Oral Daily   atorvastatin  40 mg Oral Daily   cloNIDine  0.2 mg Oral BID   furosemide  80 mg Oral Daily   hydrALAZINE  25 mg Oral TID   insulin aspart  0-6 Units Subcutaneous TID WC   metoprolol succinate  100 mg Oral Daily   pantoprazole  40 mg Oral Daily   sevelamer carbonate  800 mg Oral TID WC   sodium bicarbonate  1,300 mg Oral BID   Continuous Infusions: PRN Meds:.acetaminophen **OR** acetaminophen, hydrALAZINE  Antibiotics  :    Anti-infectives (From admission, onward)    None        Time Spent in minutes  30   Lala Lund M.D on 04/17/2021 at 10:13 AM  To page go to [email or web address]   [facility] -  Office  [phone]  See all Orders from today for further details    Objective:   Vitals:   04/16/21 2005 04/17/21 0016 04/17/21 0357 04/17/21 0751  BP: (!) 176/83 (!) 154/60 (!) 161/58 (!) 154/54  Pulse: 69 61 66 66  Resp: '20 18 20 18  '$ Temp: 98.1 F (36.7 C)  98.6 F  (37 C) 98.1 F (36.7 C)  TempSrc: Oral  Oral Oral  SpO2: 96% 95% 100% 96%  Weight:      Height:        Wt Readings from Last 3 Encounters:  04/16/21 82.5 kg  04/13/21 82.5 kg     Intake/Output Summary (Last 24 hours) at 04/17/2021 1013 Last data filed at 04/17/2021 0507 Gross per 24 hour  Intake 240 ml  Output 600 ml  Net -360 ml     Physical Exam  Awake but confused, reduced L.sided movement some L. Neglect, unable to answer questions or follow commands reliably  [place].AT,PERRAL Supple Neck, No JVD,   Symmetrical Chest wall movement, Good air movement bilaterally, CTAB RRR,No Gallops,Rubs or new Murmurs, No Parasternal Heave +ve B.Sounds, Abd Soft, No tenderness,   No Cyanosis, Clubbing or edema,      RN pressure injury documentation: Pressure Injury 04/11/21 Sacrum Stage 2 -  Partial thickness loss of dermis presenting as a shallow open injury with a red, pink wound bed without slough. Two Small red open areas noted to middle sacrum and left side of butt (Active)  04/11/21 0900  Location: Sacrum  Location Orientation:   Staging: Stage 2 -  Partial thickness loss of dermis presenting as a shallow open injury with a red, pink wound bed without slough.  Wound Description (Comments): Two Small red open areas noted to middle sacrum and left side of butt  Present on Admission: Yes     Data Review:    CBC Recent Labs  Lab 04/11/21 0702 04/12/21 0630 04/13/21 0536 04/16/21 0215  WBC 5.5 4.8 5.3 5.6  HGB 8.1* 7.9* 8.8* 8.3*  HCT 27.3* 28.1* 30.7* 28.8*  PLT 129* 130* 105* 165  MCV 91.6 91.5 90.8 90.6  MCH 27.2 25.7* 26.0 26.1  MCHC 29.7* 28.1* 28.7* 28.8*  RDW 17.7* 17.7* 17.8* 18.1*    Recent Labs  Lab 04/11/21 0702 04/12/21 0630 04/13/21 0536 04/16/21 0215  NA 138 137 137 136  K 4.0 4.2 4.4 4.8  CL 111 110 109 105  CO2 21* 21* 21* 22  GLUCOSE 111* 90 108* 89  BUN 55* 53* 54* 53*  CREATININE 6.81* 6.75* 6.74* 7.00*  CALCIUM 7.7* 7.7* 7.8* 7.8*   AST  --   --   --  23  ALT  --   --   --  15  ALKPHOS  --   --   --  63  BILITOT  --   --   --  0.7  ALBUMIN 2.1* 2.2* 2.3* 2.0*  MG 1.7  --   --  1.8  BNP 1,127.0*  --   --   --     ------------------------------------------------------------------------------------------------------------------ No results for input(s): CHOL, HDL, LDLCALC, TRIG, CHOLHDL, LDLDIRECT in the last 72 hours.  Lab Results  Component Value Date   HGBA1C 5.9 (H) 04/08/2021   ------------------------------------------------------------------------------------------------------------------ No results for input(s): TSH, T4TOTAL, T3FREE, THYROIDAB in the last 72 hours.  Invalid input(s): FREET3  Cardiac Enzymes No results for input(s): CKMB, TROPONINI, MYOGLOBIN in the last 168 hours.  Invalid input(s): CK ------------------------------------------------------------------------------------------------------------------    Component Value Date/Time   BNP 1,127.0 (H) 04/11/2021 0702     Radiology Reports DG Chest 1 View  Result Date: 04/09/2021 CLINICAL DATA:  RIGHT pleural effusion post thoracentesis EXAM: CHEST  1 VIEW COMPARISON:  Exam at 1341 hours compared to 0451 hours FINDINGS: Resolution of RIGHT pleural effusion. Minimal residual bibasilar atelectasis. No pneumothorax. Enlargement of cardiac silhouette persists. Atherosclerotic calcification aorta. IMPRESSION: No pneumothorax following RIGHT thoracentesis. Minimal bibasilar atelectasis. Electronically Signed   By: Lavonia Dana M.D.   On: 04/09/2021 14:37   DG Chest 1 View  Result Date: 04/08/2021 CLINICAL DATA:  [place] this morning. Low O2 sats. Confusion for 2 days. EXAM: CHEST  1 VIEW COMPARISON:  None. FINDINGS: Heart is enlarged but also accentuated by technique. Moderate RIGHT pleural effusion is noted, with opacification of the RIGHT lung base which obscures the RIGHT hemidiaphragm. Pulmonary vascular congestion is present. LEFT lung is  otherwise clear. No pneumothorax. No acute displaced fractures. IMPRESSION: Cardiomegaly and pulmonary vascular congestion. RIGHT pleural effusion and RIGHT LOWER lobe opacity consistent with atelectasis or infiltrate. Electronically Signed   By: Nolon Nations M.D.   On: 04/08/2021 12:30   CT Head Wo Contrast  Result Date: 04/08/2021 CLINICAL DATA:  Mental status change. EXAM: CT HEAD WITHOUT CONTRAST TECHNIQUE: Contiguous axial images were obtained from the base of the skull through the vertex without intravenous contrast. COMPARISON:  None. FINDINGS: Brain: Ventricles and sulci are prominent compatible with atrophy. Periventricular and subcortical white matter hypodensities compatible with chronic microvascular ischemic changes. Low-attenuation throughout the majority of the left cerebellar hemisphere. Focal chronic  infarct within the right cerebellar hemisphere. No evidence for acute intracranial hemorrhage or significant mass effect. Vascular: Unremarkable. Skull: Intact. Sinuses/Orbits: Paranasal sinuses are well aerated. Mastoid air cells are unremarkable. Other: None. IMPRESSION: Low-attenuation within the majority of the left cerebellar hemisphere may represent subacute to chronic infarct, in the absence of priors. Consider further evaluation with MRI as clinically indicated. Atrophy and chronic microvascular ischemic changes. Electronically Signed   By: Lovey Newcomer M.D.   On: 04/08/2021 12:13   MR ANGIO HEAD WO CONTRAST  Result Date: 04/09/2021 CLINICAL DATA:  Altered mental status, does not communicate EXAM: MRI HEAD WITHOUT CONTRAST MRA HEAD WITHOUT CONTRAST MRA NECK WITHOUT CONTRAST TECHNIQUE: Multiplanar, multiecho pulse sequences of the brain and surrounding structures were obtained without intravenous contrast. Angiographic images of the Circle of Willis were obtained using MRA technique without intravenous contrast. Angiographic images of the neck were obtained using MRA technique without  intravenous contrast. Carotid stenosis measurements (when applicable) are obtained utilizing NASCET criteria, using the distal internal carotid diameter as the denominator. COMPARISON:  Same-day noncontrast CT head FINDINGS: Image quality is significantly degraded by motion artifact. MRI HEAD FINDINGS Brain: There is diffusion restriction in the right ACA distribution involving the right aspect of the genu of the corpus callosum and right parasagittal frontal lobe. The FLAIR sequences markedly motion degraded, but there appears to be associated FLAIR signal abnormality. Findings consistent with evolving early subacute infarct. Susceptibility artifact along the falx on the T2* sequence is likely due to bulky dural calcifications seen on the same-day head CT. There is no convincing evidence of hemorrhagic transformation. There is a large remote infarct in the left cerebellar hemisphere and a small remote infarct in the right cerebellar hemisphere. There is a background of mild-to-moderate global parenchymal volume loss with enlargement of the ventricular system and extra-axial CSF spaces. Additional foci of T2/FLAIR signal abnormality in the subcortical and periventricular white matter likely reflects sequela of chronic white matter microangiopathy. Vascular: The major intracranial flow voids are present. Skull and upper cervical spine: Normal marrow signal. Sinuses/Orbits: The imaged paranasal sinuses are clear. Bilateral lens implants are in place. The globes and orbits are otherwise unremarkable. Other: There is a left mastoid effusion. MRA HEAD FINDINGS Evaluation of the intracranial vasculature is markedly degraded by motion artifact. Anterior circulation: The right intracranial ICA appears patent. Evaluation of the left intracranial ICA is markedly degraded, but appears to be patent. The bilateral M1 segments are patent. The distal branches appear overall patent, but are not well evaluated. The proximal anterior  cerebral arteries are patent. The distal anterior cerebral arteries are not well evaluated. Anterior circulation aneurysm can not be excluded. Posterior circulation: The V4 segments of the vertebral arteries are patent. The basilar artery is patent. The proximal PCAs are patent. The distal branches are not well evaluated. Posterior circulation aneurysms can not be excluded. Anatomic variants: The posterior communicating arteries are not definitively identified. MRA NECK FINDINGS Evaluation of the vasculature of the neck is markedly degraded by motion artifact. Aortic arch: Not evaluated. Right carotid System: Time-of-flight MRA demonstrates antegrade flow in the right carotid system. Stenosis or dissection can not be evaluated. Left carotid System: Time-of-flight MRA demonstrates antegrade flow in the left carotid system. Stenosis or dissection can not be evaluated. Vertebral arteries: Time-of-flight MRA demonstrates antegrade flow in the vertebral arteries. Stenosis or dissection can not be evaluated. IMPRESSION: 1. Markedly motion degraded study with essentially nondiagnostic MRA of the head and neck. 2. Early subacute infarct in  the right ACA distribution without definite evidence of hemorrhagic transformation. 3. The intracranial vasculature is markedly suboptimally evaluated. The proximal vessels appear grossly patent. The distal vasculature is not evaluated. 4. Time-of-flight MRA demonstrates antegrade flow in the carotid systems and vertebral arteries. Stenoses or dissection can not be evaluated. 5. 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 6. Consider CTA of the head/neck for evaluation of the vasculature as indicated given reduced susceptibility to motion artifact. Electronically Signed   By: Valetta Mole M.D.   On: 04/09/2021 11:08   MR ANGIO NECK WO CONTRAST  Result Date: 04/09/2021 CLINICAL DATA:  Altered mental status, does not communicate EXAM: MRI HEAD WITHOUT  CONTRAST MRA HEAD WITHOUT CONTRAST MRA NECK WITHOUT CONTRAST TECHNIQUE: Multiplanar, multiecho pulse sequences of the brain and surrounding structures were obtained without intravenous contrast. Angiographic images of the Circle of Willis were obtained using MRA technique without intravenous contrast. Angiographic images of the neck were obtained using MRA technique without intravenous contrast. Carotid stenosis measurements (when applicable) are obtained utilizing NASCET criteria, using the distal internal carotid diameter as the denominator. COMPARISON:  Same-day noncontrast CT head FINDINGS: Image quality is significantly degraded by motion artifact. MRI HEAD FINDINGS Brain: There is diffusion restriction in the right ACA distribution involving the right aspect of the genu of the corpus callosum and right parasagittal frontal lobe. The FLAIR sequences markedly motion degraded, but there appears to be associated FLAIR signal abnormality. Findings consistent with evolving early subacute infarct. Susceptibility artifact along the falx on the T2* sequence is likely due to bulky dural calcifications seen on the same-day head CT. There is no convincing evidence of hemorrhagic transformation. There is a large remote infarct in the left cerebellar hemisphere and a small remote infarct in the right cerebellar hemisphere. There is a background of mild-to-moderate global parenchymal volume loss with enlargement of the ventricular system and extra-axial CSF spaces. Additional foci of T2/FLAIR signal abnormality in the subcortical and periventricular white matter likely reflects sequela of chronic white matter microangiopathy. Vascular: The major intracranial flow voids are present. Skull and upper cervical spine: Normal marrow signal. Sinuses/Orbits: The imaged paranasal sinuses are clear. Bilateral lens implants are in place. The globes and orbits are otherwise unremarkable. Other: There is a left mastoid effusion. MRA HEAD  FINDINGS Evaluation of the intracranial vasculature is markedly degraded by motion artifact. Anterior circulation: The right intracranial ICA appears patent. Evaluation of the left intracranial ICA is markedly degraded, but appears to be patent. The bilateral M1 segments are patent. The distal branches appear overall patent, but are not well evaluated. The proximal anterior cerebral arteries are patent. The distal anterior cerebral arteries are not well evaluated. Anterior circulation aneurysm can not be excluded. Posterior circulation: The V4 segments of the vertebral arteries are patent. The basilar artery is patent. The proximal PCAs are patent. The distal branches are not well evaluated. Posterior circulation aneurysms can not be excluded. Anatomic variants: The posterior communicating arteries are not definitively identified. MRA NECK FINDINGS Evaluation of the vasculature of the neck is markedly degraded by motion artifact. Aortic arch: Not evaluated. Right carotid System: Time-of-flight MRA demonstrates antegrade flow in the right carotid system. Stenosis or dissection can not be evaluated. Left carotid System: Time-of-flight MRA demonstrates antegrade flow in the left carotid system. Stenosis or dissection can not be evaluated. Vertebral arteries: Time-of-flight MRA demonstrates antegrade flow in the vertebral arteries. Stenosis or dissection can not be evaluated. IMPRESSION: 1. Markedly motion degraded study with  essentially nondiagnostic MRA of the head and neck. 2. Early subacute infarct in the right ACA distribution without definite evidence of hemorrhagic transformation. 3. The intracranial vasculature is markedly suboptimally evaluated. The proximal vessels appear grossly patent. The distal vasculature is not evaluated. 4. Time-of-flight MRA demonstrates antegrade flow in the carotid systems and vertebral arteries. Stenoses or dissection can not be evaluated. 5. Remote infarcts in the bilateral  cerebellar hemispheres, left worse than right. 6. Consider CTA of the head/neck for evaluation of the vasculature as indicated given reduced susceptibility to motion artifact. Electronically Signed   By: Valetta Mole M.D.   On: 04/09/2021 11:08   MR BRAIN WO CONTRAST  Result Date: 04/16/2021 CLINICAL DATA:  Neuro deficit, acute, stroke suspected. EXAM: MRI HEAD WITHOUT CONTRAST TECHNIQUE: Multiplanar, multiecho pulse sequences of the brain and surrounding structures were obtained without intravenous contrast. COMPARISON:  04/09/2021 FINDINGS: The study is severely motion degraded and was terminated prior to completion. Axial and coronal diffusion, sagittal T1, axial T2, and axial FLAIR sequences were obtained. Brain: A large subacute right ACA infarct is unchanged in extent from the prior MRI. Associated cytotoxic edema has increased without significant mass effect. No new infarct is identified. Encephalomalacia is again noted anteriorly in the left frontal lobe and in the left greater than right cerebellar hemispheres. Chronic small vessel ischemia is again noted in the cerebral white matter bilaterally with chronic lacunar infarcts in the basal ganglia, left thalamus, and pons. No gross intracranial hemorrhage, midline shift, or extra-axial fluid collection is identified. There is mild cerebral atrophy. Vascular: Major intracranial arterial flow voids are grossly preserved. Skull and upper cervical spine: No gross skull lesion. Sinuses/Orbits: Bilateral cataract extraction. Mild mucosal thickening in the paranasal sinuses. Left mastoid effusion. Other: None. IMPRESSION: 1. Severely motion degraded, incomplete examination. 2. Unchanged extent of subacute right ACA infarct. 3. No new infarct identified. 4. Chronic small vessel ischemia with multiple chronic lacunar infarcts as above. Electronically Signed   By: Logan Bores M.D.   On: 04/16/2021 06:52   MR BRAIN WO CONTRAST  Result Date: 04/09/2021 CLINICAL  DATA:  Altered mental status, does not communicate EXAM: MRI HEAD WITHOUT CONTRAST MRA HEAD WITHOUT CONTRAST MRA NECK WITHOUT CONTRAST TECHNIQUE: Multiplanar, multiecho pulse sequences of the brain and surrounding structures were obtained without intravenous contrast. Angiographic images of the Circle of Willis were obtained using MRA technique without intravenous contrast. Angiographic images of the neck were obtained using MRA technique without intravenous contrast. Carotid stenosis measurements (when applicable) are obtained utilizing NASCET criteria, using the distal internal carotid diameter as the denominator. COMPARISON:  Same-day noncontrast CT head FINDINGS: Image quality is significantly degraded by motion artifact. MRI HEAD FINDINGS Brain: There is diffusion restriction in the right ACA distribution involving the right aspect of the genu of the corpus callosum and right parasagittal frontal lobe. The FLAIR sequences markedly motion degraded, but there appears to be associated FLAIR signal abnormality. Findings consistent with evolving early subacute infarct. Susceptibility artifact along the falx on the T2* sequence is likely due to bulky dural calcifications seen on the same-day head CT. There is no convincing evidence of hemorrhagic transformation. There is a large remote infarct in the left cerebellar hemisphere and a small remote infarct in the right cerebellar hemisphere. There is a background of mild-to-moderate global parenchymal volume loss with enlargement of the ventricular system and extra-axial CSF spaces. Additional foci of T2/FLAIR signal abnormality in the subcortical and periventricular white matter likely reflects sequela of chronic white  matter microangiopathy. Vascular: The major intracranial flow voids are present. Skull and upper cervical spine: Normal marrow signal. Sinuses/Orbits: The imaged paranasal sinuses are clear. Bilateral lens implants are in place. The globes and orbits are  otherwise unremarkable. Other: There is a left mastoid effusion. MRA HEAD FINDINGS Evaluation of the intracranial vasculature is markedly degraded by motion artifact. Anterior circulation: The right intracranial ICA appears patent. Evaluation of the left intracranial ICA is markedly degraded, but appears to be patent. The bilateral M1 segments are patent. The distal branches appear overall patent, but are not well evaluated. The proximal anterior cerebral arteries are patent. The distal anterior cerebral arteries are not well evaluated. Anterior circulation aneurysm can not be excluded. Posterior circulation: The V4 segments of the vertebral arteries are patent. The basilar artery is patent. The proximal PCAs are patent. The distal branches are not well evaluated. Posterior circulation aneurysms can not be excluded. Anatomic variants: The posterior communicating arteries are not definitively identified. MRA NECK FINDINGS Evaluation of the vasculature of the neck is markedly degraded by motion artifact. Aortic arch: Not evaluated. Right carotid System: Time-of-flight MRA demonstrates antegrade flow in the right carotid system. Stenosis or dissection can not be evaluated. Left carotid System: Time-of-flight MRA demonstrates antegrade flow in the left carotid system. Stenosis or dissection can not be evaluated. Vertebral arteries: Time-of-flight MRA demonstrates antegrade flow in the vertebral arteries. Stenosis or dissection can not be evaluated. IMPRESSION: 1. Markedly motion degraded study with essentially nondiagnostic MRA of the head and neck. 2. Early subacute infarct in the right ACA distribution without definite evidence of hemorrhagic transformation. 3. The intracranial vasculature is markedly suboptimally evaluated. The proximal vessels appear grossly patent. The distal vasculature is not evaluated. 4. Time-of-flight MRA demonstrates antegrade flow in the carotid systems and vertebral arteries. Stenoses or  dissection can not be evaluated. 5. 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 6. Consider CTA of the head/neck for evaluation of the vasculature as indicated given reduced susceptibility to motion artifact. Electronically Signed   By: Valetta Mole M.D.   On: 04/09/2021 11:08   US RENAL  Result Date: 04/09/2021 CLINICAL DATA:  Pulmonary edema, pleural effusion, diabetes mellitus, hypertension, altered mental EXAM: RENAL / URINARY TRACT ULTRASOUND COMPLETE COMPARISON:  None FINDINGS: Right Kidney: Renal measurements: 10.3 x 5.6 x 5.5 cm = volume: 164 mL. Normal cortical thickness. Significantly increased cortical echogenicity. No hydronephrosis or shadowing calcification. Multiple RIGHT renal cysts, largest 2.2 cm and 2.3 cm in greatest sizes. Left Kidney: No LEFT kidney visualized, question obscured, ectopic, or surgically versus developmentally absent. Bladder: Appears normal for degree of bladder distention. Other: N/A IMPRESSION: Nonvisualization of LEFT kidney. Medical renal disease changes RIGHT kidney with multiple renal cysts. Electronically Signed   By: Lavonia Dana M.D.   On: 04/09/2021 14:39   DG CHEST PORT 1 VIEW  Result Date: 04/16/2021 CLINICAL DATA:  In stage renal disease. EXAM: PORTABLE CHEST 1 VIEW COMPARISON:  04/09/2021 FINDINGS: The cardiac silhouette remains enlarged. Aortic atherosclerosis is noted. New opacity in the right mid and lower lung is compatible with reaccumulation of a moderate-sized pleural effusion with atelectasis or consolidation. The left lung is grossly clear. No pneumothorax is identified. No acute osseous abnormality is seen. IMPRESSION: Reaccumulation of moderate right pleural effusion with right basilar atelectasis or consolidation. Electronically Signed   By: Logan Bores M.D.   On: 04/16/2021 05:39   DG Chest Port 1 View  Result Date: 04/09/2021 CLINICAL DATA:  75 year old  female with history of right pleural effusion and  pulmonary edema. EXAM: PORTABLE CHEST 1 VIEW COMPARISON:  Chest x-ray 04/08/2021. FINDINGS: Opacity in the base of the right hemithorax compatible with atelectasis and/or consolidation, with superimposed moderate to large right pleural effusion. Left lung appears clear. No left pleural effusion. Cephalization of the pulmonary vasculature. Heart size is mildly enlarged. Atherosclerotic calcifications in the thoracic aorta. IMPRESSION: 1. Persistent moderate to large right pleural effusion with atelectasis and/or consolidation in the right lung base. 2. Mild cardiomegaly. 3. Aortic atherosclerosis. 1. Electronically Signed   By: Vinnie Langton M.D.   On: 04/09/2021 05:22   ECHOCARDIOGRAM COMPLETE  Result Date: 04/09/2021    ECHOCARDIOGRAM REPORT   Patient Name:   MARITES WAYSON Date of Exam: 04/09/2021 Medical Rec #:  [id number]         Height:       63.5 in Accession #:    [id number]        Weight:       192.7 lb Date of Birth:  October 09, 1945         BSA:          1.914 m Patient Age:    34 years          BP:           167/86 mmHg Patient Gender: F                 HR:           82 bpm. Exam Location:  [facility] Procedure: 2D Echo, Cardiac Doppler and Color Doppler Indications:    Congestive Heart Failure  History:        Patient has no prior history of Echocardiogram examinations.                 CHF, CAD, Arrythmias:Atrial Fibrillation; Risk Factors:Diabetes                 and Hypertension.  Sonographer:    Wenda Low Referring Phys: [place]  1. Left ventricular ejection fraction, by estimation, is 60 to 65%. The left ventricle has normal function. The left ventricle has no regional wall motion abnormalities. There is severe concentric left ventricular hypertrophy. Left ventricular diastolic  parameters are consistent with Grade II diastolic dysfunction (pseudonormalization). There is the interventricular septum is flattened in systole and diastole, consistent with right  ventricular pressure and volume overload.  2. Right ventricular systolic function is mildly reduced. The right ventricular size is mildly enlarged. Mildly increased right ventricular wall thickness. There is severely elevated pulmonary artery systolic pressure. The estimated right ventricular systolic pressure is [id number] mmHg.  3. Left atrial size was moderately dilated.  4. The mitral valve is grossly normal. Mild mitral valve regurgitation.  5. Tricuspid valve regurgitation is moderate.  6. The aortic valve is tricuspid. Aortic valve regurgitation is not visualized. Mild aortic valve sclerosis is present, with no evidence of aortic valve stenosis. Aortic valve mean gradient measures 5.0 mmHg.  7. The inferior vena cava is dilated in size with <50% respiratory variability, suggesting right atrial pressure of 15 mmHg. Comparison(s): No prior Echocardiogram. FINDINGS  Left Ventricle: Left ventricular ejection fraction, by estimation, is 60 to 65%. The left ventricle has normal function. The left ventricle has no regional wall motion abnormalities. The left ventricular internal cavity size was normal in size. There is  severe concentric left ventricular hypertrophy. The interventricular septum is flattened in systole and diastole, consistent  with right ventricular pressure and volume overload. Left ventricular diastolic parameters are consistent with Grade II diastolic dysfunction (pseudonormalization). Right Ventricle: The right ventricular size is mildly enlarged. Mildly increased right ventricular wall thickness. Right ventricular systolic function is mildly reduced. There is severely elevated pulmonary artery systolic pressure. The tricuspid regurgitant velocity is 3.46 m/s, and with an assumed right atrial pressure of 15 mmHg, the estimated right ventricular systolic pressure is [id number] mmHg. Left Atrium: Left atrial size was moderately dilated. Right Atrium: Right atrial size was normal in size. Pericardium: There is  no evidence of pericardial effusion. Mitral Valve: The mitral valve is grossly normal. Mild mitral valve regurgitation. MV peak gradient, 4.0 mmHg. The mean mitral valve gradient is 1.0 mmHg. Tricuspid Valve: The tricuspid valve is grossly normal. Tricuspid valve regurgitation is moderate. Aortic Valve: The aortic valve is tricuspid. There is mild aortic valve annular calcification. Aortic valve regurgitation is not visualized. Mild aortic valve sclerosis is present, with no evidence of aortic valve stenosis. Aortic valve mean gradient measures 5.0 mmHg. Aortic valve peak gradient measures 9.1 mmHg. Aortic valve area, by VTI measures 1.69 cm. Pulmonic Valve: The pulmonic valve was grossly normal. Pulmonic valve regurgitation is trivial. Aorta: The aortic root is normal in size and structure. Venous: The inferior vena cava is dilated in size with less than 50% respiratory variability, suggesting right atrial pressure of 15 mmHg. IAS/Shunts: No atrial level shunt detected by color flow Doppler.  LEFT VENTRICLE PLAX 2D LVIDd:         4.30 cm  Diastology LVIDs:         2.90 cm  LV e' medial:    3.78 cm/s LV PW:         1.90 cm  LV E/e' medial:  22.3 LV IVS:        1.40 cm  LV e' lateral:   10.10 cm/s LVOT diam:     2.00 cm  LV E/e' lateral: 8.4 LV SV:         63 LV SV Index:   33 LVOT Area:     3.14 cm  RIGHT VENTRICLE RV Basal diam:  4.10 cm RV Mid diam:    3.20 cm LEFT ATRIUM              Index       RIGHT ATRIUM           Index LA diam:        4.30 cm  2.25 cm/m  RA Area:     18.40 cm LA Vol (A2C):   106.0 ml 55.37 ml/m RA Volume:   46.20 ml  24.13 ml/m LA Vol (A4C):   77.1 ml  40.27 ml/m LA Biplane Vol: 90.7 ml  47.38 ml/m  AORTIC VALVE                    PULMONIC VALVE AV Area (Vmax):    1.83 cm     PV Vmax:       0.77 m/s AV Area (Vmean):   1.80 cm     PV Peak grad:  2.4 mmHg AV Area (VTI):     1.69 cm AV Vmax:           151.00 cm/s AV Vmean:          102.000 cm/s AV VTI:            0.372 m AV Peak  Grad:      9.1 mmHg AV Mean  Grad:      5.0 mmHg LVOT Vmax:         88.10 cm/s LVOT Vmean:        58.500 cm/s LVOT VTI:          0.200 m LVOT/AV VTI ratio: 0.54  AORTA Ao Root diam: 3.00 cm MITRAL VALVE               TRICUSPID VALVE MV Area (PHT): 2.68 cm    TR Peak grad:   47.9 mmHg MV Area VTI:   1.79 cm    TR Vmax:        346.00 cm/s MV Peak grad:  4.0 mmHg MV Mean grad:  1.0 mmHg    SHUNTS MV Vmax:       1.00 m/s    Systemic VTI:  0.20 m MV Vmean:      51.8 cm/s   Systemic Diam: 2.00 cm MV Decel Time: 283 msec MV E velocity: 84.40 cm/s MV A velocity: 78.40 cm/s MV E/A ratio:  1.08 Rozann Lesches MD Electronically signed by Rozann Lesches MD Signature Date/Time: 04/09/2021/4:11:51 PM    Final    CT HEAD CODE STROKE WO CONTRAST  Result Date: 04/09/2021 CLINICAL DATA:  Code stroke.  Altered mental status EXAM: CT HEAD WITHOUT CONTRAST TECHNIQUE: Contiguous axial images were obtained from the base of the skull through the vertex without intravenous contrast. COMPARISON:  Head CT from yesterday FINDINGS: Brain: New cytotoxic edema in the parasagittal right frontal lobe, ACA distribution. Chronic small vessel ischemia in the hemispheric white matter with chronic basal ganglia lacunar infarcts. Chronic left larger than right cerebellar infarcts. Encephalomalacia in the left frontal lobe at the anterior cranial fossa and anterior parasagittal region, likely also post ischemic. Vascular: No hyperdense vessel or unexpected calcification. Skull: Normal. Negative for fracture or focal lesion. Sinuses/Orbits: Bilateral cataract resection Other: These results were communicated to Dr. Wynetta Emery at 9:40 am on 04/09/2021 by text page via the [facility] messaging system. IMPRESSION: 1. Acute cortical infarct is newly seen in the parasagittal right frontal lobe, ACA branch distribution. 2. Chronic ischemic injury as described, including remote left ACA territory infarcts. Electronically Signed   By: Jorje Guild M.D.   On:  04/09/2021 09:42   US THORACENTESIS ASP PLEURAL SPACE W/IMG GUIDE  Result Date: 04/09/2021 INDICATION: RIGHT pleural effusion EXAM: ULTRASOUND GUIDED DIAGNOSTIC AND THERAPEUTIC RIGHT THORACENTESIS MEDICATIONS: None. COMPLICATIONS: None immediate. PROCEDURE: An ultrasound guided thoracentesis was thoroughly discussed with the patient and questions answered. The benefits, risks, alternatives and complications were also discussed. The patient understands and wishes to proceed with the procedure. Written consent was obtained. Ultrasound was performed to localize and mark an adequate pocket of fluid in the RIGHT chest. The area was then prepped and draped in the normal sterile fashion. 1% Lidocaine was used for local anesthesia. Under ultrasound guidance a 5 [place] Yueh catheter was introduced. Thoracentesis was performed. The catheter was removed and a dressing applied. FINDINGS: A total of approximately 1 L of yellow RIGHT pleural fluid was removed. Samples were sent to the laboratory as requested by the clinical team. IMPRESSION: Successful ultrasound guided RIGHT thoracentesis yielding 1 L of pleural fluid. Electronically Signed   By: Lavonia Dana M.D.   On: 04/09/2021 14:23

## 2021-04-17 NOTE — Progress Notes (Signed)
ANTICOAGULATION CONSULT NOTE - Initial Consult  Pharmacy Consult for Apixaban Indication: atrial fibrillation and recurrent CVA  No Known Allergies  Patient Measurements: Height: '5\' 3"'$  (160 cm) Weight: 82.5 kg (181 lb 14.1 oz) IBW/kg (Calculated) : 52.4  Vital Signs: Temp: 98.1 F (36.7 C) (10/11 0751) Temp Source: Oral (10/11 0751) BP: 154/54 (10/11 0751) Pulse Rate: 66 (10/11 0751)  Labs: Recent Labs    04/16/21 0215  HGB 8.3*  HCT 28.8*  PLT 165  CREATININE 7.00*    Estimated Creatinine Clearance: 7 mL/min (A) (by C-G formula based on SCr of 7 mg/dL (H)).   Medical History: Past Medical History:  Diagnosis Date   Anemia in chronic kidney disease (CKD)    Asthma    Chronic kidney disease    Coronary artery disease    Diabetes mellitus without complication ([place])    Hypertension    Assessment:  75 yr old with recent admit at [facility] (10/2-10/7/22) for CVA admitted to [facility] 10/10 with recurrent CVA.  On Apixaban 2.5 mg BID PTA for atrial fibrillation and prior CVA.  Last dose 10/9 at 9am.    Of note, apixaban held for 3 days post-stroke during last admit, then resumed on 10/6 am with 5 mg BID.  5 mg given 10/6 and 10/7, then discharged on prior dose of 2.5 mg BID.  Hgb low stable, noted Jehov[employer]'s Witness and blood products prohibited.    Under 36 yrs old, weight > 60 kg and creatinine >1.5.   Qualifies for full-dose Apixaban with only 1 of 3 factors for reducing dose.    Goal of Therapy:  Appropriate Apixaban regimen for indication Monitor platelets by anticoagulation protocol: Yes   Plan:  Apixaban 5 mg PO BID. Monitor for signs/symptoms of bleeding.  Arty Baumgartner, RPh 04/17/2021,11:10 AM

## 2021-04-17 NOTE — Progress Notes (Signed)
Pt refused all morning medications, RN offered three times, from 915 through 1200. Pt accepted drink of 4 oz. Orange juice and 4 oz cranberry juice. Refused breakfast. Pt educated on benefits of medications, will continue to monitor. MD Candiss Norse notified.

## 2021-04-18 DIAGNOSIS — Z515 Encounter for palliative care: Secondary | ICD-10-CM

## 2021-04-18 LAB — RESP PANEL BY RT-PCR (FLU A&B, COVID) ARPGX2
Influenza A by PCR: NEGATIVE
Influenza B by PCR: NEGATIVE
SARS Coronavirus 2 by RT PCR: NEGATIVE

## 2021-04-18 LAB — GLUCOSE, CAPILLARY
Glucose-Capillary: 81 mg/dL (ref 70–99)
Glucose-Capillary: 125 mg/dL — ABNORMAL HIGH (ref 70–99)
Glucose-Capillary: 117 mg/dL — ABNORMAL HIGH (ref 70–99)
Glucose-Capillary: 114 mg/dL — ABNORMAL HIGH (ref 70–99)
Glucose-Capillary: 140 mg/dL — ABNORMAL HIGH (ref 70–99)

## 2021-04-18 MED ORDER — TRAMADOL HCL 50 MG PO TABS
25.0000 mg | ORAL_TABLET | Freq: Two times a day (BID) | ORAL | Status: DC | PRN
  Administered 2021-04-18: 25 mg via ORAL
  Filled 2021-04-18: qty 1

## 2021-04-18 NOTE — NC FL2 (Signed)
[place] LEVEL OF CARE SCREENING TOOL     IDENTIFICATION  Patient Name: Tiffany Valencia Birthdate: Aug 08, 1945 Sex: female Admission Date (Current Location): 04/16/2021  [facility] and [place] Number:  [employer] and Address:  [facility], [place] [address] [place],  [address]      Provider Number: [id number]  Attending Physician Name and Address:  Thurnell Lose, MD  Relative Name and Phone Number:       Current Level of Care: Hospital Recommended Level of Care: [place] Prior Approval Number:    Date Approved/Denied:   PASRR Number:    Discharge Plan: SNF    Current Diagnoses: Patient Active Problem List   Diagnosis Date Noted   Stroke [facility]) 04/16/2021   Hypertension    ESRD (end stage renal disease) ([place])    Chronic diastolic CHF (congestive heart failure) ([place])    Goals of care, counseling/discussion    Pressure injury of skin 04/12/2021   AKI (acute kidney injury) ([place])    Cerebrovascular accident (CVA) due to embolism of right anterior cerebral artery ([place])    Acute heart failure ([place]) 04/08/2021   Gait instability 04/08/2021   DM (diabetes mellitus), type 2 with peripheral vascular complications ([place]) [id number]   Benign hypertension with CKD (chronic kidney disease) stage IV ([place]) 04/08/2021   Acquired thrombophilia ([place]) 04/08/2021   Chronic anticoagulation 04/08/2021   AF (paroxysmal atrial fibrillation) ([place]) 04/08/2021   Hypertensive urgency 04/08/2021   Anemia in chronic kidney disease (CKD)    Pleural effusion on right    Altered mental state    Volume overload    Metabolic acidosis    Elevated brain natriuretic peptide (BNP) level    Hypoalbuminemia    Dysarthria    Coronary artery disease s/p STENT    Acute respiratory failure with hypoxia (HCC)     Orientation RESPIRATION BLADDER Height & Weight     Self, Time, Situation, Place  Normal Incontinent Weight: 169 lb  12.1 oz (77 kg) Height:  '5\' 3"'$  (160 cm)  BEHAVIORAL SYMPTOMS/MOOD NEUROLOGICAL BOWEL NUTRITION STATUS      Continent Diet (regular)  AMBULATORY STATUS COMMUNICATION OF NEEDS Skin   Extensive Assist Verbally PU Stage and Appropriate Care   PU Stage 2 Dressing:  (sacrum, foam dressing: lift every shift to assess, change PRN)                   Personal Care Assistance Level of Assistance  Bathing, Feeding, Dressing Bathing Assistance: Limited assistance Feeding assistance: Independent Dressing Assistance: Limited assistance     Functional Limitations Info  Hearing   Hearing Info: Impaired (hard of hearing)      SPECIAL CARE FACTORS FREQUENCY  PT (By licensed PT), OT (By licensed OT)     PT Frequency: 5x/wk OT Frequency: 5x/wk            Contractures Contractures Info: Not present    Additional Factors Info  Code Status, Allergies, Insulin Sliding Scale Code Status Info: DNR Allergies Info: NKA   Insulin Sliding Scale Info: see DC summary       Current Medications (04/18/2021):  This is the current hospital active medication list Current Facility-Administered Medications  Medication Dose Route Frequency Provider Last Rate Last Admin    stroke: mapping our early stages of recovery book   Does not apply Once Howerter, Justin B, DO       acetaminophen (TYLENOL) tablet 650 mg  650 mg  Oral Q6H PRN Howerter, Justin B, DO   650 mg at 04/18/21 [id number]   Or   acetaminophen (TYLENOL) suppository 650 mg  650 mg Rectal Q6H PRN Howerter, Justin B, DO       apixaban (ELIQUIS) tablet 5 mg  5 mg Oral BID Skeet Simmer, RPH   5 mg at 04/18/21 1016   atorvastatin (LIPITOR) tablet 40 mg  40 mg Oral Daily Howerter, Justin B, DO   40 mg at 04/18/21 1015   cloNIDine (CATAPRES) tablet 0.1 mg  0.1 mg Oral TID PRN Thurnell Lose, MD       furosemide (LASIX) tablet 80 mg  80 mg Oral Daily Howerter, Justin B, DO   80 mg at 04/18/21 1016   hydrALAZINE (APRESOLINE) injection 10 mg  10 mg  Intravenous Q4H PRN Howerter, Justin B, DO       hydrALAZINE (APRESOLINE) tablet 25 mg  25 mg Oral TID Nicolette Bang, MD   25 mg at 04/18/21 1016   insulin aspart (novoLOG) injection 0-6 Units  0-6 Units Subcutaneous TID WC Howerter, Justin B, DO       metoprolol succinate (TOPROL-XL) 24 hr tablet 100 mg  100 mg Oral Daily Howerter, Justin B, DO   100 mg at 04/18/21 1015   pantoprazole (PROTONIX) EC tablet 40 mg  40 mg Oral Daily Howerter, Justin B, DO   40 mg at 04/18/21 1015   sevelamer carbonate (RENVELA) tablet 800 mg  800 mg Oral TID WC Howerter, Justin B, DO   800 mg at 04/18/21 1233   sodium bicarbonate tablet 1,300 mg  1,300 mg Oral BID Howerter, Justin B, DO   1,300 mg at 04/18/21 1016     Discharge Medications: Please see discharge summary for a list of discharge medications.  Relevant Imaging Results:  Relevant Lab Results:   Additional Information SS#: [id number]  Geralynn Ochs, LCSW

## 2021-04-18 NOTE — Progress Notes (Addendum)
Daily Progress Note   Patient Name: Tiffany Valencia       Date: 04/18/2021 DOB: [birth year]  Age: 75 y.o. MRN#: [id number] Attending Physician: Thurnell Lose, MD Primary Care Physician: Pcp, No Admit Date: 04/16/2021  Reason for Consultation/Follow-up: Establishing goals of care  Subjective: Chart reviewed. Note that patient was seen by nephrology yesterday 10/11 and continues to state she does not intend to pursue any forms of renal replacement therapy.   10:45 - Patient is alert, sitting up in bed eating cereal. She tells me her hands are swollen and painful. She tells me she "they keep putting needles in my hand". She tells me that her daughter/Tiffany Valencia is her main Media planner. She confirms that she does not want to do dialysis.  11:45 - I met with daughters Tiffany Valencia and Tiffany Valencia. They want to support their mother's wishes not to pursue dialysis. They want to let her be as comfortable as possible. They verbalizes understanding that her prognosis is limited (weeks to months). Tiffany Valencia lives in [place], [place] and wants to bring her mother to lives in her house. I recommended the addition of support support in the home. Provided education and counseling on the philosophy and benefits of hospice care. Discussed that it offers a holistic approach to care in the setting of end-stage illness/disease, and is about supporting the patient where, while allowing the natural course to occur. Discussed that hospice can provide personal care, DME, support for the family, and help keep patient out of the hospital. Family is agreeable for a hospice referral to be made. They would need a hospital bed and a wheelchair. Discussed that transportation by ambulance would be out-of-pocket.   Plan of care discussed  with Dr. Candiss Norse and [facility] team.  Length of Stay: 2   Physical Exam Vitals reviewed.  Constitutional:      General: She is not in acute distress.    Comments: Chronically ill-appearing  Pulmonary:     Effort: Pulmonary effort is normal.  Neurological:     Mental Status: She is alert and oriented to person, place, and time.     Motor: Weakness present.            Vital Signs: BP (!) 141/106 (BP Location: Right Arm)   Pulse 75   Temp 98.9 F (37.2 C)  Resp 18   Ht _0  (1.6 m)   Wt 77 kg   SpO2 98%   BMI 30.07 kg/m  SpO2: SpO2: 98 % O2 Device: O2 Device: Room Air O2 Flow Rate:    Intake/output summary:  Intake/Output Summary (Last 24 hours) at 04/18/2021 1041 Last data filed at 04/18/2021 0410 Gross per 24 hour  Intake --  Output 600 ml  Net -600 ml   LBM: Last BM Date: 04/16/21 Baseline Weight: Weight: 82.5 kg Most recent weight: Weight: 77 kg       Palliative Assessment/Data: PPS 30%      Palliative Care Assessment & Plan   HPI/Patient Profile: 75 y.o. female  with past medical history of end-stage renal disease declining hemodialysis, type 2 diabetes mellitus, hypertension, paroxysmal atrial fibrillation chronically anticoagulated on Eliquis, chronic diastolic heart failure, recurrent ischemic CVAs admitted on 04/16/2021 from SNF with acute right lower extremity weakness.    The patient was recently hospitalized at [facility] from 04-08-21 to 04-13-21, initially for acute on chronic diastolic heart failure. She then had acute embolic stroke involving the right ACA. Patient's family reports this is now her 5th stroke on current admission. PMT has been consulted to assist with goals of care conversation.   Assessment: - acute left leg weakness - history of recurrent CVAs - ESRD - chronic diastolic CHF - paroxysmal a-fib - metabolic encephalopathy  Recommendations/Plan: Continue current supportive care Patient has expressed she does not want to pursue  dialysis; family is supportive of her wishes Family wants to bring patient back to [place] with home hospice - referral to be made to [facility] PT evaluation for patient to be able to transfer from wheelchair to car PMT will continue to follow  Goals of Care and Additional Recommendations: Limitations on Scope of Treatment: No Hemodialysis  Code Status: DNR/DNI   Prognosis:  < 6 months  Discharge Planning: To Be Determined    Thank you for allowing the Palliative Medicine Team to assist in the care of this patient.   Total Time 35 minutes Prolonged Time Billed  no       Greater than 50%  of this time was spent counseling and coordinating care related to the above assessment and plan.  Tiffany Bullion, NP  Please contact Palliative Medicine Team phone at [phone] for questions and concerns.

## 2021-04-18 NOTE — Progress Notes (Signed)
PROGRESS NOTE                                                                                                                                                                                                             Patient Demographics:    Tiffany Valencia, is a 75 y.o. female, DOB - [birth year], HR:[id number]  Outpatient Primary MD for the patient is Pcp, No    LOS - 2  Admit date - 04/16/2021    Chief Complaint  Patient presents with   Cerebrovascular Accident       Brief Narrative (HPI from H&P)   Tiffany Valencia is a 75 y.o. female with medical history significant for end-stage renal disease declining hemodialysis, type 2 diabetes mellitus, hypertension, paroxysmal atrial fibrillation chronically anticoagulated on Eliquis, chronic diastolic heart failure, recurrent ischemic CVAs who presented for worsening L. Leg weakness was diagnosed with subacute CVA and admitted to the hospital.   Subjective:   Patient in bed in no distress, mildly confused, denies any headache, again refused HD, renal MD bedside as well.   Assessment  & Plan :     Acute L Leg weakness with H/O recurrent CVAs - H/O Afib on Eliquis - MRI limited and inconclusive ? Extension of recent CVA, Neuro following, on Eliquis + Statin, CVA workup per Neuro, will keep SBP > 150 for now, ? Comfort care due to ESRD.  2. ESRD - patient wishes no HD, Pall Care following, DW daughter 04/17/21 - comfort Rx likely Hospice. Family now here, Pall care to meet on 04/18/21 and decide future plan.  3. Chr. Diastolic CHF EF [id number] - currently compensated on beta-blocker along with diuretic, no ACE/ARB or Entresto due to renal failure.  4. Paroxysmal atrial fibrillation - [place] VASC 2 > 5 - Eliquis and B Blocker.  5. HTN - permissive HTN, goal 150 for now.  6. Obesity - BMI 32 - supportive Rx.   7. Metabolic Encephalopathy - likely due to #1 and #2  - supportive  Rx.        Condition - Extremely Guarded  Family Communication  :  daughter Joellyn Rued [phone] 04/17/21, 04/18/21  Code Status :  DNR  Consults  :  Renal, Pall Care, Neuro  PUD Prophylaxis :     Procedures  :     MRI -  1.  Severely motion degraded, incomplete examination. 2. Unchanged extent of subacute right ACA infarct. 3. No new infarct identified. 4. Chronic small vessel ischemia with multiple chronic lacunar infarcts .      Disposition Plan  :    Status is: Inpt   Dispo: The patient is from: Home              Anticipated d/c is to: SNF              Patient currently is not medically stable to d/c.   Difficult to place patient No   DVT Prophylaxis  :    SCDs Start: 04/16/21 0055 apixaban (ELIQUIS) tablet 5 mg   Lab Results  Component Value Date   PLT 165 04/16/2021    Diet :  Diet Order             Diet regular Room service appropriate? Yes; Fluid consistency: Thin  Diet effective now                    Inpatient Medications  Scheduled Meds:    stroke: mapping our early stages of recovery book   Does not apply Once   apixaban  5 mg Oral BID   atorvastatin  40 mg Oral Daily   furosemide  80 mg Oral Daily   hydrALAZINE  25 mg Oral TID   insulin aspart  0-6 Units Subcutaneous TID WC   metoprolol succinate  100 mg Oral Daily   pantoprazole  40 mg Oral Daily   sevelamer carbonate  800 mg Oral TID WC   sodium bicarbonate  1,300 mg Oral BID   Continuous Infusions: PRN Meds:.acetaminophen **OR** acetaminophen, cloNIDine, hydrALAZINE  Antibiotics  :    Anti-infectives (From admission, onward)    None        Time Spent in minutes  30   Lala Lund M.D on 04/18/2021 at 11:03 AM  To page go to [email or web address]   [facility] -  Office  [phone]  See all Orders from today for further details    Objective:   Vitals:   04/17/21 2245 04/17/21 2359 04/18/21 0404 04/18/21 0817  BP: (!) 180/140 (!) 176/82 (!) 174/89 (!)  141/106  Pulse: 71 77 79 75  Resp:  '16 17 18  '$ Temp:  98.5 F (36.9 C) 98.1 F (36.7 C) 98.9 F (37.2 C)  TempSrc:  Oral Oral   SpO2:  98% 97% 98%  Weight:   77 kg   Height:        Wt Readings from Last 3 Encounters:  04/18/21 77 kg  04/13/21 82.5 kg     Intake/Output Summary (Last 24 hours) at 04/18/2021 1103 Last data filed at 04/18/2021 0410 Gross per 24 hour  Intake --  Output 600 ml  Net -600 ml     Physical Exam  Awake but confused, reduced L.sided movement some L. Neglect, unable to answer questions or follow commands reliably  [place].AT,PERRAL Supple Neck, No JVD,   Symmetrical Chest wall movement, Good air movement bilaterally, CTAB RRR,No Gallops,Rubs or new Murmurs, No Parasternal Heave +ve B.Sounds, Abd Soft, No tenderness,   No Cyanosis, Clubbing or edema,      RN pressure injury documentation: Pressure Injury 04/11/21 Sacrum Stage 2 -  Partial thickness loss of dermis presenting as a shallow open injury with a red, pink wound bed without slough. Two Small red open areas noted to middle sacrum and left side of butt (Active)  04/11/21 0900  Location: Sacrum  Location Orientation:   Staging: Stage 2 -  Partial thickness loss of dermis presenting as a shallow open injury with a red, pink wound bed without slough.  Wound Description (Comments): Two Small red open areas noted to middle sacrum and left side of butt  Present on Admission: Yes     Data Review:    CBC Recent Labs  Lab 04/12/21 0630 04/13/21 0536 04/16/21 0215  WBC 4.8 5.3 5.6  HGB 7.9* 8.8* 8.3*  HCT 28.1* 30.7* 28.8*  PLT 130* 105* 165  MCV 91.5 90.8 90.6  MCH 25.7* 26.0 26.1  MCHC 28.1* 28.7* 28.8*  RDW 17.7* 17.8* 18.1*    Recent Labs  Lab 04/12/21 0630 04/13/21 0536 04/16/21 0215  NA 137 137 136  K 4.2 4.4 4.8  CL 110 109 105  CO2 21* 21* 22  GLUCOSE 90 108* 89  BUN 53* 54* 53*  CREATININE 6.75* 6.74* 7.00*  CALCIUM 7.7* 7.8* 7.8*  AST  --   --  23  ALT  --   --  15   ALKPHOS  --   --  63  BILITOT  --   --  0.7  ALBUMIN 2.2* 2.3* 2.0*  MG  --   --  1.8    ------------------------------------------------------------------------------------------------------------------ No results for input(s): CHOL, HDL, LDLCALC, TRIG, CHOLHDL, LDLDIRECT in the last 72 hours.  Lab Results  Component Value Date   HGBA1C 5.9 (H) 04/08/2021   ------------------------------------------------------------------------------------------------------------------ No results for input(s): TSH, T4TOTAL, T3FREE, THYROIDAB in the last 72 hours.  Invalid input(s): FREET3  Cardiac Enzymes No results for input(s): CKMB, TROPONINI, MYOGLOBIN in the last 168 hours.  Invalid input(s): CK ------------------------------------------------------------------------------------------------------------------    Component Value Date/Time   BNP 1,127.0 (H) 04/11/2021 0702     Radiology Reports DG Chest 1 View  Result Date: 04/09/2021 CLINICAL DATA:  RIGHT pleural effusion post thoracentesis EXAM: CHEST  1 VIEW COMPARISON:  Exam at 1341 hours compared to 0451 hours FINDINGS: Resolution of RIGHT pleural effusion. Minimal residual bibasilar atelectasis. No pneumothorax. Enlargement of cardiac silhouette persists. Atherosclerotic calcification aorta. IMPRESSION: No pneumothorax following RIGHT thoracentesis. Minimal bibasilar atelectasis. Electronically Signed   By: Lavonia Dana M.D.   On: 04/09/2021 14:37   DG Chest 1 View  Result Date: 04/08/2021 CLINICAL DATA:  [place] this morning. Low O2 sats. Confusion for 2 days. EXAM: CHEST  1 VIEW COMPARISON:  None. FINDINGS: Heart is enlarged but also accentuated by technique. Moderate RIGHT pleural effusion is noted, with opacification of the RIGHT lung base which obscures the RIGHT hemidiaphragm. Pulmonary vascular congestion is present. LEFT lung is otherwise clear. No pneumothorax. No acute displaced fractures. IMPRESSION: Cardiomegaly and pulmonary  vascular congestion. RIGHT pleural effusion and RIGHT LOWER lobe opacity consistent with atelectasis or infiltrate. Electronically Signed   By: Nolon Nations M.D.   On: 04/08/2021 12:30   CT Head Wo Contrast  Result Date: 04/08/2021 CLINICAL DATA:  Mental status change. EXAM: CT HEAD WITHOUT CONTRAST TECHNIQUE: Contiguous axial images were obtained from the base of the skull through the vertex without intravenous contrast. COMPARISON:  None. FINDINGS: Brain: Ventricles and sulci are prominent compatible with atrophy. Periventricular and subcortical white matter hypodensities compatible with chronic microvascular ischemic changes. Low-attenuation throughout the majority of the left cerebellar hemisphere. 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 No evidence for acute intracranial hemorrhage or significant mass effect. Vascular: Unremarkable. Skull: Intact. Sinuses/Orbits: Paranasal sinuses are well aerated. Mastoid air cells are unremarkable. Other: None. IMPRESSION:  Low-attenuation within the majority of the left cerebellar hemisphere may represent subacute to chronic infarct, in the absence of priors. Consider further evaluation with MRI as clinically indicated. Atrophy and chronic microvascular ischemic changes. Electronically Signed   By: Lovey Newcomer M.D.   On: 04/08/2021 12:13   MR ANGIO HEAD WO CONTRAST  Result Date: 04/09/2021 CLINICAL DATA:  Altered mental status, does not communicate EXAM: MRI HEAD WITHOUT CONTRAST MRA HEAD WITHOUT CONTRAST MRA NECK WITHOUT CONTRAST TECHNIQUE: Multiplanar, multiecho pulse sequences of the brain and surrounding structures were obtained without intravenous contrast. Angiographic images of the Circle of Willis were obtained using MRA technique without intravenous contrast. Angiographic images of the neck were obtained using MRA technique without intravenous contrast. Carotid stenosis measurements (when applicable) are obtained utilizing NASCET  criteria, using the distal internal carotid diameter as the denominator. COMPARISON:  Same-day noncontrast CT head FINDINGS: Image quality is significantly degraded by motion artifact. MRI HEAD FINDINGS Brain: There is diffusion restriction in the right ACA distribution involving the right aspect of the genu of the corpus callosum and right parasagittal frontal lobe. The FLAIR sequences markedly motion degraded, but there appears to be associated FLAIR signal abnormality. Findings consistent with evolving early subacute infarct. Susceptibility artifact along the falx on the T2* sequence is likely due to bulky dural calcifications seen on the same-day head CT. There is no convincing evidence of hemorrhagic transformation. There is a large remote infarct in the left cerebellar hemisphere and a small remote infarct in the right cerebellar hemisphere. There is a background of mild-to-moderate global parenchymal volume loss with enlargement of the ventricular system and extra-axial CSF spaces. Additional foci of T2/FLAIR signal abnormality in the subcortical and periventricular white matter likely reflects sequela of chronic white matter microangiopathy. Vascular: The major intracranial flow voids are present. Skull and upper cervical spine: Normal marrow signal. Sinuses/Orbits: The imaged paranasal sinuses are clear. Bilateral lens implants are in place. The globes and orbits are otherwise unremarkable. Other: There is a left mastoid effusion. MRA HEAD FINDINGS Evaluation of the intracranial vasculature is markedly degraded by motion artifact. Anterior circulation: The right intracranial ICA appears patent. Evaluation of the left intracranial ICA is markedly degraded, but appears to be patent. The bilateral M1 segments are patent. The distal branches appear overall patent, but are not well evaluated. The proximal anterior cerebral arteries are patent. The distal anterior cerebral arteries are not well evaluated.  Anterior circulation aneurysm can not be excluded. Posterior circulation: The V4 segments of the vertebral arteries are patent. The basilar artery is patent. The proximal PCAs are patent. The distal branches are not well evaluated. Posterior circulation aneurysms can not be excluded. Anatomic variants: The posterior communicating arteries are not definitively identified. MRA NECK FINDINGS Evaluation of the vasculature of the neck is markedly degraded by motion artifact. Aortic arch: Not evaluated. Right carotid System: Time-of-flight MRA demonstrates antegrade flow in the right carotid system. Stenosis or dissection can not be evaluated. Left carotid System: Time-of-flight MRA demonstrates antegrade flow in the left carotid system. Stenosis or dissection can not be evaluated. Vertebral arteries: Time-of-flight MRA demonstrates antegrade flow in the vertebral arteries. Stenosis or dissection can not be evaluated. IMPRESSION: 1. Markedly motion degraded study with essentially nondiagnostic MRA of the head and neck. 2. Early subacute infarct in the right ACA distribution without definite evidence of hemorrhagic transformation. 3. The intracranial vasculature is markedly suboptimally evaluated. The proximal vessels appear grossly patent. The distal vasculature is not evaluated. 4. Time-of-flight MRA demonstrates  antegrade flow in the carotid systems and vertebral arteries. Stenoses or dissection can not be evaluated. 5. 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 6. Consider CTA of the head/neck for evaluation of the vasculature as indicated given reduced susceptibility to motion artifact. Electronically Signed   By: Valetta Mole M.D.   On: 04/09/2021 11:08   MR ANGIO NECK WO CONTRAST  Result Date: 04/09/2021 CLINICAL DATA:  Altered mental status, does not communicate EXAM: MRI HEAD WITHOUT CONTRAST MRA HEAD WITHOUT CONTRAST MRA NECK WITHOUT CONTRAST TECHNIQUE: Multiplanar,  multiecho pulse sequences of the brain and surrounding structures were obtained without intravenous contrast. Angiographic images of the Circle of Willis were obtained using MRA technique without intravenous contrast. Angiographic images of the neck were obtained using MRA technique without intravenous contrast. Carotid stenosis measurements (when applicable) are obtained utilizing NASCET criteria, using the distal internal carotid diameter as the denominator. COMPARISON:  Same-day noncontrast CT head FINDINGS: Image quality is significantly degraded by motion artifact. MRI HEAD FINDINGS Brain: There is diffusion restriction in the right ACA distribution involving the right aspect of the genu of the corpus callosum and right parasagittal frontal lobe. The FLAIR sequences markedly motion degraded, but there appears to be associated FLAIR signal abnormality. Findings consistent with evolving early subacute infarct. Susceptibility artifact along the falx on the T2* sequence is likely due to bulky dural calcifications seen on the same-day head CT. There is no convincing evidence of hemorrhagic transformation. There is a large remote infarct in the left cerebellar hemisphere and a small remote infarct in the right cerebellar hemisphere. There is a background of mild-to-moderate global parenchymal volume loss with enlargement of the ventricular system and extra-axial CSF spaces. Additional foci of T2/FLAIR signal abnormality in the subcortical and periventricular white matter likely reflects sequela of chronic white matter microangiopathy. Vascular: The major intracranial flow voids are present. Skull and upper cervical spine: Normal marrow signal. Sinuses/Orbits: The imaged paranasal sinuses are clear. Bilateral lens implants are in place. The globes and orbits are otherwise unremarkable. Other: There is a left mastoid effusion. MRA HEAD FINDINGS Evaluation of the intracranial vasculature is markedly degraded by motion  artifact. Anterior circulation: The right intracranial ICA appears patent. Evaluation of the left intracranial ICA is markedly degraded, but appears to be patent. The bilateral M1 segments are patent. The distal branches appear overall patent, but are not well evaluated. The proximal anterior cerebral arteries are patent. The distal anterior cerebral arteries are not well evaluated. Anterior circulation aneurysm can not be excluded. Posterior circulation: The V4 segments of the vertebral arteries are patent. The basilar artery is patent. The proximal PCAs are patent. The distal branches are not well evaluated. Posterior circulation aneurysms can not be excluded. Anatomic variants: The posterior communicating arteries are not definitively identified. MRA NECK FINDINGS Evaluation of the vasculature of the neck is markedly degraded by motion artifact. Aortic arch: Not evaluated. Right carotid System: Time-of-flight MRA demonstrates antegrade flow in the right carotid system. Stenosis or dissection can not be evaluated. Left carotid System: Time-of-flight MRA demonstrates antegrade flow in the left carotid system. Stenosis or dissection can not be evaluated. Vertebral arteries: Time-of-flight MRA demonstrates antegrade flow in the vertebral arteries. Stenosis or dissection can not be evaluated. IMPRESSION: 1. Markedly motion degraded study with essentially nondiagnostic MRA of the head and neck. 2. Early subacute infarct in the right ACA distribution without definite evidence of hemorrhagic transformation. 3. The intracranial vasculature is markedly suboptimally evaluated. The proximal vessels  appear grossly patent. The distal vasculature is not evaluated. 4. Time-of-flight MRA demonstrates antegrade flow in the carotid systems and vertebral arteries. Stenoses or dissection can not be evaluated. 5. 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 6. Consider CTA of the head/neck for  evaluation of the vasculature as indicated given reduced susceptibility to motion artifact. Electronically Signed   By: Valetta Mole M.D.   On: 04/09/2021 11:08   MR BRAIN WO CONTRAST  Result Date: 04/16/2021 CLINICAL DATA:  Neuro deficit, acute, stroke suspected. EXAM: MRI HEAD WITHOUT CONTRAST TECHNIQUE: Multiplanar, multiecho pulse sequences of the brain and surrounding structures were obtained without intravenous contrast. COMPARISON:  04/09/2021 FINDINGS: The study is severely motion degraded and was terminated prior to completion. Axial and coronal diffusion, sagittal T1, axial T2, and axial FLAIR sequences were obtained. Brain: A large subacute right ACA infarct is unchanged in extent from the prior MRI. Associated cytotoxic edema has increased without significant mass effect. No new infarct is identified. Encephalomalacia is again noted anteriorly in the left frontal lobe and in the left greater than right cerebellar hemispheres. Chronic small vessel ischemia is again noted in the cerebral white matter bilaterally with chronic lacunar infarcts in the basal ganglia, left thalamus, and pons. No gross intracranial hemorrhage, midline shift, or extra-axial fluid collection is identified. There is mild cerebral atrophy. Vascular: Major intracranial arterial flow voids are grossly preserved. Skull and upper cervical spine: No gross skull lesion. Sinuses/Orbits: Bilateral cataract extraction. Mild mucosal thickening in the paranasal sinuses. Left mastoid effusion. Other: None. IMPRESSION: 1. Severely motion degraded, incomplete examination. 2. Unchanged extent of subacute right ACA infarct. 3. No new infarct identified. 4. Chronic small vessel ischemia with multiple chronic lacunar infarcts as above. Electronically Signed   By: Logan Bores M.D.   On: 04/16/2021 06:52   MR BRAIN WO CONTRAST  Result Date: 04/09/2021 CLINICAL DATA:  Altered mental status, does not communicate EXAM: MRI HEAD WITHOUT CONTRAST  MRA HEAD WITHOUT CONTRAST MRA NECK WITHOUT CONTRAST TECHNIQUE: Multiplanar, multiecho pulse sequences of the brain and surrounding structures were obtained without intravenous contrast. Angiographic images of the Circle of Willis were obtained using MRA technique without intravenous contrast. Angiographic images of the neck were obtained using MRA technique without intravenous contrast. Carotid stenosis measurements (when applicable) are obtained utilizing NASCET criteria, using the distal internal carotid diameter as the denominator. COMPARISON:  Same-day noncontrast CT head FINDINGS: Image quality is significantly degraded by motion artifact. MRI HEAD FINDINGS Brain: There is diffusion restriction in the right ACA distribution involving the right aspect of the genu of the corpus callosum and right parasagittal frontal lobe. The FLAIR sequences markedly motion degraded, but there appears to be associated FLAIR signal abnormality. Findings consistent with evolving early subacute infarct. Susceptibility artifact along the falx on the T2* sequence is likely due to bulky dural calcifications seen on the same-day head CT. There is no convincing evidence of hemorrhagic transformation. There is a large remote infarct in the left cerebellar hemisphere and a small remote infarct in the right cerebellar hemisphere. There is a background of mild-to-moderate global parenchymal volume loss with enlargement of the ventricular system and extra-axial CSF spaces. Additional foci of T2/FLAIR signal abnormality in the subcortical and periventricular white matter likely reflects sequela of chronic white matter microangiopathy. Vascular: The major intracranial flow voids are present. Skull and upper cervical spine: Normal marrow signal. Sinuses/Orbits: The imaged paranasal sinuses are clear. Bilateral lens implants are in place. The globes and  orbits are otherwise unremarkable. Other: There is a left mastoid effusion. MRA HEAD FINDINGS  Evaluation of the intracranial vasculature is markedly degraded by motion artifact. Anterior circulation: The right intracranial ICA appears patent. Evaluation of the left intracranial ICA is markedly degraded, but appears to be patent. The bilateral M1 segments are patent. The distal branches appear overall patent, but are not well evaluated. The proximal anterior cerebral arteries are patent. The distal anterior cerebral arteries are not well evaluated. Anterior circulation aneurysm can not be excluded. Posterior circulation: The V4 segments of the vertebral arteries are patent. The basilar artery is patent. The proximal PCAs are patent. The distal branches are not well evaluated. Posterior circulation aneurysms can not be excluded. Anatomic variants: The posterior communicating arteries are not definitively identified. MRA NECK FINDINGS Evaluation of the vasculature of the neck is markedly degraded by motion artifact. Aortic arch: Not evaluated. Right carotid System: Time-of-flight MRA demonstrates antegrade flow in the right carotid system. Stenosis or dissection can not be evaluated. Left carotid System: Time-of-flight MRA demonstrates antegrade flow in the left carotid system. Stenosis or dissection can not be evaluated. Vertebral arteries: Time-of-flight MRA demonstrates antegrade flow in the vertebral arteries. Stenosis or dissection can not be evaluated. IMPRESSION: 1. Markedly motion degraded study with essentially nondiagnostic MRA of the head and neck. 2. Early subacute infarct in the right ACA distribution without definite evidence of hemorrhagic transformation. 3. The intracranial vasculature is markedly suboptimally evaluated. The proximal vessels appear grossly patent. The distal vasculature is not evaluated. 4. Time-of-flight MRA demonstrates antegrade flow in the carotid systems and vertebral arteries. Stenoses or dissection can not be evaluated. 5. Remote infarcts in the bilateral cerebellar  hemispheres, left worse than right. 6. Consider CTA of the head/neck for evaluation of the vasculature as indicated given reduced susceptibility to motion artifact. Electronically Signed   By: Valetta Mole M.D.   On: 04/09/2021 11:08   US RENAL  Result Date: 04/09/2021 CLINICAL DATA:  Pulmonary edema, pleural effusion, diabetes mellitus, hypertension, altered mental EXAM: RENAL / URINARY TRACT ULTRASOUND COMPLETE COMPARISON:  None FINDINGS: Right Kidney: Renal measurements: 10.3 x 5.6 x 5.5 cm = volume: 164 mL. Normal cortical thickness. Significantly increased cortical echogenicity. No hydronephrosis or shadowing calcification. Multiple RIGHT renal cysts, largest 2.2 cm and 2.3 cm in greatest sizes. Left Kidney: No LEFT kidney visualized, question obscured, ectopic, or surgically versus developmentally absent. Bladder: Appears normal for degree of bladder distention. Other: N/A IMPRESSION: Nonvisualization of LEFT kidney. Medical renal disease changes RIGHT kidney with multiple renal cysts. Electronically Signed   By: Lavonia Dana M.D.   On: 04/09/2021 14:39   DG CHEST PORT 1 VIEW  Result Date: 04/16/2021 CLINICAL DATA:  In stage renal disease. EXAM: PORTABLE CHEST 1 VIEW COMPARISON:  04/09/2021 FINDINGS: The cardiac silhouette remains enlarged. Aortic atherosclerosis is noted. New opacity in the right mid and lower lung is compatible with reaccumulation of a moderate-sized pleural effusion with atelectasis or consolidation. The left lung is grossly clear. No pneumothorax is identified. No acute osseous abnormality is seen. IMPRESSION: Reaccumulation of moderate right pleural effusion with right basilar atelectasis or consolidation. Electronically Signed   By: Logan Bores M.D.   On: 04/16/2021 05:39   DG Chest Port 1 View  Result Date: 04/09/2021 CLINICAL DATA:  75 year old female with history of right pleural effusion and pulmonary edema. EXAM: PORTABLE CHEST 1 VIEW COMPARISON:  Chest x-ray  04/08/2021. FINDINGS: Opacity in the base of the right hemithorax compatible with atelectasis and/or  consolidation, with superimposed moderate to large right pleural effusion. Left lung appears clear. No left pleural effusion. Cephalization of the pulmonary vasculature. Heart size is mildly enlarged. Atherosclerotic calcifications in the thoracic aorta. IMPRESSION: 1. Persistent moderate to large right pleural effusion with atelectasis and/or consolidation in the right lung base. 2. Mild cardiomegaly. 3. Aortic atherosclerosis. 1. Electronically Signed   By: Vinnie Langton M.D.   On: 04/09/2021 05:22   ECHOCARDIOGRAM COMPLETE  Result Date: 04/09/2021    ECHOCARDIOGRAM REPORT   Patient Name:   LITIA TADESSE Date of Exam: 04/09/2021 Medical Rec #:  [id number]         Height:       63.5 in Accession #:    [id number]        Weight:       192.7 lb Date of Birth:  [birth year]         BSA:          1.914 m Patient Age:    52 years          BP:           167/86 mmHg Patient Gender: F                 HR:           82 bpm. Exam Location:  [facility] Procedure: 2D Echo, Cardiac Doppler and Color Doppler Indications:    Congestive Heart Failure  History:        Patient has no prior history of Echocardiogram examinations.                 CHF, CAD, Arrythmias:Atrial Fibrillation; Risk Factors:Diabetes                 and Hypertension.  Sonographer:    Wenda Low Referring Phys: [place]  1. Left ventricular ejection fraction, by estimation, is 60 to 65%. The left ventricle has normal function. The left ventricle has no regional wall motion abnormalities. There is severe concentric left ventricular hypertrophy. Left ventricular diastolic  parameters are consistent with Grade II diastolic dysfunction (pseudonormalization). There is the interventricular septum is flattened in systole and diastole, consistent with right ventricular pressure and volume overload.  2. Right ventricular systolic  function is mildly reduced. The right ventricular size is mildly enlarged. Mildly increased right ventricular wall thickness. There is severely elevated pulmonary artery systolic pressure. The estimated right ventricular systolic pressure is [id number] mmHg.  3. Left atrial size was moderately dilated.  4. The mitral valve is grossly normal. Mild mitral valve regurgitation.  5. Tricuspid valve regurgitation is moderate.  6. The aortic valve is tricuspid. Aortic valve regurgitation is not visualized. Mild aortic valve sclerosis is present, with no evidence of aortic valve stenosis. Aortic valve mean gradient measures 5.0 mmHg.  7. The inferior vena cava is dilated in size with <50% respiratory variability, suggesting right atrial pressure of 15 mmHg. Comparison(s): No prior Echocardiogram. FINDINGS  Left Ventricle: Left ventricular ejection fraction, by estimation, is 60 to 65%. The left ventricle has normal function. The left ventricle has no regional wall motion abnormalities. The left ventricular internal cavity size was normal in size. There is  severe concentric left ventricular hypertrophy. The interventricular septum is flattened in systole and diastole, consistent with right ventricular pressure and volume overload. Left ventricular diastolic parameters are consistent with Grade II diastolic dysfunction (pseudonormalization). Right Ventricle: The right ventricular size is mildly enlarged. Mildly increased right ventricular wall thickness.  Right ventricular systolic function is mildly reduced. There is severely elevated pulmonary artery systolic pressure. The tricuspid regurgitant velocity is 3.46 m/s, and with an assumed right atrial pressure of 15 mmHg, the estimated right ventricular systolic pressure is [id number] mmHg. Left Atrium: Left atrial size was moderately dilated. Right Atrium: Right atrial size was normal in size. Pericardium: There is no evidence of pericardial effusion. Mitral Valve: The mitral valve is  grossly normal. Mild mitral valve regurgitation. MV peak gradient, 4.0 mmHg. The mean mitral valve gradient is 1.0 mmHg. Tricuspid Valve: The tricuspid valve is grossly normal. Tricuspid valve regurgitation is moderate. Aortic Valve: The aortic valve is tricuspid. There is mild aortic valve annular calcification. Aortic valve regurgitation is not visualized. Mild aortic valve sclerosis is present, with no evidence of aortic valve stenosis. Aortic valve mean gradient measures 5.0 mmHg. Aortic valve peak gradient measures 9.1 mmHg. Aortic valve area, by VTI measures 1.69 cm. Pulmonic Valve: The pulmonic valve was grossly normal. Pulmonic valve regurgitation is trivial. Aorta: The aortic root is normal in size and structure. Venous: The inferior vena cava is dilated in size with less than 50% respiratory variability, suggesting right atrial pressure of 15 mmHg. IAS/Shunts: No atrial level shunt detected by color flow Doppler.  LEFT VENTRICLE PLAX 2D LVIDd:         4.30 cm  Diastology LVIDs:         2.90 cm  LV e' medial:    3.78 cm/s LV PW:         1.90 cm  LV E/e' medial:  22.3 LV IVS:        1.40 cm  LV e' lateral:   10.10 cm/s LVOT diam:     2.00 cm  LV E/e' lateral: 8.4 LV SV:         63 LV SV Index:   33 LVOT Area:     3.14 cm  RIGHT VENTRICLE RV Basal diam:  4.10 cm RV Mid diam:    3.20 cm LEFT ATRIUM              Index       RIGHT ATRIUM           Index LA diam:        4.30 cm  2.25 cm/m  RA Area:     18.40 cm LA Vol (A2C):   106.0 ml 55.37 ml/m RA Volume:   46.20 ml  24.13 ml/m LA Vol (A4C):   77.1 ml  40.27 ml/m LA Biplane Vol: 90.7 ml  47.38 ml/m  AORTIC VALVE                    PULMONIC VALVE AV Area (Vmax):    1.83 cm     PV Vmax:       0.77 m/s AV Area (Vmean):   1.80 cm     PV Peak grad:  2.4 mmHg AV Area (VTI):     1.69 cm AV Vmax:           151.00 cm/s AV Vmean:          102.000 cm/s AV VTI:            0.372 m AV Peak Grad:      9.1 mmHg AV Mean Grad:      5.0 mmHg LVOT Vmax:         88.10  cm/s LVOT Vmean:        58.500 cm/s LVOT VTI:  0.200 m LVOT/AV VTI ratio: 0.54  AORTA Ao Root diam: 3.00 cm MITRAL VALVE               TRICUSPID VALVE MV Area (PHT): 2.68 cm    TR Peak grad:   47.9 mmHg MV Area VTI:   1.79 cm    TR Vmax:        346.00 cm/s MV Peak grad:  4.0 mmHg MV Mean grad:  1.0 mmHg    SHUNTS MV Vmax:       1.00 m/s    Systemic VTI:  0.20 m MV Vmean:      51.8 cm/s   Systemic Diam: 2.00 cm MV Decel Time: 283 msec MV E velocity: 84.40 cm/s MV A velocity: 78.40 cm/s MV E/A ratio:  1.08 Rozann Lesches MD Electronically signed by Rozann Lesches MD Signature Date/Time: 04/09/2021/4:11:51 PM    Final    CT HEAD CODE STROKE WO CONTRAST  Result Date: 04/09/2021 CLINICAL DATA:  Code stroke.  Altered mental status EXAM: CT HEAD WITHOUT CONTRAST TECHNIQUE: Contiguous axial images were obtained from the base of the skull through the vertex without intravenous contrast. COMPARISON:  Head CT from yesterday FINDINGS: Brain: New cytotoxic edema in the parasagittal right frontal lobe, ACA distribution. Chronic small vessel ischemia in the hemispheric white matter with chronic basal ganglia lacunar infarcts. Chronic left larger than right cerebellar infarcts. Encephalomalacia in the left frontal lobe at the anterior cranial fossa and anterior parasagittal region, likely also post ischemic. Vascular: No hyperdense vessel or unexpected calcification. Skull: Normal. Negative for fracture or focal lesion. Sinuses/Orbits: Bilateral cataract resection Other: These results were communicated to Dr. Wynetta Emery at 9:40 am on 04/09/2021 by text page via the [facility] messaging system. IMPRESSION: 1. Acute cortical infarct is newly seen in the parasagittal right frontal lobe, ACA branch distribution. 2. Chronic ischemic injury as described, including remote left ACA territory infarcts. Electronically Signed   By: Jorje Guild M.D.   On: 04/09/2021 09:42   US THORACENTESIS ASP PLEURAL SPACE W/IMG GUIDE  Result  Date: 04/09/2021 INDICATION: RIGHT pleural effusion EXAM: ULTRASOUND GUIDED DIAGNOSTIC AND THERAPEUTIC RIGHT THORACENTESIS MEDICATIONS: None. COMPLICATIONS: None immediate. PROCEDURE: An ultrasound guided thoracentesis was thoroughly discussed with the patient and questions answered. The benefits, risks, alternatives and complications were also discussed. The patient understands and wishes to proceed with the procedure. Written consent was obtained. Ultrasound was performed to localize and mark an adequate pocket of fluid in the RIGHT chest. The area was then prepped and draped in the normal sterile fashion. 1% Lidocaine was used for local anesthesia. Under ultrasound guidance a 5 [place] Yueh catheter was introduced. Thoracentesis was performed. The catheter was removed and a dressing applied. FINDINGS: A total of approximately 1 L of yellow RIGHT pleural fluid was removed. Samples were sent to the laboratory as requested by the clinical team. IMPRESSION: Successful ultrasound guided RIGHT thoracentesis yielding 1 L of pleural fluid. Electronically Signed   By: Lavonia Dana M.D.   On: 04/09/2021 14:23

## 2021-04-18 NOTE — Progress Notes (Signed)
Physical Therapy Treatment Patient Details Name: Tiffany Valencia MRN: [id number] DOB: [birth year] Today's Date: 04/18/2021   History of Present Illness 75 yo female presenting to ED with R sided weakness on 10/10. MRI negative for acute change. Pt with recent admission at [facility] (discharge on 10/7 to SNF) with new R ACA and L hemiparesis. PMH including end-stage renal disease declining hemodialysis, type 2 diabetes mellitus, hypertension, paroxysmal atrial fibrillation chronically anticoagulated on Eliquis, chronic diastolic heart failure, and recurrent ischemic CVAs.    PT Comments    Patient with some progress over last session since more participative with supine to sit and sitting balance though focused on itching on her back rather than on sitting balance activity.  She was able to initiate sit to stand with +2 lifting help.  Feel she remains appropriate for SNF rehab till able to transfer to car for home with family and Hospice care in [place].     Recommendations for follow up therapy are one component of a multi-disciplinary discharge planning process, led by the attending physician.  Recommendations may be updated based on patient status, additional functional criteria and insurance authorization.  Follow Up Recommendations  SNF     Equipment Recommendations  Wheelchair (measurements PT);Hospital bed;Wheelchair cushion (measurements PT)    Recommendations for Other Services       Precautions / Restrictions Precautions Precautions: Fall Precaution Comments: L hemiplegia     Mobility  Bed Mobility Overal bed mobility: Needs Assistance Bed Mobility: Supine to Sit;Sit to Supine Rolling: Max assist   Supine to sit: HOB elevated;Mod assist;+2 for physical assistance Sit to supine: +2 for physical assistance;Max assist   General bed mobility comments: to sitting assist for L LE off bed pt moving R and pt pulling up to sit with some support from back, assist to scoot to  EOB; to supine assist for both legs and to lower trunk; rolling to place bed pan as pt requesting for BM; pt attempting to help with R UE when rolling L; assist to scoot up    Transfers Overall transfer level: Needs assistance   Transfers: Sit to/from Stand Sit to Stand: Mod assist;+2 physical assistance         General transfer comment: up to attempt standing from EOB with lifting pad under pt, not well tolerated but up on her feet momentarily prior to return to sit, did not attempt again due to pain in L knee  Ambulation/Gait                 Stairs             Wheelchair Mobility    Modified Rankin (Stroke Patients Only) Modified Rankin (Stroke Patients Only) Pre-Morbid Rankin Score: Severe disability Modified Rankin: Severe disability     Balance Overall balance assessment: Needs assistance Sitting-balance support: Feet supported Sitting balance-Leahy Scale: Poor Sitting balance - Comments: min to mod A for balance unless pt holding rail with R UE then sits with close S; c/o back itching so focused on that rather than working on balance, PT applied lotion due to dry skin and evidence of scratching with scars on her back Postural control: Left lateral lean;Posterior lean                                  Cognition Arousal/Alertness: Awake/alert Behavior During Therapy: WFL for tasks assessed/performed Overall Cognitive Status: No family/caregiver present to determine baseline cognitive functioning  General Comments: oriented to place and situation and following commands well      Exercises General Exercises - Lower Extremity Ankle Circles/Pumps: AAROM;Both;5 reps;Supine    General Comments        Pertinent Vitals/Pain Pain Assessment: Faces Faces Pain Scale: Hurts even more Pain Location: L UE and buttocks Pain Descriptors / Indicators: Sore;Aching Pain Intervention(s): Monitored during  session;Repositioned;Limited activity within patient's tolerance    Home Living                      Prior Function            PT Goals (current goals can now be found in the care plan section) Progress towards PT goals: Progressing toward goals    Frequency    Min 2X/week      PT Plan Current plan remains appropriate    Co-evaluation              AM-PAC PT "6 Clicks" Mobility   Outcome Measure  Help needed turning from your back to your side while in a flat bed without using bedrails?: A Lot Help needed moving from lying on your back to sitting on the side of a flat bed without using bedrails?: Total Help needed moving to and from a bed to a chair (including a wheelchair)?: Total Help needed standing up from a chair using your arms (e.g., wheelchair or bedside chair)?: Total Help needed to walk in hospital room?: Total Help needed climbing 3-5 steps with a railing? : Total 6 Click Score: 7    End of Session   Activity Tolerance: Patient limited by pain;Patient limited by fatigue Patient left: in bed;with call bell/phone within reach;with bed alarm set   PT Visit Diagnosis: Other abnormalities of gait and mobility (R26.89);Muscle weakness (generalized) (M62.81);Other symptoms and signs involving the nervous system (R29.898);Hemiplegia and hemiparesis Hemiplegia - Right/Left: Left Hemiplegia - dominant/non-dominant: Non-dominant Hemiplegia - caused by: Cerebral infarction     Time: 1335-1400 PT Time Calculation (min) (ACUTE ONLY): 25 min  Charges:  $Therapeutic Activity: 23-37 mins                     Magda Kiel, PT Acute Rehabilitation Services [id number] Office:[phone] 04/18/2021    Tiffany Valencia 04/18/2021, 4:45 PM

## 2021-04-18 NOTE — TOC Initial Note (Signed)
Transition of Care [facility]) - Initial/Assessment Note    Patient Details  Name: Tiffany Valencia MRN: [id number] Date of Birth: 01-22-1946  Transition of Care [facility]) CM/SW Contact:    Geralynn Ochs, LCSW Phone Number: 04/18/2021, 3:58 PM  Clinical Narrative:      CSW alerted by palliative NP that patient's family would like to work towards getting her home with hospice with the daughter Tiffany Valencia in [place]. Palliative NP sent CSW name and contact of hospice agency in [place], and CSW reached out to send referral and confirm receipt. CSW alerted by MD that patient will be ready for discharge tomorrow, unable to go to [place] with hospice tomorrow. CSW reached out to [place] at [facility] to explain situation and confirm that patient can return to [facility] tomorrow. CSW spoke with patient's sister and children to explain situation, discuss how patient can return to SNF to continue to work on her rehab and get strong enough to ride in a car to [place] while the family works towards coordinating hospice equipment and transport for the patient to get to [place]. Family in agreement. CSW to follow.             Expected Discharge Plan: Skilled Nursing Facility Barriers to Discharge: Continued Medical Work up   Patient Goals and CMS Choice Patient states their goals for this hospitalization and ongoing recovery are:: return home CMS Medicare.gov Compare Post Acute Care list provided to:: Patient Represenative (must comment) Choice offered to / list presented to : Adult Children  Expected Discharge Plan and Services Expected Discharge Plan: [place] Acute Care Choice: [place] arrangements for the past 2 months: Single Family Home                                      Prior Living Arrangements/Services Living arrangements for the past 2 months: Single Family Home Lives with:: Relatives Patient language and need for interpreter  reviewed:: No Do you feel safe going back to the place where you live?: Yes      Need for Family Participation in Patient Care: Yes (Comment) Care giver support system in place?: Yes (comment)   Criminal Activity/Legal Involvement Pertinent to Current Situation/Hospitalization: No - Comment as needed  Activities of Daily Living      Permission Sought/Granted Permission sought to share information with : Facility Sport and exercise psychologist, Family Supports Permission granted to share information with : Yes, Verbal Permission Granted  Share Information with NAME: Armandina Stammer  Permission granted to share info w AGENCY: [place] granted to share info w Relationship: Children, Sister     Emotional Assessment   Attitude/Demeanor/Rapport: Unable to Assess Affect (typically observed): Unable to Assess Orientation: : Oriented to Self, Oriented to Place, Oriented to Situation, Oriented to  Time Alcohol / Substance Use: Not Applicable Psych Involvement: No (comment)  Admission diagnosis:  ESRD (end stage renal disease) ([place]) [N18.6] Dysarthria [R47.1] Patient Active Problem List   Diagnosis Date Noted   Stroke ([place]) 04/16/2021   Hypertension    ESRD (end stage renal disease) (H[facility])    Chronic diastolic CHF (congestive heart failure) (H[facility])    Goals of care, counseling/discussion    Pressure injury of skin 04/12/2021   AKI (acute kidney injury) ([place])    Cerebrovascular accident (CVA) due to embolism of right anterior cerebral artery ([place])  Acute heart failure ([place]) 04/08/2021   Gait instability 04/08/2021   DM (diabetes mellitus), type 2 with peripheral vascular complications ([place]) [id number]   Benign hypertension with CKD (chronic kidney disease) stage IV ([place]) 04/08/2021   Acquired thrombophilia ([place]) 04/08/2021   Chronic anticoagulation 04/08/2021   AF (paroxysmal atrial fibrillation) ([place]) 04/08/2021   Hypertensive urgency  04/08/2021   Anemia in chronic kidney disease (CKD)    Pleural effusion on right    Altered mental state    Volume overload    Metabolic acidosis    Elevated brain natriuretic peptide (BNP) level    Hypoalbuminemia    Dysarthria    Coronary artery disease s/p STENT    Acute respiratory failure with hypoxia ([place])    PCP:  Pcp, No Pharmacy:   [facility] [address] [place] [address] Phone: [phone] Fax: [phone]     Social Determinants of Health (SDOH) Interventions    Readmission Risk Interventions No flowsheet data found.

## 2021-04-18 NOTE — Progress Notes (Addendum)
Patient ID: Tiffany Valencia, female   DOB: 03/17/1946, 75 y.o.   MRN: [id number] [facility] Progress Note   Assessment/ Plan:   1.  Chronic kidney disease stage V: With GFR around 96m/minutes and azotemia that is likely contributing to her mental status.  She has made the decision not to pursue any form of renal replacement therapy which I confirmed via phone call with her daughter SSol Blazing(who supports this).  I recommend transitioning to comfort care measures/hospice care as I anticipate mortality within 6 months. 2.  Acute left lower extremity weakness: With history of recurrent CVAs including a recent one last week.  With underlying atrial fibrillation on anticoagulation with Eliquis.  Continue risk factor modification per primary service/neurology-recent repeat MRI of brain limited by motion artifact. 3.  History of chronic diastolic heart failure: Appears to be compensated on the current dose of diuresis, continue sodium restriction/fluid restriction with ongoing management including beta-blocker. 4.  Paroxysmal atrial fibrillation: Currently rate controlled and appears to be in sinus rhythm, on Eliquis. 5.  Anemia of chronic disease: Without overt blood loss, continue to trend hemoglobin and hematocrit to decide on need for ESA.  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  Renal service will sign off at this time and remain available for questions or concerns.  Subjective:   Requesting assistance with cleaning up herself and breakfast.  Overnight reported to be refusing medications and calling 911 for assistance.   Objective:   BP (!) 141/106 (BP Location: Right Arm)   Pulse 75   Temp 98.9 F (37.2 C)   Resp 18   Ht '5\' 3"'$  (1.6 m)   Wt 77 kg   SpO2 98%   BMI 30.07 kg/m   Intake/Output Summary (Last 24 hours) at 04/18/2021 0840 Last data filed at 04/18/2021 0410 Gross per 24 hour  Intake --  Output 950 ml  Net -950 ml   Weight change: -5.5  kg  Physical Exam: Gen: Appears comfortable resting in bed, not in distress but appears confused CVS: Pulse regular rhythm, normal rate, S1 and S2 normal Resp: Clear to auscultation bilaterally, no rales/rhonchi Abd: Soft, obese, nontender Ext: Trace lower extremity edema  Imaging: No results found.  Labs: BMET Recent Labs  Lab 04/12/21 0630 04/13/21 0536 04/16/21 0215  NA 137 137 136  K 4.2 4.4 4.8  CL 110 109 105  CO2 21* 21* 22  GLUCOSE 90 108* 89  BUN 53* 54* 53*  CREATININE 6.75* 6.74* 7.00*  CALCIUM 7.7* 7.8* 7.8*  PHOS 5.6* 6.0* 6.0*   CBC Recent Labs  Lab 04/12/21 0630 04/13/21 0536 04/16/21 0215  WBC 4.8 5.3 5.6  HGB 7.9* 8.8* 8.3*  HCT 28.1* 30.7* 28.8*  MCV 91.5 90.8 90.6  PLT 130* 105* 165    Medications:      stroke: mapping our early stages of recovery book   Does not apply Once   apixaban  5 mg Oral BID   atorvastatin  40 mg Oral Daily   furosemide  80 mg Oral Daily   hydrALAZINE  25 mg Oral TID   insulin aspart  0-6 Units Subcutaneous TID WC   metoprolol succinate  100 mg Oral Daily   pantoprazole  40 mg Oral Daily   sevelamer carbonate  800 mg Oral TID WC   sodium bicarbonate  1,300 mg Oral BID      JElmarie Shiley MD 04/18/2021, 8:40 AM

## 2021-04-18 NOTE — Plan of Care (Signed)
  Problem: Education: Goal: Knowledge of disease or condition will improve Outcome: Progressing Goal: Knowledge of secondary prevention will improve Outcome: Progressing   Problem: Self-Care: Goal: Verbalization of feelings and concerns over difficulty with self-care will improve Outcome: Progressing

## 2021-04-18 NOTE — Plan of Care (Signed)
  Problem: Education: Goal: Knowledge of General Education information will improve Description: Including pain rating scale, medication(s)/side effects and non-pharmacologic comfort measures Outcome: Progressing   Problem: Clinical Measurements: Goal: Ability to maintain clinical measurements within normal limits will improve Outcome: Progressing Goal: Will remain free from infection Outcome: Progressing Goal: Diagnostic test results will improve Outcome: Progressing Goal: Respiratory complications will improve Outcome: Progressing Goal: Cardiovascular complication will be avoided Outcome: Progressing   Problem: Elimination: Goal: Will not experience complications related to bowel motility Outcome: Progressing Goal: Will not experience complications related to urinary retention Outcome: Progressing   Problem: Safety: Goal: Ability to remain free from injury will improve Outcome: Progressing   Problem: Education: Goal: Knowledge of disease or condition will improve Outcome: Progressing Goal: Knowledge of secondary prevention will improve Outcome: Progressing Goal: Knowledge of patient specific risk factors addressed and post discharge goals established will improve Outcome: Progressing Goal: Individualized Educational Video(s) Outcome: Progressing   Problem: Self-Care: Goal: Verbalization of feelings and concerns over difficulty with self-care will improve Outcome: Progressing Goal: Ability to communicate needs accurately will improve Outcome: Progressing   Problem: Nutrition: Goal: Risk of aspiration will decrease Outcome: Progressing

## 2021-04-19 LAB — GLUCOSE, CAPILLARY: Glucose-Capillary: 97 mg/dL (ref 70–99)

## 2021-04-19 MED ORDER — APIXABAN 5 MG PO TABS: 5.0000 mg | ORAL_TABLET | Freq: Two times a day (BID) | ORAL | Status: AC

## 2021-04-19 MED ORDER — TRAMADOL HCL 50 MG PO TABS: 25.0000 mg | ORAL_TABLET | Freq: Two times a day (BID) | ORAL | 0 refills | Status: AC | PRN

## 2021-04-19 MED ORDER — CLONIDINE HCL 0.1 MG PO TABS: 0.1000 mg | ORAL_TABLET | Freq: Three times a day (TID) | ORAL | 0 refills | Status: AC

## 2021-04-19 NOTE — Plan of Care (Signed)
  Problem: Education: Goal: Knowledge of General Education information will improve Description: Including pain rating scale, medication(s)/side effects and non-pharmacologic comfort measures Outcome: Progressing   Problem: Health Behavior/Discharge Planning: Goal: Ability to manage health-related needs will improve Outcome: Progressing   Problem: Clinical Measurements: Goal: Ability to maintain clinical measurements within normal limits will improve Outcome: Progressing Goal: Will remain free from infection Outcome: Progressing Goal: Diagnostic test results will improve Outcome: Progressing Goal: Respiratory complications will improve Outcome: Progressing Goal: Cardiovascular complication will be avoided Outcome: Progressing   Problem: Activity: Goal: Risk for activity intolerance will decrease Outcome: Progressing   Problem: Nutrition: Goal: Adequate nutrition will be maintained Outcome: Progressing   Problem: Coping: Goal: Level of anxiety will decrease Outcome: Progressing   Problem: Elimination: Goal: Will not experience complications related to bowel motility Outcome: Progressing Goal: Will not experience complications related to urinary retention Outcome: Progressing   Problem: Pain Managment: Goal: General experience of comfort will improve Outcome: Progressing   Problem: Safety: Goal: Ability to remain free from injury will improve Outcome: Progressing   Problem: Skin Integrity: Goal: Risk for impaired skin integrity will decrease Outcome: Progressing   Problem: Education: Goal: Knowledge of disease or condition will improve Outcome: Progressing Goal: Knowledge of secondary prevention will improve Outcome: Progressing Goal: Knowledge of patient specific risk factors addressed and post discharge goals established will improve Outcome: Progressing Goal: Individualized Educational Video(s) Outcome: Progressing   Problem: Self-Care: Goal:  Verbalization of feelings and concerns over difficulty with self-care will improve Outcome: Progressing Goal: Ability to communicate needs accurately will improve Outcome: Progressing   Problem: Nutrition: Goal: Risk of aspiration will decrease Outcome: Progressing

## 2021-04-19 NOTE — Progress Notes (Signed)
Pt called out to staff walking by that she was bleeding. I go into the pt's room and find her pilling a scab off of her right thigh. I cleansed the site and added mepilex foam.

## 2021-04-19 NOTE — Progress Notes (Signed)
Called report to [facility]

## 2021-04-19 NOTE — Plan of Care (Signed)
  Problem: Education: Goal: Knowledge of disease or condition will improve Outcome: Progressing Goal: Knowledge of secondary prevention will improve Outcome: Progressing   Problem: Self-Care: Goal: Verbalization of feelings and concerns over difficulty with self-care will improve Outcome: Progressing   Problem: Nutrition: Goal: Risk of aspiration will decrease Outcome: Progressing

## 2021-04-19 NOTE — Discharge Instructions (Signed)
Disposition.  Residential hospice °Condition.  Guarded °CODE STATUS.  DNR °Activity.  With assistance as tolerated, full fall precautions. °Diet.  Soft with feeding assistance and aspiration precautions. °Goal of care.  Comfort. ° °

## 2021-04-19 NOTE — TOC Transition Note (Signed)
Transition of Care [facility]) - CM/SW Discharge Note   Patient Details  Name: Tiffany Valencia MRN: [id number] Date of Birth: February 16, 1946  [facility] CM/SW Contact:  Geralynn Ochs, LCSW Phone Number: 04/19/2021, 11:41 AM   Clinical Narrative:   CSW spoke with [place] to discuss patient's case, and they will reach out to family to establish hospice care.   CSW spoke with [name] [facility] did not answer, unable to leave voicemail) to update on discharge today to SNF, and be aware to look out for hospice to call to establish care. [name] appreciative of update.  Nurse to call report to [phone], Room 21A.    Final next level of care: Skilled Nursing Facility Barriers to Discharge: Barriers Resolved   Patient Goals and CMS Choice Patient states their goals for this hospitalization and ongoing recovery are:: return home CMS Medicare.gov Compare Post Acute Care list provided to:: Patient Represenative (must comment) Choice offered to / list presented to : Adult Children  Discharge Placement              Patient [facility] bed at: [facility] Patient to be transferred to facility by: [place] Name of family member notified: [name] Patient and family notified of of transfer: 04/19/21  Discharge Plan and Services     Post Acute Care Choice: [place]                               Social Determinants of Health (SDOH) Interventions     Readmission Risk Interventions No flowsheet data found.

## 2021-04-19 NOTE — Discharge Summary (Signed)
Tiffany Valencia [id number] DOB: Apr 05, 1946 DOA: 04/16/2021  PCP: Pcp, No  Admit date: 04/16/2021  Discharge date: 04/19/2021  Admitted From: Home  Disposition:  Home   Recommendations for Outpatient Follow-up:   Follow up with PCP in 1-2 weeks  PCP Please obtain BMP/CBC, 2 view CXR in 1week,  (see Discharge instructions)   PCP Please follow up on the following pending results:     Home Health: None Equipment/Devices: None  Consultations: Pall.Care, Nephrology Discharge Condition: Fair   CODE STATUS: DNR   Diet Recommendation: Soft    Chief Complaint  Patient presents with   Cerebrovascular Accident     Brief history of present illness from the day of admission and additional interim summary    Tiffany Valencia is a 75 y.o. female with medical history significant for end-stage renal disease declining hemodialysis, type 2 diabetes mellitus, hypertension, paroxysmal atrial fibrillation chronically anticoagulated on Eliquis, chronic diastolic heart failure, recurrent ischemic CVAs who presented for worsening L. Leg weakness was diagnosed with subacute CVA and admitted to the hospital.                                                                 Hospital Course   Acute L Leg weakness with H/O recurrent CVAs - H/O Afib on Eliquis - MRI limited and inconclusive ? Extension of recent CVA, Neuro following, on Eliquis + Statin continue, she is now on Comfort care due to ESRD and refusal to do HD, Med Rx to be continued for now, plan to go to SNF with Pall.Care and eventually to [place] with family in [place]. Seen by Neuro.   2. ESRD - patient wishes no HD, Pall Care following, DW daughter 04/17/21 - Med Rx to be continued for now, plan to go to SNF with Pall.Care and eventually to [place] with  family in [place]..   3. Chr. Diastolic CHF EF [id number] - currently compensated on beta-blocker along with diuretic, no ACE/ARB or Entresto due to renal failure.   4. Paroxysmal atrial fibrillation - [place] VASC 2 > 5 - Eliquis and B Blocker.   5. HTN - on below Meds.   6. Obesity - BMI 32 - supportive Rx.   7. Metabolic Encephalopathy - likely due to #1 and #2  stable, - supportive Rx.  8.DM2 - ISS - CBGs QAC-HS   Discharge diagnosis     Principal Problem:   Stroke [facility]) Active Problems:   DM (diabetes mellitus), type 2 with peripheral vascular complications (HCC)   Dysarthria   AF (paroxysmal atrial fibrillation) (HCC)   Hypertension   ESRD (end stage renal disease) (HCC)   Chronic diastolic CHF (congestive heart failure) ([place])   Goals of care, counseling/discussion    Discharge instructions    Discharge Instructions  Discharge instructions   Complete by: As directed    Disposition.  Residential hospice Condition.  Guarded CODE STATUS.  DNR Activity.  With assistance as tolerated, full fall precautions. Diet.  Soft with feeding assistance and aspiration precautions. Goal of care.  Comfort.  Check CBGs QAC-HS   Increase activity slowly   Complete by: As directed    No wound care   Complete by: As directed        Discharge Medications   Allergies as of 04/19/2021   No Known Allergies      Medication List     STOP taking these medications    isosorbide mononitrate 30 MG 24 hr tablet Commonly known as: IMDUR   Lantus SoloStar 100 UNIT/ML Solostar Pen Generic drug: insulin glargine   mirtazapine 7.5 MG tablet Commonly known as: REMERON   oxyCODONE 5 MG immediate release tablet Commonly known as: Oxy IR/ROXICODONE       TAKE these medications    acetaminophen 325 MG tablet Commonly known as: TYLENOL Take 2 tablets (650 mg total) by mouth every 6 (six) hours as needed for mild pain (or Fever >/= 101).   albuterol 108 (90 Base) MCG/ACT  inhaler Commonly known as: VENTOLIN HFA Inhale 1 puff into the lungs daily. X 14 days   amLODipine 10 MG tablet Commonly known as: NORVASC Take 10 mg by mouth daily.   apixaban 5 MG Tabs tablet Commonly known as: ELIQUIS Take 1 tablet (5 mg total) by mouth 2 (two) times daily. What changed:  medication strength how much to take   atorvastatin 80 MG tablet Commonly known as: LIPITOR Take 40 mg by mouth daily.   cloNIDine 0.1 MG tablet Commonly known as: CATAPRES Take 1 tablet (0.1 mg total) by mouth 3 (three) times daily. What changed:  medication strength how much to take when to take this   furosemide 80 MG tablet Commonly known as: LASIX Take 1 tablet (80 mg total) by mouth daily.   Gerhardt's butt cream Crea Apply 1 application topically 3 (three) times daily. Apply to buttocks/perineum   hydrALAZINE 50 MG tablet Commonly known as: APRESOLINE Take 1 tablet (50 mg total) by mouth 3 (three) times daily. What changed: how much to take   insulin aspart 100 UNIT/ML FlexPen Commonly known as: NOVOLOG Inject 0-10 Units into the skin 3 (three) times daily with meals. insulin aspart (novoLOG) injection 0-10 Units 0-10 Units Subcutaneous, 3 times daily with meals CBG < 70: Implement Hypoglycemia Standing Orders and refer to Hypoglycemia Standing Orders sidebar report  CBG 70 - 120: 0 unit CBG 121 - 150: 0 unit  CBG 151 - 200: 1 unit CBG 201 - 250: 2 units CBG 251 - 300: 4 units CBG 301 - 350: 6 units  CBG 351 - 400: 8 units  CBG > 400: 10 units   isosorbide dinitrate 30 MG tablet Commonly known as: ISORDIL Take 30 mg by mouth daily.   metoprolol succinate 100 MG 24 hr tablet Commonly known as: TOPROL-XL Take 1 tablet (100 mg total) by mouth daily.   nystatin powder Commonly known as: MYCOSTATIN/NYSTOP Apply 1 application topically 2 (two) times daily. Apply under right breast bid for rash   ondansetron 4 MG tablet Commonly known as: ZOFRAN Take 1 tablet (4 mg total)  by mouth every 6 (six) hours as needed for nausea.   pantoprazole 40 MG tablet Commonly known as: PROTONIX Take 1 tablet (40 mg total) by mouth daily.   senna-docusate 8.6-50 MG  tablet Commonly known as: Senokot-S Take 2 tablets by mouth at bedtime.   sevelamer carbonate 800 MG tablet Commonly known as: RENVELA Take 1 tablet (800 mg total) by mouth 3 (three) times daily with meals.   sodium bicarbonate 650 MG tablet Take 2 tablets (1,300 mg total) by mouth 2 (two) times daily.   traMADol 50 MG tablet Commonly known as: ULTRAM Take 0.5 tablets (25 mg total) by mouth every 12 (twelve) hours as needed for moderate pain.   Vitamin D (Ergocalciferol) 1.25 MG (50000 UNIT) Caps capsule Commonly known as: DRISDOL Take 50,000 Units by mouth every 7 (seven) days. Monday         Contact information for after-discharge care     [facility] .   Service: Skilled Nursing Contact information: [place] [phone]                     Major procedures and Radiology Reports - PLEASE review detailed and final reports thoroughly  -       DG Chest 1 View  Result Date: 04/09/2021 CLINICAL DATA:  RIGHT pleural effusion post thoracentesis EXAM: CHEST  1 VIEW COMPARISON:  Exam at 1341 hours compared to 0451 hours FINDINGS: Resolution of RIGHT pleural effusion. Minimal residual bibasilar atelectasis. No pneumothorax. Enlargement of cardiac silhouette persists. Atherosclerotic calcification aorta. IMPRESSION: No pneumothorax following RIGHT thoracentesis. Minimal bibasilar atelectasis. Electronically Signed   By: Lavonia Dana M.D.   On: 04/09/2021 14:37   DG Chest 1 View  Result Date: 04/08/2021 CLINICAL DATA:  [place] this morning. Low O2 sats. Confusion for 2 days. EXAM: CHEST  1 VIEW COMPARISON:  None. FINDINGS: Heart is enlarged but also accentuated by technique. Moderate RIGHT pleural effusion is noted, with  opacification of the RIGHT lung base which obscures the RIGHT hemidiaphragm. Pulmonary vascular congestion is present. LEFT lung is otherwise clear. No pneumothorax. No acute displaced fractures. IMPRESSION: Cardiomegaly and pulmonary vascular congestion. RIGHT pleural effusion and RIGHT LOWER lobe opacity consistent with atelectasis or infiltrate. Electronically Signed   By: Nolon Nations M.D.   On: 04/08/2021 12:30   CT Head Wo Contrast  Result Date: 04/08/2021 CLINICAL DATA:  Mental status change. EXAM: CT HEAD WITHOUT CONTRAST TECHNIQUE: Contiguous axial images were obtained from the base of the skull through the vertex without intravenous contrast. COMPARISON:  None. FINDINGS: Brain: Ventricles and sulci are prominent compatible with atrophy. Periventricular and subcortical white matter hypodensities compatible with chronic microvascular ischemic changes. Low-attenuation throughout the majority of the left cerebellar hemisphere. 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 No evidence for acute intracranial hemorrhage or significant mass effect. Vascular: Unremarkable. Skull: Intact. Sinuses/Orbits: Paranasal sinuses are well aerated. Mastoid air cells are unremarkable. Other: None. IMPRESSION: Low-attenuation within the majority of the left cerebellar hemisphere may represent subacute to chronic infarct, in the absence of priors. Consider further evaluation with MRI as clinically indicated. Atrophy and chronic microvascular ischemic changes. Electronically Signed   By: Lovey Newcomer M.D.   On: 04/08/2021 12:13   MR ANGIO HEAD WO CONTRAST  Result Date: 04/09/2021 CLINICAL DATA:  Altered mental status, does not communicate EXAM: MRI HEAD WITHOUT CONTRAST MRA HEAD WITHOUT CONTRAST MRA NECK WITHOUT CONTRAST TECHNIQUE: Multiplanar, multiecho pulse sequences of the brain and surrounding structures were obtained without intravenous contrast. Angiographic images of the Circle of Willis were  obtained using MRA technique without intravenous contrast. Angiographic images of the neck  were obtained using MRA technique without intravenous contrast. Carotid stenosis measurements (when applicable) are obtained utilizing NASCET criteria, using the distal internal carotid diameter as the denominator. COMPARISON:  Same-day noncontrast CT head FINDINGS: Image quality is significantly degraded by motion artifact. MRI HEAD FINDINGS Brain: There is diffusion restriction in the right ACA distribution involving the right aspect of the genu of the corpus callosum and right parasagittal frontal lobe. The FLAIR sequences markedly motion degraded, but there appears to be associated FLAIR signal abnormality. Findings consistent with evolving early subacute infarct. Susceptibility artifact along the falx on the T2* sequence is likely due to bulky dural calcifications seen on the same-day head CT. There is no convincing evidence of hemorrhagic transformation. There is a large remote infarct in the left cerebellar hemisphere and a small remote infarct in the right cerebellar hemisphere. There is a background of mild-to-moderate global parenchymal volume loss with enlargement of the ventricular system and extra-axial CSF spaces. Additional foci of T2/FLAIR signal abnormality in the subcortical and periventricular white matter likely reflects sequela of chronic white matter microangiopathy. Vascular: The major intracranial flow voids are present. Skull and upper cervical spine: Normal marrow signal. Sinuses/Orbits: The imaged paranasal sinuses are clear. Bilateral lens implants are in place. The globes and orbits are otherwise unremarkable. Other: There is a left mastoid effusion. MRA HEAD FINDINGS Evaluation of the intracranial vasculature is markedly degraded by motion artifact. Anterior circulation: The right intracranial ICA appears patent. Evaluation of the left intracranial ICA is markedly degraded, but appears to be  patent. The bilateral M1 segments are patent. The distal branches appear overall patent, but are not well evaluated. The proximal anterior cerebral arteries are patent. The distal anterior cerebral arteries are not well evaluated. Anterior circulation aneurysm can not be excluded. Posterior circulation: The V4 segments of the vertebral arteries are patent. The basilar artery is patent. The proximal PCAs are patent. The distal branches are not well evaluated. Posterior circulation aneurysms can not be excluded. Anatomic variants: The posterior communicating arteries are not definitively identified. MRA NECK FINDINGS Evaluation of the vasculature of the neck is markedly degraded by motion artifact. Aortic arch: Not evaluated. Right carotid System: Time-of-flight MRA demonstrates antegrade flow in the right carotid system. Stenosis or dissection can not be evaluated. Left carotid System: Time-of-flight MRA demonstrates antegrade flow in the left carotid system. Stenosis or dissection can not be evaluated. Vertebral arteries: Time-of-flight MRA demonstrates antegrade flow in the vertebral arteries. Stenosis or dissection can not be evaluated. IMPRESSION: 1. Markedly motion degraded study with essentially nondiagnostic MRA of the head and neck. 2. Early subacute infarct in the right ACA distribution without definite evidence of hemorrhagic transformation. 3. The intracranial vasculature is markedly suboptimally evaluated. The proximal vessels appear grossly patent. The distal vasculature is not evaluated. 4. Time-of-flight MRA demonstrates antegrade flow in the carotid systems and vertebral arteries. Stenoses or dissection can not be evaluated. 5. 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 6. Consider CTA of the head/neck for evaluation of the vasculature as indicated given reduced susceptibility to motion artifact. Electronically Signed   By: Valetta Mole M.D.   On: 04/09/2021 11:08    MR ANGIO NECK WO CONTRAST  Result Date: 04/09/2021 CLINICAL DATA:  Altered mental status, does not communicate EXAM: MRI HEAD WITHOUT CONTRAST MRA HEAD WITHOUT CONTRAST MRA NECK WITHOUT CONTRAST TECHNIQUE: Multiplanar, multiecho pulse sequences of the brain and surrounding structures were obtained without intravenous contrast. Angiographic images of the Circle of Willis  were obtained using MRA technique without intravenous contrast. Angiographic images of the neck were obtained using MRA technique without intravenous contrast. Carotid stenosis measurements (when applicable) are obtained utilizing NASCET criteria, using the distal internal carotid diameter as the denominator. COMPARISON:  Same-day noncontrast CT head FINDINGS: Image quality is significantly degraded by motion artifact. MRI HEAD FINDINGS Brain: There is diffusion restriction in the right ACA distribution involving the right aspect of the genu of the corpus callosum and right parasagittal frontal lobe. The FLAIR sequences markedly motion degraded, but there appears to be associated FLAIR signal abnormality. Findings consistent with evolving early subacute infarct. Susceptibility artifact along the falx on the T2* sequence is likely due to bulky dural calcifications seen on the same-day head CT. There is no convincing evidence of hemorrhagic transformation. There is a large remote infarct in the left cerebellar hemisphere and a small remote infarct in the right cerebellar hemisphere. There is a background of mild-to-moderate global parenchymal volume loss with enlargement of the ventricular system and extra-axial CSF spaces. Additional foci of T2/FLAIR signal abnormality in the subcortical and periventricular white matter likely reflects sequela of chronic white matter microangiopathy. Vascular: The major intracranial flow voids are present. Skull and upper cervical spine: Normal marrow signal. Sinuses/Orbits: The imaged paranasal sinuses are  clear. Bilateral lens implants are in place. The globes and orbits are otherwise unremarkable. Other: There is a left mastoid effusion. MRA HEAD FINDINGS Evaluation of the intracranial vasculature is markedly degraded by motion artifact. Anterior circulation: The right intracranial ICA appears patent. Evaluation of the left intracranial ICA is markedly degraded, but appears to be patent. The bilateral M1 segments are patent. The distal branches appear overall patent, but are not well evaluated. The proximal anterior cerebral arteries are patent. The distal anterior cerebral arteries are not well evaluated. Anterior circulation aneurysm can not be excluded. Posterior circulation: The V4 segments of the vertebral arteries are patent. The basilar artery is patent. The proximal PCAs are patent. The distal branches are not well evaluated. Posterior circulation aneurysms can not be excluded. Anatomic variants: The posterior communicating arteries are not definitively identified. MRA NECK FINDINGS Evaluation of the vasculature of the neck is markedly degraded by motion artifact. Aortic arch: Not evaluated. Right carotid System: Time-of-flight MRA demonstrates antegrade flow in the right carotid system. Stenosis or dissection can not be evaluated. Left carotid System: Time-of-flight MRA demonstrates antegrade flow in the left carotid system. Stenosis or dissection can not be evaluated. Vertebral arteries: Time-of-flight MRA demonstrates antegrade flow in the vertebral arteries. Stenosis or dissection can not be evaluated. IMPRESSION: 1. Markedly motion degraded study with essentially nondiagnostic MRA of the head and neck. 2. Early subacute infarct in the right ACA distribution without definite evidence of hemorrhagic transformation. 3. The intracranial vasculature is markedly suboptimally evaluated. The proximal vessels appear grossly patent. The distal vasculature is not evaluated. 4. Time-of-flight MRA demonstrates  antegrade flow in the carotid systems and vertebral arteries. Stenoses or dissection can not be evaluated. 5. 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 6. Consider CTA of the head/neck for evaluation of the vasculature as indicated given reduced susceptibility to motion artifact. Electronically Signed   By: Valetta Mole M.D.   On: 04/09/2021 11:08   MR BRAIN WO CONTRAST  Result Date: 04/16/2021 CLINICAL DATA:  Neuro deficit, acute, stroke suspected. EXAM: MRI HEAD WITHOUT CONTRAST TECHNIQUE: Multiplanar, multiecho pulse sequences of the brain and surrounding structures were obtained without intravenous contrast. COMPARISON:  04/09/2021 FINDINGS:  The study is severely motion degraded and was terminated prior to completion. Axial and coronal diffusion, sagittal T1, axial T2, and axial FLAIR sequences were obtained. Brain: A large subacute right ACA infarct is unchanged in extent from the prior MRI. Associated cytotoxic edema has increased without significant mass effect. No new infarct is identified. Encephalomalacia is again noted anteriorly in the left frontal lobe and in the left greater than right cerebellar hemispheres. Chronic small vessel ischemia is again noted in the cerebral white matter bilaterally with chronic lacunar infarcts in the basal ganglia, left thalamus, and pons. No gross intracranial hemorrhage, midline shift, or extra-axial fluid collection is identified. There is mild cerebral atrophy. Vascular: Major intracranial arterial flow voids are grossly preserved. Skull and upper cervical spine: No gross skull lesion. Sinuses/Orbits: Bilateral cataract extraction. Mild mucosal thickening in the paranasal sinuses. Left mastoid effusion. Other: None. IMPRESSION: 1. Severely motion degraded, incomplete examination. 2. Unchanged extent of subacute right ACA infarct. 3. No new infarct identified. 4. Chronic small vessel ischemia with multiple chronic lacunar  infarcts as above. Electronically Signed   By: Logan Bores M.D.   On: 04/16/2021 06:52   MR BRAIN WO CONTRAST  Result Date: 04/09/2021 CLINICAL DATA:  Altered mental status, does not communicate EXAM: MRI HEAD WITHOUT CONTRAST MRA HEAD WITHOUT CONTRAST MRA NECK WITHOUT CONTRAST TECHNIQUE: Multiplanar, multiecho pulse sequences of the brain and surrounding structures were obtained without intravenous contrast. Angiographic images of the Circle of Willis were obtained using MRA technique without intravenous contrast. Angiographic images of the neck were obtained using MRA technique without intravenous contrast. Carotid stenosis measurements (when applicable) are obtained utilizing NASCET criteria, using the distal internal carotid diameter as the denominator. COMPARISON:  Same-day noncontrast CT head FINDINGS: Image quality is significantly degraded by motion artifact. MRI HEAD FINDINGS Brain: There is diffusion restriction in the right ACA distribution involving the right aspect of the genu of the corpus callosum and right parasagittal frontal lobe. The FLAIR sequences markedly motion degraded, but there appears to be associated FLAIR signal abnormality. Findings consistent with evolving early subacute infarct. Susceptibility artifact along the falx on the T2* sequence is likely due to bulky dural calcifications seen on the same-day head CT. There is no convincing evidence of hemorrhagic transformation. There is a large remote infarct in the left cerebellar hemisphere and a small remote infarct in the right cerebellar hemisphere. There is a background of mild-to-moderate global parenchymal volume loss with enlargement of the ventricular system and extra-axial CSF spaces. Additional foci of T2/FLAIR signal abnormality in the subcortical and periventricular white matter likely reflects sequela of chronic white matter microangiopathy. Vascular: The major intracranial flow voids are present. Skull and upper cervical  spine: Normal marrow signal. Sinuses/Orbits: The imaged paranasal sinuses are clear. Bilateral lens implants are in place. The globes and orbits are otherwise unremarkable. Other: There is a left mastoid effusion. MRA HEAD FINDINGS Evaluation of the intracranial vasculature is markedly degraded by motion artifact. Anterior circulation: The right intracranial ICA appears patent. Evaluation of the left intracranial ICA is markedly degraded, but appears to be patent. The bilateral M1 segments are patent. The distal branches appear overall patent, but are not well evaluated. The proximal anterior cerebral arteries are patent. The distal anterior cerebral arteries are not well evaluated. Anterior circulation aneurysm can not be excluded. Posterior circulation: The V4 segments of the vertebral arteries are patent. The basilar artery is patent. The proximal PCAs are patent. The distal branches are not well evaluated. Posterior circulation  aneurysms can not be excluded. Anatomic variants: The posterior communicating arteries are not definitively identified. MRA NECK FINDINGS Evaluation of the vasculature of the neck is markedly degraded by motion artifact. Aortic arch: Not evaluated. Right carotid System: Time-of-flight MRA demonstrates antegrade flow in the right carotid system. Stenosis or dissection can not be evaluated. Left carotid System: Time-of-flight MRA demonstrates antegrade flow in the left carotid system. Stenosis or dissection can not be evaluated. Vertebral arteries: Time-of-flight MRA demonstrates antegrade flow in the vertebral arteries. Stenosis or dissection can not be evaluated. IMPRESSION: 1. Markedly motion degraded study with essentially nondiagnostic MRA of the head and neck. 2. Early subacute infarct in the right ACA distribution without definite evidence of hemorrhagic transformation. 3. The intracranial vasculature is markedly suboptimally evaluated. The proximal vessels appear grossly patent. The  distal vasculature is not evaluated. 4. Time-of-flight MRA demonstrates antegrade flow in the carotid systems and vertebral arteries. Stenoses or dissection can not be evaluated. 5. 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 6. Consider CTA of the head/neck for evaluation of the vasculature as indicated given reduced susceptibility to motion artifact. Electronically Signed   By: Valetta Mole M.D.   On: 04/09/2021 11:08   US RENAL  Result Date: 04/09/2021 CLINICAL DATA:  Pulmonary edema, pleural effusion, diabetes mellitus, hypertension, altered mental EXAM: RENAL / URINARY TRACT ULTRASOUND COMPLETE COMPARISON:  None FINDINGS: Right Kidney: Renal measurements: 10.3 x 5.6 x 5.5 cm = volume: 164 mL. Normal cortical thickness. Significantly increased cortical echogenicity. No hydronephrosis or shadowing calcification. Multiple RIGHT renal cysts, largest 2.2 cm and 2.3 cm in greatest sizes. Left Kidney: No LEFT kidney visualized, question obscured, ectopic, or surgically versus developmentally absent. Bladder: Appears normal for degree of bladder distention. Other: N/A IMPRESSION: Nonvisualization of LEFT kidney. Medical renal disease changes RIGHT kidney with multiple renal cysts. Electronically Signed   By: Lavonia Dana M.D.   On: 04/09/2021 14:39   DG CHEST PORT 1 VIEW  Result Date: 04/16/2021 CLINICAL DATA:  In stage renal disease. EXAM: PORTABLE CHEST 1 VIEW COMPARISON:  04/09/2021 FINDINGS: The cardiac silhouette remains enlarged. Aortic atherosclerosis is noted. New opacity in the right mid and lower lung is compatible with reaccumulation of a moderate-sized pleural effusion with atelectasis or consolidation. The left lung is grossly clear. No pneumothorax is identified. No acute osseous abnormality is seen. IMPRESSION: Reaccumulation of moderate right pleural effusion with right basilar atelectasis or consolidation. Electronically Signed   By: Logan Bores M.D.   On:  04/16/2021 05:39   DG Chest Port 1 View  Result Date: 04/09/2021 CLINICAL DATA:  75 year old female with history of right pleural effusion and pulmonary edema. EXAM: PORTABLE CHEST 1 VIEW COMPARISON:  Chest x-ray 04/08/2021. FINDINGS: Opacity in the base of the right hemithorax compatible with atelectasis and/or consolidation, with superimposed moderate to large right pleural effusion. Left lung appears clear. No left pleural effusion. Cephalization of the pulmonary vasculature. Heart size is mildly enlarged. Atherosclerotic calcifications in the thoracic aorta. IMPRESSION: 1. Persistent moderate to large right pleural effusion with atelectasis and/or consolidation in the right lung base. 2. Mild cardiomegaly. 3. Aortic atherosclerosis. 1. Electronically Signed   By: Vinnie Langton M.D.   On: 04/09/2021 05:22   ECHOCARDIOGRAM COMPLETE  Result Date: 04/09/2021    ECHOCARDIOGRAM REPORT   Patient Name:   KHALEYA AWADALLA Date of Exam: 04/09/2021 Medical Rec #:  [id number]         Height:       63.5  in Accession #:    [id number]        Weight:       192.7 lb Date of Birth:  06-19-1946         BSA:          1.914 m Patient Age:    70 years          BP:           167/86 mmHg Patient Gender: F                 HR:           82 bpm. Exam Location:  [facility] Procedure: 2D Echo, Cardiac Doppler and Color Doppler Indications:    Congestive Heart Failure  History:        Patient has no prior history of Echocardiogram examinations.                 CHF, CAD, Arrythmias:Atrial Fibrillation; Risk Factors:Diabetes                 and Hypertension.  Sonographer:    Wenda Low Referring Phys: [place]  1. Left ventricular ejection fraction, by estimation, is 60 to 65%. The left ventricle has normal function. The left ventricle has no regional wall motion abnormalities. There is severe concentric left ventricular hypertrophy. Left ventricular diastolic  parameters are consistent with Grade II  diastolic dysfunction (pseudonormalization). There is the interventricular septum is flattened in systole and diastole, consistent with right ventricular pressure and volume overload.  2. Right ventricular systolic function is mildly reduced. The right ventricular size is mildly enlarged. Mildly increased right ventricular wall thickness. There is severely elevated pulmonary artery systolic pressure. The estimated right ventricular systolic pressure is [id number] mmHg.  3. Left atrial size was moderately dilated.  4. The mitral valve is grossly normal. Mild mitral valve regurgitation.  5. Tricuspid valve regurgitation is moderate.  6. The aortic valve is tricuspid. Aortic valve regurgitation is not visualized. Mild aortic valve sclerosis is present, with no evidence of aortic valve stenosis. Aortic valve mean gradient measures 5.0 mmHg.  7. The inferior vena cava is dilated in size with <50% respiratory variability, suggesting right atrial pressure of 15 mmHg. Comparison(s): No prior Echocardiogram. FINDINGS  Left Ventricle: Left ventricular ejection fraction, by estimation, is 60 to 65%. The left ventricle has normal function. The left ventricle has no regional wall motion abnormalities. The left ventricular internal cavity size was normal in size. There is  severe concentric left ventricular hypertrophy. The interventricular septum is flattened in systole and diastole, consistent with right ventricular pressure and volume overload. Left ventricular diastolic parameters are consistent with Grade II diastolic dysfunction (pseudonormalization). Right Ventricle: The right ventricular size is mildly enlarged. Mildly increased right ventricular wall thickness. Right ventricular systolic function is mildly reduced. There is severely elevated pulmonary artery systolic pressure. The tricuspid regurgitant velocity is 3.46 m/s, and with an assumed right atrial pressure of 15 mmHg, the estimated right ventricular systolic pressure  is [id number] mmHg. Left Atrium: Left atrial size was moderately dilated. Right Atrium: Right atrial size was normal in size. Pericardium: There is no evidence of pericardial effusion. Mitral Valve: The mitral valve is grossly normal. Mild mitral valve regurgitation. MV peak gradient, 4.0 mmHg. The mean mitral valve gradient is 1.0 mmHg. Tricuspid Valve: The tricuspid valve is grossly normal. Tricuspid valve regurgitation is moderate. Aortic Valve: The aortic valve is tricuspid. There is mild aortic valve annular  calcification. Aortic valve regurgitation is not visualized. Mild aortic valve sclerosis is present, with no evidence of aortic valve stenosis. Aortic valve mean gradient measures 5.0 mmHg. Aortic valve peak gradient measures 9.1 mmHg. Aortic valve area, by VTI measures 1.69 cm. Pulmonic Valve: The pulmonic valve was grossly normal. Pulmonic valve regurgitation is trivial. Aorta: The aortic root is normal in size and structure. Venous: The inferior vena cava is dilated in size with less than 50% respiratory variability, suggesting right atrial pressure of 15 mmHg. IAS/Shunts: No atrial level shunt detected by color flow Doppler.  LEFT VENTRICLE PLAX 2D LVIDd:         4.30 cm  Diastology LVIDs:         2.90 cm  LV e' medial:    3.78 cm/s LV PW:         1.90 cm  LV E/e' medial:  22.3 LV IVS:        1.40 cm  LV e' lateral:   10.10 cm/s LVOT diam:     2.00 cm  LV E/e' lateral: 8.4 LV SV:         63 LV SV Index:   33 LVOT Area:     3.14 cm  RIGHT VENTRICLE RV Basal diam:  4.10 cm RV Mid diam:    3.20 cm LEFT ATRIUM              Index       RIGHT ATRIUM           Index LA diam:        4.30 cm  2.25 cm/m  RA Area:     18.40 cm LA Vol (A2C):   106.0 ml 55.37 ml/m RA Volume:   46.20 ml  24.13 ml/m LA Vol (A4C):   77.1 ml  40.27 ml/m LA Biplane Vol: 90.7 ml  47.38 ml/m  AORTIC VALVE                    PULMONIC VALVE AV Area (Vmax):    1.83 cm     PV Vmax:       0.77 m/s AV Area (Vmean):   1.80 cm     PV Peak  grad:  2.4 mmHg AV Area (VTI):     1.69 cm AV Vmax:           151.00 cm/s AV Vmean:          102.000 cm/s AV VTI:            0.372 m AV Peak Grad:      9.1 mmHg AV Mean Grad:      5.0 mmHg LVOT Vmax:         88.10 cm/s LVOT Vmean:        58.500 cm/s LVOT VTI:          0.200 m LVOT/AV VTI ratio: 0.54  AORTA Ao Root diam: 3.00 cm MITRAL VALVE               TRICUSPID VALVE MV Area (PHT): 2.68 cm    TR Peak grad:   47.9 mmHg MV Area VTI:   1.79 cm    TR Vmax:        346.00 cm/s MV Peak grad:  4.0 mmHg MV Mean grad:  1.0 mmHg    SHUNTS MV Vmax:       1.00 m/s    Systemic VTI:  0.20 m MV Vmean:      51.8 cm/s  Systemic Diam: 2.00 cm MV Decel Time: 283 msec MV E velocity: 84.40 cm/s MV A velocity: 78.40 cm/s MV E/A ratio:  1.08 Rozann Lesches MD Electronically signed by Rozann Lesches MD Signature Date/Time: 04/09/2021/4:11:51 PM    Final    CT HEAD CODE STROKE WO CONTRAST  Result Date: 04/09/2021 CLINICAL DATA:  Code stroke.  Altered mental status EXAM: CT HEAD WITHOUT CONTRAST TECHNIQUE: Contiguous axial images were obtained from the base of the skull through the vertex without intravenous contrast. COMPARISON:  Head CT from yesterday FINDINGS: Brain: New cytotoxic edema in the parasagittal right frontal lobe, ACA distribution. Chronic small vessel ischemia in the hemispheric white matter with chronic basal ganglia lacunar infarcts. Chronic left larger than right cerebellar infarcts. Encephalomalacia in the left frontal lobe at the anterior cranial fossa and anterior parasagittal region, likely also post ischemic. Vascular: No hyperdense vessel or unexpected calcification. Skull: Normal. Negative for fracture or focal lesion. Sinuses/Orbits: Bilateral cataract resection Other: These results were communicated to Dr. Wynetta Emery at 9:40 am on 04/09/2021 by text page via the [facility] messaging system. IMPRESSION: 1. Acute cortical infarct is newly seen in the parasagittal right frontal lobe, ACA branch distribution. 2.  Chronic ischemic injury as described, including remote left ACA territory infarcts. Electronically Signed   By: Jorje Guild M.D.   On: 04/09/2021 09:42   US THORACENTESIS ASP PLEURAL SPACE W/IMG GUIDE  Result Date: 04/09/2021 INDICATION: RIGHT pleural effusion EXAM: ULTRASOUND GUIDED DIAGNOSTIC AND THERAPEUTIC RIGHT THORACENTESIS MEDICATIONS: None. COMPLICATIONS: None immediate. PROCEDURE: An ultrasound guided thoracentesis was thoroughly discussed with the patient and questions answered. The benefits, risks, alternatives and complications were also discussed. The patient understands and wishes to proceed with the procedure. Written consent was obtained. Ultrasound was performed to localize and mark an adequate pocket of fluid in the RIGHT chest. The area was then prepped and draped in the normal sterile fashion. 1% Lidocaine was used for local anesthesia. Under ultrasound guidance a 5 [place] Yueh catheter was introduced. Thoracentesis was performed. The catheter was removed and a dressing applied. FINDINGS: A total of approximately 1 L of yellow RIGHT pleural fluid was removed. Samples were sent to the laboratory as requested by the clinical team. IMPRESSION: Successful ultrasound guided RIGHT thoracentesis yielding 1 L of pleural fluid. Electronically Signed   By: Lavonia Dana M.D.   On: 04/09/2021 14:23     Today   Subjective    Tiffany Valencia today has no headache,no chest abdominal pain,no new weakness tingling or numbness.   Objective   Blood pressure (!) 178/65, pulse 70, temperature (!) 97.5 F (36.4 C), temperature source Oral, resp. rate 17, height '5\' 3"'$  (1.6 m), weight 77 kg, SpO2 96 %.   Intake/Output Summary (Last 24 hours) at 04/19/2021 1053 Last data filed at 04/19/2021 0951 Gross per 24 hour  Intake 50 ml  Output 700 ml  Net -650 ml    Exam  Awake but at time mildly sleepy , will answer all questions, Alert x 2 , No new F.N deficits,   Anton.AT,PERRAL Supple Neck,No  JVD, No cervical lymphadenopathy appriciated.  Symmetrical Chest wall movement, Good air movement bilaterally, CTAB RRR,No Gallops,Rubs or new Murmurs, No Parasternal Heave +ve B.Sounds, Abd Soft, Non tender, No organomegaly appriciated, No rebound -guarding or rigidity. No Cyanosis, Clubbing or edema, No new Rash or bruise   Data Review   CBC w Diff:  Lab Results  Component Value Date   WBC 5.6 04/16/2021   HGB 8.3 (L) 04/16/2021  HCT 28.8 (L) 04/16/2021   PLT 165 04/16/2021   LYMPHOPCT 27 04/08/2021   MONOPCT 6 04/08/2021   EOSPCT 2 04/08/2021   BASOPCT 0 04/08/2021    CMP:  Lab Results  Component Value Date   NA 136 04/16/2021   K 4.8 04/16/2021   CL 105 04/16/2021   CO2 22 04/16/2021   BUN 53 (H) 04/16/2021   CREATININE 7.00 (H) 04/16/2021   PROT 6.7 04/16/2021   ALBUMIN 2.0 (L) 04/16/2021   BILITOT 0.7 04/16/2021   ALKPHOS 63 04/16/2021   AST 23 04/16/2021   ALT 15 04/16/2021  .   Total Time in preparing paper work, data evaluation and todays exam - 76 minutes  Lala Lund M.D on 04/19/2021 at 10:53 AM  [facility]

## 2021-05-01 NOTE — Progress Notes (Signed)
Code stroke 915 call [phone] beeper 926 exam started 928 exam finished 928 images sent to [facility] exam completed [place] called

## 2021-06-07 DEATH — deceased
